# Patient Record
Sex: Male | Born: 1965
Health system: Southern US, Community
[De-identification: ages and names within clinical notes are randomized; demographics above are authoritative.]

## PROBLEM LIST (undated history)

## (undated) DIAGNOSIS — E785 Hyperlipidemia, unspecified: Secondary | ICD-10-CM

## (undated) DIAGNOSIS — I10 Essential (primary) hypertension: Secondary | ICD-10-CM

## (undated) DIAGNOSIS — E119 Type 2 diabetes mellitus without complications: Secondary | ICD-10-CM

## (undated) HISTORY — PX: OTHER SURGICAL HISTORY: SHX169

## (undated) HISTORY — PX: LEG SURGERY: SHX1003

---

## 1999-08-16 ENCOUNTER — Emergency Department (HOSPITAL_COMMUNITY): Admission: EM | Admit: 1999-08-16 | Discharge: 1999-08-16 | Payer: Self-pay | Admitting: Emergency Medicine

## 2000-12-12 ENCOUNTER — Emergency Department (HOSPITAL_COMMUNITY): Admission: EM | Admit: 2000-12-12 | Discharge: 2000-12-12 | Payer: Self-pay | Admitting: Emergency Medicine

## 2000-12-12 ENCOUNTER — Encounter: Payer: Self-pay | Admitting: Emergency Medicine

## 2004-05-31 ENCOUNTER — Ambulatory Visit: Payer: Self-pay | Admitting: Family Medicine

## 2005-06-17 ENCOUNTER — Ambulatory Visit: Payer: Self-pay | Admitting: Family Medicine

## 2006-08-07 ENCOUNTER — Emergency Department (HOSPITAL_COMMUNITY): Admission: EM | Admit: 2006-08-07 | Discharge: 2006-08-07 | Payer: Self-pay | Admitting: Emergency Medicine

## 2008-04-22 ENCOUNTER — Emergency Department (HOSPITAL_COMMUNITY): Admission: EM | Admit: 2008-04-22 | Discharge: 2008-04-22 | Payer: Self-pay | Admitting: Emergency Medicine

## 2009-04-11 ENCOUNTER — Emergency Department (HOSPITAL_COMMUNITY): Admission: EM | Admit: 2009-04-11 | Discharge: 2009-04-11 | Payer: Self-pay | Admitting: Emergency Medicine

## 2009-04-24 ENCOUNTER — Encounter: Payer: Self-pay | Admitting: Emergency Medicine

## 2009-04-24 ENCOUNTER — Encounter (INDEPENDENT_AMBULATORY_CARE_PROVIDER_SITE_OTHER): Payer: Self-pay | Admitting: Cardiovascular Disease

## 2009-04-24 ENCOUNTER — Observation Stay (HOSPITAL_COMMUNITY): Admission: AD | Admit: 2009-04-24 | Discharge: 2009-04-25 | Payer: Self-pay | Admitting: Cardiovascular Disease

## 2009-07-06 ENCOUNTER — Emergency Department (HOSPITAL_COMMUNITY): Admission: EM | Admit: 2009-07-06 | Discharge: 2009-07-06 | Payer: Self-pay | Admitting: Emergency Medicine

## 2009-07-08 ENCOUNTER — Emergency Department (HOSPITAL_COMMUNITY): Admission: EM | Admit: 2009-07-08 | Discharge: 2009-07-08 | Payer: Self-pay | Admitting: Family Medicine

## 2009-09-30 ENCOUNTER — Emergency Department (HOSPITAL_COMMUNITY): Admission: EM | Admit: 2009-09-30 | Discharge: 2009-09-30 | Payer: Self-pay | Admitting: Emergency Medicine

## 2010-10-18 LAB — POCT RAPID STREP A (OFFICE): Streptococcus, Group A Screen (Direct): NEGATIVE

## 2010-10-21 LAB — TROPONIN I

## 2010-10-21 LAB — COMPREHENSIVE METABOLIC PANEL WITH GFR
ALT: 36 U/L (ref 0–53)
AST: 50 U/L — ABNORMAL HIGH (ref 0–37)
Albumin: 4 g/dL (ref 3.5–5.2)
Alkaline Phosphatase: 74 U/L (ref 39–117)
BUN: 14 mg/dL (ref 6–23)
CO2: 27 meq/L (ref 19–32)
Calcium: 9.5 mg/dL (ref 8.4–10.5)
Chloride: 96 meq/L (ref 96–112)
Creatinine, Ser: 0.89 mg/dL (ref 0.4–1.5)
GFR calc non Af Amer: >60 mL/min
Glucose, Bld: 122 mg/dL — ABNORMAL HIGH (ref 70–99)
Potassium: 3.6 meq/L (ref 3.5–5.1)
Sodium: 134 meq/L — ABNORMAL LOW (ref 135–145)
Total Bilirubin: 0.9 mg/dL (ref 0.3–1.2)
Total Protein: 8.2 g/dL (ref 6.0–8.3)

## 2010-10-21 LAB — CARDIAC PANEL(CRET KIN+CKTOT+MB+TROPI)
CK, MB: 3.1 ng/mL (ref 0.3–4.0)
CK, MB: 4.1 ng/mL — ABNORMAL HIGH (ref 0.3–4.0)
Relative Index: 0.6 (ref 0.0–2.5)
Relative Index: 0.6 (ref 0.0–2.5)
Total CK: 553 U/L — ABNORMAL HIGH (ref 7–232)
Total CK: 648 U/L — ABNORMAL HIGH (ref 7–232)

## 2010-10-21 LAB — COMPREHENSIVE METABOLIC PANEL
ALT: 33 U/L (ref 0–53)
AST: 40 U/L — ABNORMAL HIGH (ref 0–37)
CO2: 31 mEq/L (ref 19–32)
Chloride: 99 mEq/L (ref 96–112)
Creatinine, Ser: 0.95 mg/dL (ref 0.4–1.5)
GFR calc Af Amer: >60 mL/min (ref >60)
GFR calc non Af Amer: >60 mL/min (ref >60)
Sodium: 139 mEq/L (ref 135–145)
Total Bilirubin: 1 mg/dL (ref 0.3–1.2)

## 2010-10-21 LAB — CK TOTAL AND CKMB (NOT AT ARMC)
CK, MB: 5.3 ng/mL — ABNORMAL HIGH (ref 0.3–4.0)
CK, MB: 7.3 ng/mL — ABNORMAL HIGH (ref 0.3–4.0)
Relative Index: 0.7 (ref 0.0–2.5)
Relative Index: 0.7 (ref 0.0–2.5)
Total CK: 1002 U/L — ABNORMAL HIGH (ref 7–232)
Total CK: 783 U/L — ABNORMAL HIGH (ref 7–232)

## 2010-10-21 LAB — LIPID PANEL
HDL: 66 mg/dL
Total CHOL/HDL Ratio: 3.7 ratio
Triglycerides: 99 mg/dL
VLDL: 20 mg/dL (ref 0–40)

## 2010-10-21 LAB — CBC
HCT: 43.1 % (ref 39.0–52.0)
Hemoglobin: 14.4 g/dL (ref 13.0–17.0)
MCHC: 33.5 g/dL (ref 30.0–36.0)
MCV: 79.3 fL (ref 78.0–100.0)
Platelets: 224 10*3/uL (ref 150–400)
Platelets: 277 10*3/uL (ref 150–400)
RBC: 5.43 MIL/uL (ref 4.22–5.81)
RDW: 15.2 % (ref 11.5–15.5)
RDW: 14.9 % (ref 11.5–15.5)
WBC: 6 10*3/uL (ref 4.0–10.5)

## 2010-10-21 LAB — HEMOGLOBIN A1C
Hgb A1c MFr Bld: 5.7 % (ref 4.6–6.1)
Mean Plasma Glucose: 117 mg/dL

## 2010-10-21 LAB — RAPID URINE DRUG SCREEN, HOSP PERFORMED
Amphetamines: NOT DETECTED
Barbiturates: NOT DETECTED
Benzodiazepines: NOT DETECTED
Cocaine: NOT DETECTED
Opiates: NOT DETECTED
Tetrahydrocannabinol: NOT DETECTED

## 2010-10-21 LAB — URINALYSIS, ROUTINE W REFLEX MICROSCOPIC
Glucose, UA: NEGATIVE mg/dL
Hgb urine dipstick: NEGATIVE
Leukocytes, UA: NEGATIVE
pH: 6 (ref 5.0–8.0)

## 2010-10-21 LAB — POCT CARDIAC MARKERS
CKMB, poc: 10.4 ng/mL (ref 1.0–8.0)
Myoglobin, poc: 175 ng/mL (ref 12–200)
Troponin i, poc: <0.05 ng/mL (ref 0.00–0.09)

## 2010-10-21 LAB — URINE MICROSCOPIC-ADD ON

## 2010-10-21 LAB — MAGNESIUM: Magnesium: 1.8 mg/dL (ref 1.5–2.5)

## 2010-10-21 LAB — DIFFERENTIAL
Basophils Relative: 1 % (ref 0–1)
Eosinophils Absolute: 0.1 10*3/uL (ref 0.0–0.7)
Eosinophils Relative: 1 % (ref 0–5)
Lymphocytes Relative: 40 % (ref 12–46)
Monocytes Absolute: 0.8 10*3/uL (ref 0.1–1.0)
Monocytes Relative: 10 % (ref 3–12)
Neutro Abs: 3.6 10*3/uL (ref 1.7–7.7)

## 2010-10-21 LAB — PROTIME-INR
INR: 0.93 (ref 0.00–1.49)
Prothrombin Time: 12.4 s (ref 11.6–15.2)

## 2011-03-28 ENCOUNTER — Inpatient Hospital Stay (INDEPENDENT_AMBULATORY_CARE_PROVIDER_SITE_OTHER)
Admission: RE | Admit: 2011-03-28 | Discharge: 2011-03-28 | Disposition: A | Discharge status: Home / Self Care | Payer: Self-pay | Source: Ambulatory Visit | Attending: Family Medicine | Admitting: Family Medicine

## 2011-03-28 DIAGNOSIS — T7840XA Allergy, unspecified, initial encounter: Secondary | ICD-10-CM

## 2011-04-18 LAB — DIFFERENTIAL
Basophils Absolute: 0
Basophils Relative: 0
Eosinophils Absolute: 0
Eosinophils Relative: 0
Lymphocytes Relative: 24
Lymphs Abs: 1.2
Monocytes Absolute: 0.5
Monocytes Relative: 11
Neutro Abs: 3.1
Neutrophils Relative %: 64

## 2011-04-18 LAB — COMPREHENSIVE METABOLIC PANEL
ALT: 84 — ABNORMAL HIGH
AST: 149 — ABNORMAL HIGH
Albumin: 3.5
Alkaline Phosphatase: 62
BUN: 10
CO2: 29
Calcium: 8.9
Chloride: 103
Creatinine, Ser: 0.87
GFR calc Af Amer: >60
GFR calc non Af Amer: >60
Glucose, Bld: 90
Potassium: 4.4
Sodium: 139
Total Bilirubin: 1.1
Total Protein: 6.7

## 2011-04-18 LAB — CBC
HCT: 42.3
Hemoglobin: 14.2
MCHC: 33.5
MCV: 80.6
Platelets: 193
RBC: 5.25
RDW: 15.2
WBC: 4.8

## 2011-04-18 LAB — LIPASE, BLOOD: Lipase: 18

## 2012-06-26 ENCOUNTER — Emergency Department (INDEPENDENT_AMBULATORY_CARE_PROVIDER_SITE_OTHER): Payer: Self-pay

## 2012-06-26 ENCOUNTER — Encounter (HOSPITAL_COMMUNITY): Payer: Self-pay | Admitting: Emergency Medicine

## 2012-06-26 ENCOUNTER — Emergency Department (INDEPENDENT_AMBULATORY_CARE_PROVIDER_SITE_OTHER)
Admission: EM | Admit: 2012-06-26 | Discharge: 2012-06-26 | Disposition: A | Discharge status: Home / Self Care | Payer: Self-pay | Source: Home / Self Care | Attending: Emergency Medicine | Admitting: Emergency Medicine

## 2012-06-26 DIAGNOSIS — J189 Pneumonia, unspecified organism: Secondary | ICD-10-CM

## 2012-06-26 HISTORY — DX: Essential (primary) hypertension: I10

## 2012-06-26 MED ORDER — CEFTRIAXONE SODIUM 1 G IJ SOLR
INTRAMUSCULAR | Status: AC
  Filled 2012-06-26: qty 10

## 2012-06-26 MED ORDER — CEFTRIAXONE SODIUM 1 G IJ SOLR
1.0000 g | Freq: Once | INTRAMUSCULAR | Status: AC
  Administered 2012-06-26: 1 g via INTRAMUSCULAR

## 2012-06-26 MED ORDER — AZITHROMYCIN 250 MG PO TABS: ORAL_TABLET | ORAL | Status: DC

## 2012-06-26 MED ORDER — BENZONATATE 200 MG PO CAPS: 200.0000 mg | ORAL_CAPSULE | Freq: Three times a day (TID) | ORAL | Status: DC | PRN

## 2012-06-26 MED ORDER — ACETAMINOPHEN 325 MG PO TABS
ORAL_TABLET | ORAL | Status: AC
  Filled 2012-06-26: qty 2

## 2012-06-26 NOTE — ED Notes (Signed)
Pt  Reports  Symptoms  Of  Fever    Body  Aches     As   Well  As       Chills   Shortness of  Breath and  A  Cough     -  Pt  Reports  Symptoms  X     3  Days    Pt   Took  Some  theraflu   This  Am       Pt  has  A  History  Of  htn            Yet  Takes  No  meds          Out of  [address]   In  [place]  Recently         And  He  Thinks  He  May  Have  Got it from them

## 2012-06-26 NOTE — ED Provider Notes (Signed)
Chief Complaint  Patient presents with  . Fever    History of Present Illness:   Riley Hood is a 45 year old male who has had a three-day history of fever, chills, myalgias, and cough productive yellow sputum. He feels somewhat short of breath, wheezing, tightness in his chest, and chest pain he also has had sore throat and some posttussive vomiting of mucus. He was exposed to a relative in [place] who had similar symptoms. He denies any nasal congestion or rhinorrhea. He has had no prior history of pneumonia.  Review of Systems:  Other than noted above, the patient denies any of the following symptoms. Systemic:  No fever, chills, sweats, fatigue, myalgias, headache, or anorexia. Eye:  No redness, pain or drainage. ENT:  No earache, ear congestion, nasal congestion, sneezing, rhinorrhea, sinus pressure, sinus pain, post nasal drip, or sore throat. Lungs:  No cough, sputum production, wheezing, shortness of breath, or chest pain. GI:  No abdominal pain, nausea, vomiting, or diarrhea.  PMFSH:  Past medical history, family history, social history, meds, and allergies were reviewed.  Physical Exam:   Vital signs:  BP 150/107  Pulse 120  Temp 102.9 F (39.4 C) (Oral)  Resp 30  SpO2 92% General:  Alert, in no distress. Eye:  No conjunctival injection or drainage. Lids were normal. ENT:  TMs and canals were normal, without erythema or inflammation.  Nasal mucosa was clear and uncongested, without drainage.  Mucous membranes were moist.  Pharynx was clear, without exudate or drainage.  There were no oral ulcerations or lesions. Neck:  Supple, no adenopathy, tenderness or mass. Lungs:  No respiratory distress.  Lungs were clear to auscultation, without wheezes, rales or rhonchi.  Breath sounds were clear and equal bilaterally.  Heart:  Regular rhythm, without gallops, murmers or rubs. Skin:  Clear, warm, and dry, without rash or lesions.  Radiology:  Dg Chest 2 View  06/26/2012  *RADIOLOGY  REPORT*  Clinical Data: Productive cough and fever  CHEST - 2 VIEW  Comparison: July 08, 2009.  Findings:  There is patchy infiltrate in the right middle lobe. There is also a small amount of infiltrate in the lingula. Elsewhere lungs clear.  Heart size and pulmonary vascularity normal.  No adenopathy.  No bone lesions.  IMPRESSION: Patchy infiltrate in the right middle lobe.  Small amount of patchy infiltrate in the lingula.   Original Report Authenticated By: Bretta Bang, M.D.    I reviewed the images independently and personally and concur with the radiologist's findings.  Assessment:  The encounter diagnosis was Community acquired pneumonia.  Plan:   1.  The following meds were prescribed:   New Prescriptions   AZITHROMYCIN (ZITHROMAX Z-PAK) 250 MG TABLET    Take as directed.   BENZONATATE (TESSALON) 200 MG CAPSULE    Take 1 capsule (200 mg total) by mouth 3 (three) times daily as needed for cough.   2.  The patient was instructed in symptomatic care and handouts were given. 3.  The patient was told to return if becoming worse in any way, for a recheck in 48 hours, and given some red flag symptoms that would indicate earlier return.   Reuben Likes, MD 06/26/12 1556

## 2015-05-25 ENCOUNTER — Encounter (HOSPITAL_COMMUNITY): Payer: Self-pay

## 2015-05-25 ENCOUNTER — Observation Stay (HOSPITAL_COMMUNITY)
Admission: EM | Admit: 2015-05-25 | Discharge: 2015-05-27 | Disposition: A | Discharge status: Home / Self Care | Payer: Self-pay | Attending: Cardiovascular Disease | Admitting: Cardiovascular Disease

## 2015-05-25 ENCOUNTER — Emergency Department (HOSPITAL_COMMUNITY): Payer: Self-pay

## 2015-05-25 ENCOUNTER — Encounter (HOSPITAL_COMMUNITY): Payer: Self-pay | Admitting: *Deleted

## 2015-05-25 ENCOUNTER — Emergency Department (INDEPENDENT_AMBULATORY_CARE_PROVIDER_SITE_OTHER)
Admission: EM | Admit: 2015-05-25 | Discharge: 2015-05-25 | Disposition: A | Discharge status: Home / Self Care | Payer: Self-pay | Source: Home / Self Care

## 2015-05-25 DIAGNOSIS — Z8249 Family history of ischemic heart disease and other diseases of the circulatory system: Secondary | ICD-10-CM | POA: Insufficient documentation

## 2015-05-25 DIAGNOSIS — E785 Hyperlipidemia, unspecified: Secondary | ICD-10-CM | POA: Insufficient documentation

## 2015-05-25 DIAGNOSIS — I10 Essential (primary) hypertension: Secondary | ICD-10-CM

## 2015-05-25 DIAGNOSIS — R7303 Prediabetes: Secondary | ICD-10-CM | POA: Insufficient documentation

## 2015-05-25 DIAGNOSIS — R079 Chest pain, unspecified: Secondary | ICD-10-CM

## 2015-05-25 DIAGNOSIS — R011 Cardiac murmur, unspecified: Secondary | ICD-10-CM | POA: Insufficient documentation

## 2015-05-25 DIAGNOSIS — I160 Hypertensive urgency: Secondary | ICD-10-CM

## 2015-05-25 LAB — BASIC METABOLIC PANEL
ANION GAP: 9 (ref 5–15)
BUN: 15 mg/dL (ref 6–20)
CHLORIDE: 102 mmol/L (ref 101–111)
CO2: 27 mmol/L (ref 22–32)
Calcium: 9.1 mg/dL (ref 8.9–10.3)
Creatinine, Ser: 0.82 mg/dL (ref 0.61–1.24)
GFR calc Af Amer: >60 mL/min (ref >60)
GLUCOSE: 105 mg/dL — AB (ref 65–99)
POTASSIUM: 3.8 mmol/L (ref 3.5–5.1)
Sodium: 138 mmol/L (ref 135–145)

## 2015-05-25 LAB — CBC WITH DIFFERENTIAL/PLATELET
Basophils Absolute: 0 10*3/uL (ref 0.0–0.1)
Basophils Relative: 1 %
EOS ABS: 0 10*3/uL (ref 0.0–0.7)
Eosinophils Relative: 1 %
HEMATOCRIT: 41.7 % (ref 39.0–52.0)
HEMOGLOBIN: 14.7 g/dL (ref 13.0–17.0)
LYMPHS ABS: 2.3 10*3/uL (ref 0.7–4.0)
Lymphocytes Relative: 42 %
MCH: 25.9 pg — AB (ref 26.0–34.0)
MCHC: 35.3 g/dL (ref 30.0–36.0)
MCV: 73.4 fL — ABNORMAL LOW (ref 78.0–100.0)
Monocytes Absolute: 0.4 10*3/uL (ref 0.1–1.0)
Monocytes Relative: 8 %
NEUTROS ABS: 2.7 10*3/uL (ref 1.7–7.7)
NEUTROS PCT: 48 %
Platelets: 241 10*3/uL (ref 150–400)
RBC: 5.68 MIL/uL (ref 4.22–5.81)
RDW: 15.4 % (ref 11.5–15.5)
WBC: 5.5 10*3/uL (ref 4.0–10.5)

## 2015-05-25 LAB — I-STAT TROPONIN, ED: Troponin i, poc: 0.01 ng/mL (ref 0.00–0.08)

## 2015-05-25 MED ORDER — ONDANSETRON HCL 4 MG/2ML IJ SOLN: 4.0000 mg | Freq: Four times a day (QID) | INTRAMUSCULAR | Status: DC | PRN

## 2015-05-25 MED ORDER — ACETAMINOPHEN 325 MG PO TABS
650.0000 mg | ORAL_TABLET | ORAL | Status: DC | PRN
  Administered 2015-05-26: 650 mg via ORAL
  Filled 2015-05-25: qty 2

## 2015-05-25 MED ORDER — ATORVASTATIN CALCIUM 80 MG PO TABS
80.0000 mg | ORAL_TABLET | Freq: Every day | ORAL | Status: DC
  Administered 2015-05-25 – 2015-05-27 (×3): 80 mg via ORAL
  Filled 2015-05-25 (×3): qty 1

## 2015-05-25 MED ORDER — ASPIRIN 300 MG RE SUPP: 300.0000 mg | RECTAL | Status: AC

## 2015-05-25 MED ORDER — NITROGLYCERIN 0.4 MG SL SUBL: 0.4000 mg | SUBLINGUAL_TABLET | SUBLINGUAL | Status: DC | PRN

## 2015-05-25 MED ORDER — ASPIRIN 81 MG PO CHEW
CHEWABLE_TABLET | ORAL | Status: AC
  Filled 2015-05-25: qty 4

## 2015-05-25 MED ORDER — INFLUENZA VAC SPLIT QUAD 0.5 ML IM SUSY
0.5000 mL | PREFILLED_SYRINGE | INTRAMUSCULAR | Status: AC
  Administered 2015-05-26: 0.5 mL via INTRAMUSCULAR
  Filled 2015-05-25: qty 0.5

## 2015-05-25 MED ORDER — ASPIRIN EC 81 MG PO TBEC
81.0000 mg | DELAYED_RELEASE_TABLET | Freq: Every day | ORAL | Status: DC
  Administered 2015-05-26 – 2015-05-27 (×2): 81 mg via ORAL
  Filled 2015-05-25 (×2): qty 1

## 2015-05-25 MED ORDER — ACETAMINOPHEN 500 MG PO TABS
1000.0000 mg | ORAL_TABLET | Freq: Once | ORAL | Status: AC
  Administered 2015-05-25: 1000 mg via ORAL
  Filled 2015-05-25: qty 2

## 2015-05-25 MED ORDER — SODIUM CHLORIDE 0.9 % IJ SOLN: 3.0000 mL | INTRAMUSCULAR | Status: DC | PRN

## 2015-05-25 MED ORDER — NITROGLYCERIN 0.4 MG SL SUBL
SUBLINGUAL_TABLET | SUBLINGUAL | Status: AC
  Filled 2015-05-25: qty 1

## 2015-05-25 MED ORDER — ASPIRIN 81 MG PO CHEW
324.0000 mg | CHEWABLE_TABLET | Freq: Once | ORAL | Status: AC
  Administered 2015-05-25: 324 mg via ORAL

## 2015-05-25 MED ORDER — SODIUM CHLORIDE 0.9 % IJ SOLN
3.0000 mL | Freq: Two times a day (BID) | INTRAMUSCULAR | Status: DC
  Administered 2015-05-25 – 2015-05-27 (×4): 3 mL via INTRAVENOUS

## 2015-05-25 MED ORDER — ASPIRIN 81 MG PO CHEW: 324.0000 mg | CHEWABLE_TABLET | ORAL | Status: AC

## 2015-05-25 MED ORDER — METOPROLOL TARTRATE 25 MG PO TABS
25.0000 mg | ORAL_TABLET | Freq: Two times a day (BID) | ORAL | Status: DC
  Administered 2015-05-25: 25 mg via ORAL
  Filled 2015-05-25: qty 1

## 2015-05-25 MED ORDER — SODIUM CHLORIDE 0.9 % IV SOLN
Freq: Once | INTRAVENOUS | Status: AC
  Administered 2015-05-25: 19:00:00 via INTRAVENOUS

## 2015-05-25 MED ORDER — NITROGLYCERIN 2 % TD OINT
1.0000 [in_us] | TOPICAL_OINTMENT | Freq: Once | TRANSDERMAL | Status: AC
  Administered 2015-05-25: 1 [in_us] via TOPICAL
  Filled 2015-05-25: qty 1

## 2015-05-25 MED ORDER — HYDRALAZINE HCL 20 MG/ML IJ SOLN
10.0000 mg | Freq: Four times a day (QID) | INTRAMUSCULAR | Status: DC | PRN
  Administered 2015-05-25 – 2015-05-27 (×3): 10 mg via INTRAVENOUS
  Filled 2015-05-25 (×4): qty 1

## 2015-05-25 MED ORDER — ASPIRIN 81 MG PO CHEW
324.0000 mg | CHEWABLE_TABLET | Freq: Once | ORAL | Status: DC
Start: 2015-05-25 — End: 2015-05-25

## 2015-05-25 MED ORDER — SODIUM CHLORIDE 0.9 % IV SOLN: 250.0000 mL | INTRAVENOUS | Status: DC | PRN

## 2015-05-25 NOTE — ED Provider Notes (Signed)
CSN: [id number]     Arrival date & time 05/25/15  1932 History   First MD Initiated Contact with Patient 05/25/15 1946     Chief Complaint  Patient presents with  . Chest Pain     (Consider location/radiation/quality/duration/timing/severity/associated sxs/prior Treatment) HPI  49 year old male presents with chest pain on and off for past 5 days. Started at work, broke out into a cold sweat. Pain is sharp in nature. 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 Is mostly in his left chest. Had shortness of breath once but typically has not been short of breath. No nausea or vomiting. No further sweating. The worse of the pain was over the first couple days, still having pain but has been unable to see a doctor until today. Went to urgent care where he was given 3-4 mg aspirin and one nitroglycerin. Chest pain now completely resolved. Now he has a headache. Has a history of hypertension, not currently on medicines. No leg swelling.   Past Medical History  Diagnosis Date  . Hypertension    History reviewed. No pertinent past surgical history. History reviewed. No pertinent family history. Social History  Substance Use Topics  . Smoking status: Never Smoker   . Smokeless tobacco: None  . Alcohol Use: No    Review of Systems  Constitutional: Positive for diaphoresis. Negative for fever.  Respiratory: Positive for shortness of breath.   Cardiovascular: Positive for chest pain. Negative for leg swelling.  Gastrointestinal: Negative for nausea and vomiting.      Allergies  Review of patient's allergies indicates no known allergies.  Home Medications   Prior to Admission medications   Medication Sig Start Date End Date Taking? Authorizing Provider  azithromycin (ZITHROMAX Z-PAK) 250 MG tablet Take as directed. 06/26/12   Harden Mo, MD  benzonatate (TESSALON) 200 MG capsule Take 1 capsule (200 mg total) by mouth 3 (three) times daily as needed for cough. 06/26/12   Harden Mo, MD   BP 182/93 mmHg  Pulse 80  Temp(Src) 99 F (37.2 C) (Oral)  SpO2 99% Physical Exam  Constitutional: He is oriented to person, place, and time. He appears well-developed and well-nourished. No distress.  HENT:  Head: Normocephalic and atraumatic.  Right Ear: External ear normal.  Left Ear: External ear normal.  Nose: Nose normal.  Eyes: Right eye exhibits no discharge. Left eye exhibits no discharge.  Neck: Neck supple.  Cardiovascular: Normal rate, regular rhythm, normal heart sounds and intact distal pulses.   Pulmonary/Chest: Effort normal and breath sounds normal. He exhibits no tenderness.  Abdominal: Soft. He exhibits no distension. There is no tenderness.  Musculoskeletal: He exhibits no edema.  Neurological: He is alert and oriented to person, place, and time.  Skin: Skin is warm and dry. He is not diaphoretic.  Nursing note and vitals reviewed.   ED Course  Procedures (including critical care time) Labs Review Labs Reviewed  BASIC METABOLIC PANEL - Abnormal; Notable for the following:    Glucose, Bld 105 (*)    All other components within normal limits  CBC WITH DIFFERENTIAL/PLATELET - Abnormal; Notable for the following:    MCV 73.4 (*)    MCH 25.9 (*)    All other components within normal limits  PROTIME-INR  APTT  TSH  TROPONIN I  TROPONIN I  TROPONIN I  HEMOGLOBIN A1C  BRAIN NATRIURETIC PEPTIDE  BASIC METABOLIC PANEL  LIPID PANEL  CBC  I-STAT TROPOININ, ED  I-STAT CHEM 8,  ED    Imaging Review Dg Chest Port 1 View  05/25/2015  CLINICAL DATA:  Left-sided chest pain since Friday. EXAM: PORTABLE CHEST 1 VIEW COMPARISON:  06/26/2012 FINDINGS: The cardiac silhouette, mediastinal and hilar contours are within normal limits and stable given the AP projection and portable technique. Low lung volumes with vascular crowding and streaky atelectasis but no infiltrates, edema or effusions. The bony thorax is intact. IMPRESSION: Low lung volumes with  vascular crowding and streaky atelectasis. No infiltrates, edema or effusions. Electronically Signed   By: Marijo Sanes M.D.   On: 05/25/2015 20:35   I have personally reviewed and evaluated these images and lab results as part of my medical decision-making.   EKG Interpretation   Date/Time:  Monday May 25 2015 19:40:03 EST Ventricular Rate:  70 PR Interval:  191 QRS Duration: 101 QT Interval:  385 QTC Calculation: 415 R Axis:   -83 Text Interpretation:  Sinus rhythm Left anterior fascicular block Abnormal  R-wave progression, late transition ST elevation in AVL only changes noted  compared to earlier in the day Confirmed by Lequisha Cammack  MD, Zaccai Chavarin ([id number]) on  05/26/2015 12:46:29 AM      MDM   Final diagnoses:  Chest pain, unspecified chest pain type    8:05 PM Reviewed ECG with Dr. Irish Lack, who feels while this is is different it is not diagnostic of STEMI, with only 1 lead elevated (AVL) and now pain free. Plan to see what troponin level is. Patient will need admission either way.  Patient's chest pain has not recurred. Initial troponin unremarkable. Difficult to tell given how long his symptoms been going on if this is after an MI or indicated of no cardiac damage. Likely his significant hypertension is playing a role. Has come down some with nitroglycerin by urgent care, Nitropaste applied here. Cardiology, Dr. Claiborne Billings, has seen patient and plans to admit.      Sherwood Gambler, MD 05/26/15 [phone]

## 2015-05-25 NOTE — H&P (Signed)
Riley Hood is an 49 y.o. male.    Chief Complaint: chest pain Primary cardiologist: Riley Hood  HPI: Riley Hood is a 49 yo man with PMH of hypertension who presents with chest pain that has been going on/off since last Friday, 11/4. 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 He characterized the pain as sharp with some pressure. He did notice associated diaphoresis and some mild nausea. He thought it was related to a hamburger and it improved mildly with rolaids. Since then the pain has recurred intermittently over the weekly, more with exertion as he largely rested all weekend. He continued to have discomfort so he sought care. He was seen in urgent care and ECG discussed with Dr. Irish Hood. He had an ECG change with some subtle ST depression and isolated 0.5 mm ST elevation discussed with Dr. Irish Hood (interventionalist on call) but given Hood of concomitant chest pain and nondiagnostic ECG changes no code STEMI activated. He is currently chest pain free. His pain resolved very quickly after SL NTG. He then developed a slight headache. He was also noted to have hypertension. He denies recent weight loss/gain. No upcoming surgeries. No dark stools or bright red blood per rectum. He does note some intermittent diarrhea.  Never smoker. Mother still alive with CAD. Father deceased but no CAD known.  Past Medical History  Diagnosis Date  . Hypertension     History reviewed. No pertinent past surgical history. Family history: no known CAD History reviewed. No pertinent family history. Social History:  reports that he has never smoked. He does not have any smokeless tobacco history on file. He reports that he does not drink alcohol. His drug history is not on file.  Allergies: No Known Allergies   (Not in a hospital admission)  Results for orders placed or performed during the hospital encounter of 05/25/15 (from the past 48 hour(s))  CBC with Differential      Status: Abnormal   Collection Time: 05/25/15  8:17 PM  Result Value Ref Range   WBC 5.5 4.0 - 10.5 K/uL   RBC 5.68 4.22 - 5.81 MIL/uL   Hemoglobin 14.7 13.0 - 17.0 g/dL   HCT 41.7 39.0 - 52.0 %   MCV 73.4 (L) 78.0 - 100.0 fL   MCH 25.9 (L) 26.0 - 34.0 pg   MCHC 35.3 30.0 - 36.0 g/dL   RDW 15.4 11.5 - 15.5 %   Platelets 241 150 - 400 K/uL   Neutrophils Relative % 48 %   Neutro Abs 2.7 1.7 - 7.7 K/uL   Lymphocytes Relative 42 %   Lymphs Abs 2.3 0.7 - 4.0 K/uL   Monocytes Relative 8 %   Monocytes Absolute 0.4 0.1 - 1.0 K/uL   Eosinophils Relative 1 %   Eosinophils Absolute 0.0 0.0 - 0.7 K/uL   Basophils Relative 1 %   Basophils Absolute 0.0 0.0 - 0.1 K/uL  I-stat troponin, ED     Status: None   Collection Time: 05/25/15  8:21 PM  Result Value Ref Range   Troponin i, poc 0.01 0.00 - 0.08 ng/mL   Comment 3            Comment: Due to the release kinetics of cTnI, a negative result within the first hours of the onset of symptoms does not rule out myocardial infarction with certainty. If myocardial infarction is still suspected, repeat the test at appropriate intervals.    Dg Chest Port 1 View  05/25/2015  CLINICAL DATA:  Left-sided chest pain since Friday. EXAM: PORTABLE CHEST 1 VIEW COMPARISON:  06/26/2012 FINDINGS: The cardiac silhouette, mediastinal and hilar contours are within normal limits and stable given the AP projection and portable technique. Low lung volumes with vascular crowding and streaky atelectasis but no infiltrates, edema or effusions. The bony thorax is intact. IMPRESSION: Low lung volumes with vascular crowding and streaky atelectasis. No infiltrates, edema or effusions. Electronically Signed   By: Marijo Sanes M.D.   On: 05/25/2015 20:35    Review of Systems  Constitutional: Positive for diaphoresis. Negative for fever, chills, weight loss and malaise/fatigue.  HENT: Negative for ear pain and tinnitus.   Eyes: Negative for double vision, photophobia and  discharge.  Respiratory: Negative for cough, hemoptysis and sputum production.   Cardiovascular: Positive for chest pain. Negative for orthopnea, claudication, leg swelling and PND.  Gastrointestinal: Positive for heartburn, nausea and diarrhea. Negative for vomiting, abdominal pain, constipation, blood in stool and melena.  Genitourinary: Negative for dysuria, frequency and hematuria.  Musculoskeletal: Negative for myalgias, joint pain and neck pain.  Skin: Negative for rash.  Neurological: Negative for dizziness, tingling, tremors and sensory change.  Endo/Heme/Allergies: Negative for polydipsia. Does not bruise/bleed easily.  Psychiatric/Behavioral: Negative for suicidal ideas, hallucinations and substance abuse.    Blood pressure 190/101, pulse 88, temperature 97.8 F (36.6 C), temperature source Oral, resp. rate 24, SpO2 97 %. Physical Exam  Nursing note and vitals reviewed. Constitutional: He is oriented to person, place, and time. He appears well-developed and well-nourished. No distress.  HENT:  Head: Normocephalic and atraumatic.  Nose: Nose normal.  Mouth/Throat: Oropharynx is clear and moist. No oropharyngeal exudate.  Eyes: Conjunctivae and EOM are normal. Pupils are equal, round, and reactive to light. No scleral icterus.  Neck: Normal range of motion. Neck supple. No JVD present. No tracheal deviation present.  Cardiovascular: Normal rate, regular rhythm and intact distal pulses.  Exam reveals no gallop and no friction rub.   Murmur heard. Soft systolic murmur noted at LLSB and apex  Respiratory: Effort normal and breath sounds normal. No respiratory distress. He has no wheezes. He has no rales.  GI: Soft. Bowel sounds are normal. He exhibits no distension. There is no tenderness. There is no rebound.  Musculoskeletal: Normal range of motion. He exhibits no edema or tenderness.  Neurological: He is alert and oriented to person, place, and time. No cranial nerve deficit.  Coordination normal.  Skin: Skin is warm and dry. No rash noted. He is not diaphoretic. No erythema.  Psychiatric: He has a normal mood and affect. His behavior is normal. Thought content normal.    Labs pending; h/h 14.7/41.7, wbc 5.5, plt 241, top 0.01 EKG #1 reviewed; SR, LAE, LAFB, LVH with repolarization #2 EKG reviewed; SR, sub mm (0.5) in aVL, otherwise, SR, LAE, LVH with repolarization and some subtle lateral/inferior TWI Echo: 10/10: EF 60-65%, grade I DD, mild AI, mild TR, mild LVH  Assessment/Plan Mr. Eschmann is a 49 yo man with PMH of hypertension who presents with 4-5 days of intermittent chest discomfort. Differential diagnosis is musculoskeletal pain, esophageal spasm, GERD, pericarditis, ACS/NSTEMI among other etiologies. I favor a diagnosis of typical chest pain with exertional chest pain that improved with SL NTG and some component of pressure (also sharp). He is currently chest pain free in setting of hypertension. At this time, will not start heparin given 4-5 days of chest pain intermittently, currently chest pain free and negative troponin. If recurrent pain, will  start heparin gtt. Will aim to control blood pressure as first target. I favor LHC in AM given abnormal ECG and typical features of chest pain.  Problem List Chest Pain Hypertensive urgency Hypertension Plan: - NPO after MN - trend cardiac markers - anticipate LHC in AM, discussed with pain - observation on telemetry - asa 81 mg, atorvastatin 80 mg qHS, first dose now - control blood pressure, NTG and metoprolol, hydralazine PRN - hba1c, tsh, lipid panel, BNP - echocardiogram   Bennie Scaff 05/25/2015, 8:51 PM

## 2015-05-25 NOTE — ED Notes (Signed)
Called lab to get update on results, stated machine was down and would be a while

## 2015-05-25 NOTE — Progress Notes (Signed)
PATIENT ARRIVED TO UNIT 2W FROM E.D. VIA STRETCHER. AMBULATED TO BED. TELE APPLIED. VITALS OBTAINED. ASSESSMENT COMPLETED.  PATIENT ORIENTED TO UNIT AND EQUIPMENT. INSTRUCTED TO CALL FOR ASSISTANCE WHEN NEEDED. 

## 2015-05-25 NOTE — ED Notes (Signed)
Per [facility]: Pt coming from Urgent Care, began experiencing L sided chest pain Friday. Pt states sharp pain. UC gave 325 ASA and 1 nitro. [facility] gave a second nitro. Pt reports pain-free at this time. BP 190/86, HR 70, 98% RA. Hx: htn, no medications.

## 2015-05-25 NOTE — ED Notes (Signed)
Pt reports    Chest  Pain  Cardiac  Monitor  And   Nasal  o2   At  2  l  /  Min         Iv  Ns  tko  20  Angio  r  Arm

## 2015-05-25 NOTE — ED Provider Notes (Signed)
CSN: [id number]     Arrival date & time 05/25/15  1738 History   None    Chief Complaint  Patient presents with  . Chest Pain   (Consider location/radiation/quality/duration/timing/severity/associated sxs/prior Treatment) Patient is a 49 y.o. male presenting with chest pain. The history is provided by the patient.  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 Hypertension    History reviewed. No pertinent past surgical history. History reviewed. No pertinent family history. Social History  Substance Use Topics  . Smoking status: Never Smoker   . Smokeless tobacco: None  . Alcohol Use: No    Review of Systems  Constitutional: Positive for diaphoresis.  Respiratory: Negative for shortness of breath.   Cardiovascular: Positive for chest pain. Negative for palpitations and leg swelling.  Gastrointestinal: Positive for nausea. Negative for heartburn, vomiting and abdominal pain.  All other systems reviewed and are negative.   Allergies  Review of patient's allergies indicates no known allergies.  Home Medications   Prior to Admission medications   Medication Sig Start Date End Date Taking? Authorizing Provider  azithromycin (ZITHROMAX Z-PAK) 250 MG tablet Take as directed. 06/26/12   Harden Mo, MD  benzonatate (TESSALON) 200 MG capsule Take 1 capsule (200 mg total) by mouth 3 (three) times  daily as needed for cough. 06/26/12   Harden Mo, MD   Meds Ordered and Administered this Visit   Medications  aspirin chewable tablet 324 mg (not administered)  0.9 %  sodium chloride infusion (not administered)  aspirin chewable tablet 324 mg (324 mg Oral Given 05/25/15 1900)    BP 185/106 mmHg  Pulse 86  Temp(Src) 98.4 F (36.9 C) (Oral)  SpO2 98% No data found.   Physical Exam  Constitutional: He is oriented to person, place, and time. He appears well-developed and well-nourished.  Eyes: Conjunctivae are normal. Pupils are equal, round, and reactive to light.  Neck: Normal range of motion. Neck supple.  Cardiovascular: Normal rate, regular rhythm, intact distal pulses and normal pulses.   Murmur heard.  Systolic murmur is present with a grade of 3/6  Pulmonary/Chest: Effort normal and breath sounds normal.  Neurological: He is alert and oriented to person, place, and time.  Skin: Skin is warm and dry.  Nursing note and vitals reviewed.   ED Course  Procedures (including critical care time)  Labs Review Labs Reviewed - No data to display ED ECG REPORT   Date: 05/25/2015  Rate:86  Rhythm: normal sinus rhythm  QRS Axis: left  Intervals: normal  ST/T Wave abnormalities: nonspecific ST/T changes, ST depressions laterally and early repolarization  Conduction Disutrbances:none and left anterior fascicular block  Narrative Interpretation:   Old EKG Reviewed: none available  I have personally reviewed the EKG tracing and agree with the computerized printout as noted.  Imaging Review No results found.   Visual Acuity Review  Right Eye  Distance:   Left Eye Distance:   Bilateral Distance:    Right Eye Near:   Left Eye Near:    Bilateral Near:         MDM   1. Chest pain with high risk for cardiac etiology    Discussed with dr Irish Lack who reviewed ekg and does not feel code stemi but does warrant enzyme eval. Will send to ER., will give asa and  ntg.    Billy Fischer, MD 05/25/15 Drema Halon

## 2015-05-25 NOTE — ED Notes (Addendum)
Pt  Reports chest   Pain  X  3  Days        Short  Of  Breath  Earlier            Had    Chest  Pain   Has   A  History         Of       Sleep apnea    And  High  Cholesterol

## 2015-05-26 ENCOUNTER — Observation Stay (HOSPITAL_BASED_OUTPATIENT_CLINIC_OR_DEPARTMENT_OTHER): Payer: Self-pay

## 2015-05-26 ENCOUNTER — Ambulatory Visit (HOSPITAL_BASED_OUTPATIENT_CLINIC_OR_DEPARTMENT_OTHER): Payer: Self-pay

## 2015-05-26 DIAGNOSIS — R079 Chest pain, unspecified: Secondary | ICD-10-CM

## 2015-05-26 DIAGNOSIS — I160 Hypertensive urgency: Secondary | ICD-10-CM

## 2015-05-26 DIAGNOSIS — I10 Essential (primary) hypertension: Secondary | ICD-10-CM

## 2015-05-26 LAB — BRAIN NATRIURETIC PEPTIDE: B Natriuretic Peptide: 18.1 pg/mL (ref 0.0–100.0)

## 2015-05-26 LAB — PROTIME-INR
INR: 1.12 (ref 0.00–1.49)
PROTHROMBIN TIME: 14.5 s (ref 11.6–15.2)

## 2015-05-26 LAB — BASIC METABOLIC PANEL WITH GFR
Anion gap: 9 (ref 5–15)
BUN: 11 mg/dL (ref 6–20)
CO2: 28 mmol/L (ref 22–32)
Calcium: 9 mg/dL (ref 8.9–10.3)
Chloride: 99 mmol/L — ABNORMAL LOW (ref 101–111)
Creatinine, Ser: 0.82 mg/dL (ref 0.61–1.24)
GFR calc Af Amer: >60 mL/min
GFR calc non Af Amer: >60 mL/min
Glucose, Bld: 115 mg/dL — ABNORMAL HIGH (ref 65–99)
Potassium: 3.8 mmol/L (ref 3.5–5.1)
Sodium: 136 mmol/L (ref 135–145)

## 2015-05-26 LAB — TSH: TSH: 1.144 u[IU]/mL (ref 0.350–4.500)

## 2015-05-26 LAB — CBC
HCT: 40.1 % (ref 39.0–52.0)
Hemoglobin: 14 g/dL (ref 13.0–17.0)
MCH: 25.4 pg — ABNORMAL LOW (ref 26.0–34.0)
MCHC: 34.9 g/dL (ref 30.0–36.0)
MCV: 72.8 fL — ABNORMAL LOW (ref 78.0–100.0)
Platelets: 242 10*3/uL (ref 150–400)
RBC: 5.51 MIL/uL (ref 4.22–5.81)
RDW: 15.3 % (ref 11.5–15.5)
WBC: 5.4 10*3/uL (ref 4.0–10.5)

## 2015-05-26 LAB — LIPID PANEL
Cholesterol: 212 mg/dL — ABNORMAL HIGH (ref 0–200)
HDL: 43 mg/dL
LDL Cholesterol: UNDETERMINED mg/dL (ref 0–99)
Total CHOL/HDL Ratio: 4.9 ratio
Triglycerides: 772 mg/dL — ABNORMAL HIGH
VLDL: UNDETERMINED mg/dL (ref 0–40)

## 2015-05-26 LAB — TROPONIN I
Troponin I: <0.03 ng/mL
Troponin I: <0.03 ng/mL (ref <0.031)
Troponin I: <0.03 ng/mL

## 2015-05-26 LAB — APTT: aPTT: 29 s (ref 24–37)

## 2015-05-26 LAB — D-DIMER, QUANTITATIVE (NOT AT ARMC): D DIMER QUANT: 0.39 ug{FEU}/mL (ref 0.00–0.48)

## 2015-05-26 MED ORDER — REGADENOSON 0.4 MG/5ML IV SOLN
0.4000 mg | Freq: Once | INTRAVENOUS | Status: AC
  Administered 2015-05-26: 0.4 mg via INTRAVENOUS
  Filled 2015-05-26: qty 5

## 2015-05-26 MED ORDER — REGADENOSON 0.4 MG/5ML IV SOLN
INTRAVENOUS | Status: AC
  Administered 2015-05-26: 0.4 mg via INTRAVENOUS
  Filled 2015-05-26: qty 5

## 2015-05-26 MED ORDER — AMLODIPINE BESYLATE 10 MG PO TABS
10.0000 mg | ORAL_TABLET | Freq: Every day | ORAL | Status: DC
  Administered 2015-05-27: 10 mg via ORAL
  Filled 2015-05-26: qty 1

## 2015-05-26 MED ORDER — METOPROLOL TARTRATE 50 MG PO TABS
50.0000 mg | ORAL_TABLET | Freq: Two times a day (BID) | ORAL | Status: DC
  Administered 2015-05-26 – 2015-05-27 (×4): 50 mg via ORAL
  Filled 2015-05-26 (×4): qty 1

## 2015-05-26 MED ORDER — REGADENOSON 0.4 MG/5ML IV SOLN
0.4000 mg | Freq: Once | INTRAVENOUS | Status: AC
  Administered 2015-05-26: 0.4 mg via INTRAVENOUS

## 2015-05-26 MED ORDER — LABETALOL HCL 5 MG/ML IV SOLN: 10.0000 mg | INTRAVENOUS | Status: DC | PRN

## 2015-05-26 MED ORDER — AMLODIPINE BESYLATE 5 MG PO TABS
5.0000 mg | ORAL_TABLET | Freq: Every day | ORAL | Status: DC
  Administered 2015-05-26: 5 mg via ORAL
  Filled 2015-05-26: qty 1

## 2015-05-26 NOTE — Progress Notes (Signed)
Nitro patch removed in IR for [employer] at 1135.   Babs Bertin RN

## 2015-05-26 NOTE — Care Management Note (Addendum)
Case Management Note  Patient Details  Name: Riley Hood MRN: [id number] Date of Birth: Jul 18, 1966  Subjective/Objective:  Pt was admitted with CP                  Action/Plan:  Pt is independent from home.  Pt confirmed he does not have insurance nor PCP, pt agreed for CM to set up initial appt with PCP.   Expected Discharge Date:  05/27/15               Expected Discharge Plan:  Home/Self Care  In-House Referral:     Discharge planning Services  CM Consult, [facility]  Post Acute Care Choice:    Choice offered to:     DME Arranged:    DME Agency:     HH Arranged:    HH Agency:     Status of Service:  In process, will continue to follow  Medicare Important Message Given:    Date Medicare IM Given:    Medicare IM give by:    Date Additional Medicare IM Given:    Additional Medicare Important Message give by:     If discussed at [place] of Stay Meetings, dates discussed:    Additional Comments: CM assessed pt; CM set up appt with [facility] for 06/05/15 at 2pm.  CM provided clinic brochure with appt specifics on it, and provided appt info on AVS.  CM informed pt that he will be able to get medications filled immediately post discharge at pharmacy clinic Maryclare Labrador, RN 05/26/2015, 3:59 PM

## 2015-05-26 NOTE — Progress Notes (Signed)
Patient sitting on side of the bed, son and grandchildren present. No pain or distress, call light within reach.

## 2015-05-26 NOTE — Progress Notes (Signed)
Patient's BP only went down from 197/118 to 175/113. Prn dose of hydralazine given, will continue to monitor.Call light within reach
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
## 2015-05-26 NOTE — Progress Notes (Signed)
DR. Claiborne Billings (CARDIOLOGY) NOTIFIED OF PATIENT'S HYPERTENSION AND INTERVENTIONS.

## 2015-05-26 NOTE — Progress Notes (Signed)
  Echocardiogram 2D Echocardiogram has been performed.  Jennette Dubin 05/26/2015, 2:43 PM

## 2015-05-27 ENCOUNTER — Encounter (HOSPITAL_COMMUNITY): Payer: Self-pay

## 2015-05-27 ENCOUNTER — Encounter (HOSPITAL_COMMUNITY): Payer: MEDICAID

## 2015-05-27 LAB — NM MYOCAR MULTI W/SPECT W/WALL MOTION / EF
CHL CUP RESTING HR STRESS: 79 {beats}/min
CSEPED: 5 min
Estimated workload: 1 METS
MPHR: 171 {beats}/min
Peak HR: 107 {beats}/min
Percent HR: 62 %

## 2015-05-27 LAB — HEMOGLOBIN A1C
Hgb A1c MFr Bld: 6.3 % — ABNORMAL HIGH (ref 4.8–5.6)
MEAN PLASMA GLUCOSE: 134 mg/dL

## 2015-05-27 MED ORDER — AMLODIPINE BESYLATE 10 MG PO TABS: 10.0000 mg | ORAL_TABLET | Freq: Every day | ORAL | Status: DC

## 2015-05-27 MED ORDER — TECHNETIUM TC 99M SESTAMIBI GENERIC - CARDIOLITE
30.0000 | Freq: Once | INTRAVENOUS | Status: AC | PRN
  Administered 2015-05-27: 30 via INTRAVENOUS

## 2015-05-27 MED ORDER — ASPIRIN 81 MG PO TBEC: 81.0000 mg | DELAYED_RELEASE_TABLET | Freq: Every day | ORAL | Status: DC

## 2015-05-27 MED ORDER — ATORVASTATIN CALCIUM 80 MG PO TABS: 80.0000 mg | ORAL_TABLET | Freq: Every day | ORAL | Status: DC

## 2015-05-27 MED ORDER — CHLORTHALIDONE 25 MG PO TABS: 25.0000 mg | ORAL_TABLET | Freq: Every day | ORAL | Status: DC

## 2015-05-27 MED ORDER — METOPROLOL TARTRATE 50 MG PO TABS: 50.0000 mg | ORAL_TABLET | Freq: Two times a day (BID) | ORAL | Status: DC

## 2015-05-27 MED ORDER — CHLORTHALIDONE 25 MG PO TABS
25.0000 mg | ORAL_TABLET | Freq: Every day | ORAL | Status: DC
  Administered 2015-05-27: 25 mg via ORAL
  Filled 2015-05-27 (×2): qty 1

## 2015-05-27 NOTE — Progress Notes (Signed)
The patient was discharged home with family. I went over the medications and discharge plans which he stated he understood and was able to teach back what he learned. Kathryne Sharper, RN

## 2015-05-27 NOTE — Discharge Summary (Signed)
CARDIOLOGY DISCHARGE SUMMARY   Patient ID: Riley Hood MRN: [id number] DOB/AGE: 09/02/1965 49 y.o.  Admit date: 05/25/2015 Discharge date: 05/27/2015  PCP: No PCP Per Patient Primary Cardiologist: Dr. Claiborne Billings  Primary Discharge Diagnosis: Chest pain with moderate risk for cardiac etiology Secondary Discharge Diagnosis: Hypertension, Hypertensive urgency  Consults: None  Procedures: Nuclear Lexiscan Myoview, Transesophageal Echocardiogram   Hospital Course: Riley Hood is a 49 y.o. male with past medical history of HTN who presented to [facility] ED on 05/25/2015 for chest pain episodes which had been occurring for the past 3 days.   The pain was most noticeable at work when lifting heavy objects and with walking long distances. It was associated with nausea and diaphoresis and he described it as a sharp pain.   His BP was noted to be in the 190's while in the ED and he was placed on Metoprolol 25mg  BID and Amlodipine 5mg  daily, in addition to Hydralazine IV PRN.   On the morning of 05/26/2015, he denied any recent chest pain or associated anginal symptoms. Cyclic troponin values and a d-dimer were negative. A1c was elevated to 6.3, consistent with pre-diabetes. His lipid panel showed Total Cholesterol of 212 and Triglycerides of 772. His systolic BP was still noted to be elevated in the 150's - low 200's. His Amlodipine was increased to 10mg  daily and Metoprolol was increased to 50mg  BID. A Lexiscan Myoview was ordered to further evaluate his chest pain (2-day study due to patient's weight).    During his physical examinations, a 2/6 systolic murmur had been noted and an echocardiogram was ordered to further assess the patient's LVF and valvular anatomy.His echo showed an EF of 65-70%, no regional wall motion abnormalities, and no valvular dysfunction.  On the morning of 05/27/2015, he remained without chest pain but did have a mild headache. His BP had remained elevated to  148/70 - 198/119 in the past 24 hours. He was started on Chlorthalidone 25mg  daily for better BP control with no dose changes to his medications mentioned above.   His 2 day stress test was completed and showed a primarily fixed small, mild basal inferolateral perfusion defect and no significant ischemia. It was overall a low risk study.   The patient ambulated down the hallway with his nurse without any chest pain or anginal symptoms. He was last examined by Dr. Ellyn Hack and deemed stable for discharge. He was informed that if his chest pain represents itself, he will likely need a definitive cardiac catheterization at that time.  Cardiology hospital follow-up has been arranged for in 2 weeks. Prescriptions were sent in to the patient's preferred pharmacy. He was informed to call the office with any questions or concerns regarding his medications.  Labs:   Lab Results  Component Value Date   WBC 5.4 05/26/2015   HGB 14.0 05/26/2015   HCT 40.1 05/26/2015   MCV 72.8* 05/26/2015   PLT 242 05/26/2015     Recent Labs Lab 05/26/15 0416  NA 136  K 3.8  CL 99*  CO2 28  BUN 11  CREATININE 0.82  CALCIUM 9.0  GLUCOSE 115*    Recent Labs  05/26/15 0011 05/26/15 0416 05/26/15 1045  TROPONINI <0.03 <0.03 <0.03   Lipid Panel     Component Value Date/Time   CHOL 212* 05/26/2015 0416   TRIG 772* 05/26/2015 0416   HDL 43 05/26/2015 0416   CHOLHDL 4.9 05/26/2015 0416   VLDL UNABLE TO CALCULATE IF TRIGLYCERIDE  OVER 400 mg/dL 05/26/2015 0416   LDLCALC UNABLE TO CALCULATE IF TRIGLYCERIDE OVER 400 mg/dL 05/26/2015 0416    B NATRIURETIC PEPTIDE  Date/Time Value Ref Range Status  05/26/2015 12:11 AM 18.1 0.0 - 100.0 pg/mL Final   No results found for: PROBNP  Recent Labs  05/26/15 0011  INR 1.12      Radiology:  Nm Myocar Multi W/spect W/wall Motion / Ef: 05/27/2015   There was no ST segment deviation noted during stress.  This is a low risk study.  The left ventricular  ejection fraction is mildly decreased (45-54%).  Nuclear stress EF: 52%.  1. Primarily fixed small, mild basal inferolateral perfusion defect.  Cannot rule out prior infarction but may be attenuation.  No significant ischemia. 2. EF 52%, no regional wall motion abnormalities (mild global hypokinesis, would confirm with echo).   Dg Chest Port 1 View: 05/25/2015  CLINICAL DATA:  Left-sided chest pain since Friday. EXAM: PORTABLE CHEST 1 VIEW COMPARISON:  06/26/2012 FINDINGS: The cardiac silhouette, mediastinal and hilar contours are within normal limits and stable given the AP projection and portable technique. Low lung volumes with vascular crowding and streaky atelectasis but no infiltrates, edema or effusions. The bony thorax is intact. IMPRESSION: Low lung volumes with vascular crowding and streaky atelectasis. No infiltrates, edema or effusions. Electronically Signed   By: Marijo Sanes M.D.   On: 05/25/2015 20:35   Echo: 05/26/2015 Study Conclusions - Left ventricle: The cavity size was normal. There was mild concentric hypertrophy. Systolic function was vigorous. The estimated ejection fraction was in the range of 65% to 70%. Wall motion was normal; there were no regional wall motion abnormalities. Doppler parameters are consistent with abnormal left ventricular relaxation (grade 1 diastolic dysfunction).  FOLLOW UP PLANS AND APPOINTMENTS No Known Allergies   Medication List    TAKE these medications        amLODipine 10 MG tablet  Commonly known as:  NORVASC  Take 1 tablet (10 mg total) by mouth daily.     aspirin 81 MG EC tablet  Take 1 tablet (81 mg total) by mouth daily.     atorvastatin 80 MG tablet  Commonly known as:  LIPITOR  Take 1 tablet (80 mg total) by mouth daily at 6 PM.     azithromycin 250 MG tablet  Commonly known as:  ZITHROMAX Z-PAK  Take as directed.     benzonatate 200 MG capsule  Commonly known as:  TESSALON  Take 1 capsule (200 mg total) by  mouth 3 (three) times daily as needed for cough.     chlorthalidone 25 MG tablet  Commonly known as:  HYGROTON  Take 1 tablet (25 mg total) by mouth daily.     metoprolol 50 MG tablet  Commonly known as:  LOPRESSOR  Take 1 tablet (50 mg total) by mouth 2 (two) times daily.        Follow-up Information    Follow up with [facility] On 06/05/2015.   Why:  at 2 pm   Contact information:   [address] [place] [address] [phone]      Follow up with Tarri Fuller, PA-C On 06/15/2015.   Specialties:  Physician Assistant, Radiology, Interventional Cardiology   Why:  Cardiology Hospital Follow-Up on 06/15/2015 at 10:30AM   Contact information:   [address] [place] [address] [phone]       BRING ALL MEDICATIONS WITH YOU TO FOLLOW UP  APPOINTMENTS  Time spent with patient to include physician time: 40 minutes Signed: Erma Heritage, PA 05/27/2015, 5:02 PM Co-Sign MD     I have seen, examined and evaluated the patient this Am along with Mrs. Ahmed Prima, [place]. After reviewing all the available data and chart, I agree with her findings, examination as well as impression recommendations.  Patient no longer having any chest pain or dyspnea. Still having headaches. Blood pressure still inadequately controlled. I agree with adding diuretic. Would opt for chlorthalidone. Had second part of his Myoview performed this morning. Awaiting results. Echocardiogram was relatively normal. I didn't see anything to explain his murmur. Thankfully his EF is preserved with no significant LVH. This would argue against long-term myocardial damage.  His stress test was read as LOW RISK with possible fixed inferolateral defect. We stressed the importance of HTN control & medication adherence.  He was instructed to notify us & present to ER if necessary for recurrence of CP or worsening exertional dyspnea -- this would  suggest that the "defect" may in fact be truly indicative of CAD & not artifact.    Leonie Man, M.D., M.S. Interventional Cardiologist   Pager # [phone]

## 2015-05-27 NOTE — Progress Notes (Signed)
Patient Name: Riley Hood Date of Encounter: 05/27/2015  Principal Problem:   Chest pain with moderate risk for cardiac etiology Active Problems:   Hypertension   Hypertensive urgency   Primary Cardiologist: Dr. Claiborne Billings Patient Profile: 49 yo male with PMH of HTN who presented to [facility] on 05/25/2015 with 4-5 days of intermittent chest discomfort. Cyclic troponin values have been negative.   SUBJECTIVE: Reports still having frequent headaches. Denies any chest pain, palpitations, dizziness, lightheadedness, or dyspnea.  OBJECTIVE Filed Vitals:   05/26/15 2313 05/26/15 2314 05/27/15 0024 05/27/15 0626  BP: 172/114 175/113 148/81 177/119  Pulse:   97 73  Temp:    97.9 F (36.6 C)  TempSrc:    Oral  Resp:      Height:      Weight:    282 lb 6.6 oz (128.1 kg)  SpO2:    97%    Intake/Output Summary (Last 24 hours) at 05/27/15 1004 Last data filed at 05/27/15 0730  Gross per 24 hour  Intake    723 ml  Output      0 ml  Net    723 ml   Filed Weights   05/25/15 2259 05/26/15 0428 05/27/15 0626  Weight: 288 lb 12.8 oz (131 kg) 287 lb 14.4 oz (130.591 kg) 282 lb 6.6 oz (128.1 kg)    PHYSICAL EXAM General: Well developed, well nourished, male in no acute distress. Head: Normocephalic, atraumatic.  Neck: Supple without bruits, JVD not elevated. Lungs:  Resp regular and unlabored, CTA without wheezing or rales. Heart: Regular rhythm, tachycardiac rate, S1, S2, no S3, S4, 2/6 SEM loudest at apex; no rub. Abdomen: Soft, non-tender, non-distended with normoactive bowel sounds. No hepatomegaly. No rebound/guarding. No obvious abdominal masses. Extremities: No clubbing, cyanosis, or edema. Distal pedal pulses are 2+ bilaterally. Neuro: Alert and oriented X 3. Moves all extremities spontaneously. Psych: Normal affect.  LABS: CBC: Recent Labs  05/25/15 2017 05/26/15 0416  WBC 5.5 5.4  NEUTROABS 2.7  --   HGB 14.7 14.0  HCT 41.7 40.1  MCV 73.4* 72.8*  PLT 241  242   INR: Recent Labs  05/26/15 0011  INR 8.67   Basic Metabolic Panel: Recent Labs  05/25/15 2017 05/26/15 0416  NA 138 136  K 3.8 3.8  CL 102 99*  CO2 27 28  GLUCOSE 105* 115*  BUN 15 11  CREATININE 0.82 0.82  CALCIUM 9.1 9.0   Cardiac Enzymes: Recent Labs  05/26/15 0011 05/26/15 0416 05/26/15 1045  TROPONINI <0.03 <0.03 <0.03    Recent Labs  05/25/15 2021  TROPIPOC 0.01   BNP:  B NATRIURETIC PEPTIDE  Date/Time Value Ref Range Status  05/26/2015 12:11 AM 18.1 0.0 - 100.0 pg/mL Final   D-dimer: Recent Labs  05/26/15 1045  DDIMER 0.39   Hemoglobin A1C: Recent Labs  05/26/15 0011  HGBA1C 6.3*   Fasting Lipid Panel: Recent Labs  05/26/15 0416  CHOL 212*  HDL 43  LDLCALC UNABLE TO CALCULATE IF TRIGLYCERIDE OVER 400 mg/dL  TRIG 772*  CHOLHDL 4.9   Thyroid Function Tests: Recent Labs  05/26/15 0011  TSH 1.144   TELE: NSR with rate in 80's - low 120's. No atopic events.   ECG: NSR, rate in 80's. Known LAFB. LVH.    ECHO: 05/26/2015 Study Conclusions  - Left ventricle: The cavity size was normal. There was mild concentric hypertrophy. Systolic function was vigorous. The estimated ejection fraction was in the range of 65% to  70%. Wall motion was normal; there were no regional wall motion abnormalities. Doppler parameters are consistent with abnormal left ventricular relaxation (grade 1 diastolic dysfunction).  ------------------------------------------------------------------- Left ventricle: The cavity size was normal. There was mild concentric hypertrophy. Systolic function was vigorous. The estimated ejection fraction was in the range of 65% to 70%. Wall motion was normal; there were no regional wall motion abnormalities. Doppler parameters are consistent with abnormal left ventricular relaxation (grade 1 diastolic dysfunction). ------------------------------------------------------------------- Aortic valve:   Trileaflet; mildly thickened, mildly calcified leaflets. Mobility was not restricted. Doppler: Transvalvular velocity was within the normal range. There was no stenosis. There was no regurgitation. ------------------------------------------------------------------- Aorta: Aortic root: The aortic root was normal in size. ------------------------------------------------------------------- Mitral valve:  Structurally normal valve.  Mobility was not restricted. Doppler: Transvalvular velocity was within the normal range. There was no evidence for stenosis. There was no regurgitation.  Peak gradient (D): 3 mm Hg. ------------------------------------------------------------------- Left atrium: The atrium was normal in size. ------------------------------------------------------------------ Right ventricle: The cavity size was normal. Wall thickness was normal. Systolic function was normal. ------------------------------------------------------------------- Pulmonic valve:  Poorly visualized. Structurally normal valve. Cusp separation was normal. Doppler: Transvalvular velocity was within the normal range. There was no evidence for stenosis. There was no regurgitation. ------------------------------------------------------------------- Tricuspid valve:  Structurally normal valve.  Doppler: Transvalvular velocity was within the normal range. There was no regurgitation. ------------------------------------------------------------------ Pulmonary artery:  The main pulmonary artery was normal-sized. Systolic pressure was within the normal range. ------------------------------------------------------------------- Right atrium: The atrium was normal in size. ------------------------------------------------------------------- Pericardium: There was no pericardial effusion. ------------------------------------------------------------------- Systemic veins: Inferior vena cava:  The vessel was normal in size.  Radiology/Studies: Dg Chest Port 1 View: 05/25/2015  CLINICAL DATA:  Left-sided chest pain since Friday. EXAM: PORTABLE CHEST 1 VIEW COMPARISON:  06/26/2012 FINDINGS: The cardiac silhouette, mediastinal and hilar contours are within normal limits and stable given the AP projection and portable technique. Low lung volumes with vascular crowding and streaky atelectasis but no infiltrates, edema or effusions. The bony thorax is intact. IMPRESSION: Low lung volumes with vascular crowding and streaky atelectasis. No infiltrates, edema or effusions. Electronically Signed   By: Marijo Sanes M.D.   On: 05/25/2015 20:35     Current Medications:  . amLODipine  10 mg Oral Daily  . aspirin EC  81 mg Oral Daily  . atorvastatin  80 mg Oral q1800  . metoprolol tartrate  50 mg Oral BID  . sodium chloride  3 mL Intravenous Q12H      ASSESSMENT AND PLAN:  1. Chest Pain w/ Moderate Risk for Cardiac Etiology:  - Cardiac enzymes have been negative. D-dimer negative. Without  - Echo shows EF of 65-70% with no wall motion abnormalities. - Day 1 of NST performed 05/26/2015. Second part performed this AM. Results are pending.    2. Hypertensive Urgency:  - BP has been 148/70 - 198/119 in the past 24 hours. - He was not on any meds as an outpatient.  - Metoprolol and amlodipine started and amlodipine increased to 10 mg on 05/26/2015.  - Consider adding HCTZ for better BP control. Creatinine stable at 0.82.   3. Murmur:  - 2/6 SM throughout precordium on exam. - Echo shows no significant stenosis or regurgitation.  4. Dyslipidemia  - Triglycerides 772, Total Cholesterol 212 on 11//2016. - continue statin  Signed, Erma Heritage , PA-C 10:04 AM 05/27/2015 Pager: [phone]  I have seen, examined and evaluated the patient this Am along with Mrs. Ahmed Prima, [place].  After  reviewing all the available data and chart,  I agree with her findings, examination as well as  impression recommendations.  Patient no longer having any chest pain or dyspnea. Still having headaches. Blood pressure still inadequately controlled. I agree with adding diuretic. Would opt for chlorthalidone. Had second part of his Myoview performed this morning. Awaiting results. Echocardiogram was relatively normal. I didn't see anything to explain his murmur. Thankfully his EF is preserved with no significant LVH. This would argue against long-term myocardial damage.  Provided his stress test does not show any evidence of ischemia, his blood pressure looks relatively well-controlled by this afternoon, could consider discharge either this afternoon or in the morning    Cheyeanne Roadcap, Leonie Green, M.D., M.S. Interventional Cardiologist   Pager # [phone]

## 2015-05-28 ENCOUNTER — Telehealth: Payer: Self-pay | Admitting: Cardiovascular Disease

## 2015-05-28 LAB — HEMOGLOBIN A1C
Hgb A1c MFr Bld: 6.4 % — ABNORMAL HIGH (ref 4.8–5.6)
MEAN PLASMA GLUCOSE: 137 mg/dL

## 2015-05-28 NOTE — Telephone Encounter (Signed)
D/C phone call appt is on 06/15/15 @10 :30am w/ Riley Hood at the [facility] office .   Thanks

## 2015-05-28 NOTE — Care Management (Signed)
CM contacted again by pt; stating he instructed MD to send medications to the [facility] on [address] not realizing they would do it. (CM informed pt previously that medications could be filled at clinic pharmacy, pt denied concern or questions).  CM attempted to explain the process of transferring medications from [facility] to clinic, pt unable to grasp concept.  CM contacted clinic pharmacy; pharmacy will request medications to be transferred to clinic pharmacy.  Pharmacy tech Deanna visually verified pt is in the waiting room and will explain to pt that medications will need to be transferred to clinic pharmacy to be filled under the medication assistance program, pt currently in line at clinic pharmacy.  CM contacted pt and communicated this as well, pt stated he understood.   CM was contacted by pt; pt stated he was at the clinic and he was told that he was not in the system.  CM contacted [facility] and verified pts appt with clinic secretary.  CM asked secretary to help pt navigate through process at clinic pharmacy.  CM was contacted by pt; pt stated he could not afford his medications at his local pharmacy, CM reminded pt that he was instructed to get post discharge medications filled at [place] under the medication assistance program.  While CM was on the phone with pt; pt was able to find and read appointment on both the AVS and on the brochure provided to pt by CM prior to discharge.

## 2015-05-29 NOTE — Telephone Encounter (Signed)
Patient contacted regarding discharge from [facility] on 05/27/15.  Patient understands to follow up with provider BRYAN HAGER on 11/28/ at 10:30 at [facility]. Patient understands discharge instructions? yes  Patient understands medications and regiment? yes  Patient understands to bring all medications to this visit? yes

## 2015-06-05 ENCOUNTER — Encounter: Payer: Self-pay | Admitting: Family Medicine

## 2015-06-05 ENCOUNTER — Ambulatory Visit: Payer: Self-pay | Attending: Family Medicine | Admitting: Family Medicine

## 2015-06-05 VITALS — BP 135/81 | HR 90 | Temp 98.0°F | Resp 18 | Ht 72.0 in | Wt 280.0 lb

## 2015-06-05 DIAGNOSIS — R7303 Prediabetes: Secondary | ICD-10-CM

## 2015-06-05 DIAGNOSIS — E669 Obesity, unspecified: Secondary | ICD-10-CM

## 2015-06-05 DIAGNOSIS — R079 Chest pain, unspecified: Secondary | ICD-10-CM

## 2015-06-05 DIAGNOSIS — E785 Hyperlipidemia, unspecified: Secondary | ICD-10-CM | POA: Insufficient documentation

## 2015-06-05 DIAGNOSIS — Z79899 Other long term (current) drug therapy: Secondary | ICD-10-CM | POA: Insufficient documentation

## 2015-06-05 DIAGNOSIS — Z7982 Long term (current) use of aspirin: Secondary | ICD-10-CM | POA: Insufficient documentation

## 2015-06-05 DIAGNOSIS — I10 Essential (primary) hypertension: Secondary | ICD-10-CM

## 2015-06-05 DIAGNOSIS — R011 Cardiac murmur, unspecified: Secondary | ICD-10-CM | POA: Insufficient documentation

## 2015-06-05 DIAGNOSIS — Z09 Encounter for follow-up examination after completed treatment for conditions other than malignant neoplasm: Secondary | ICD-10-CM | POA: Insufficient documentation

## 2015-06-05 LAB — GLUCOSE, POCT (MANUAL RESULT ENTRY): POC Glucose: 195 mg/dl — AB (ref 70–99)

## 2015-06-05 NOTE — Progress Notes (Signed)
Pt's here for hospital f/up for chest pain.   Pt reports no pain. Pt has no further concerns.

## 2015-06-05 NOTE — Patient Instructions (Signed)
Hypertension Hypertension, commonly called high blood pressure, is when the force of blood pumping through your arteries is too strong. Your arteries are the blood vessels that carry blood from your heart throughout your body. A blood pressure reading consists of a higher number over a lower number, such as 110/72. The higher number (systolic) is the pressure inside your arteries when your heart pumps. The lower number (diastolic) is the pressure inside your arteries when your heart relaxes. Ideally you want your blood pressure below 120/80. Hypertension forces your heart to work harder to pump blood. Your arteries may become narrow or stiff. Having untreated or uncontrolled hypertension can cause heart attack, stroke, kidney disease, and other problems. RISK FACTORS Some risk factors for high blood pressure are controllable. Others are not.  Risk factors you cannot control include:   Race. You may be at higher risk if you are African American.  Age. Risk increases with age.  Gender. Men are at higher risk than women before age 45 years. After age 65, women are at higher risk than men. Risk factors you can control include:  Not getting enough exercise or physical activity.  Being overweight.  Getting too much fat, sugar, calories, or salt in your diet.  Drinking too much alcohol. SIGNS AND SYMPTOMS Hypertension does not usually cause signs or symptoms. Extremely high blood pressure (hypertensive crisis) may cause headache, anxiety, shortness of breath, and nosebleed. DIAGNOSIS To check if you have hypertension, your health care provider will measure your blood pressure while you are seated, with your arm held at the level of your heart. It should be measured at least twice using the same arm. Certain conditions can cause a difference in blood pressure between your right and left arms. A blood pressure reading that is higher than normal on one occasion does not mean that you need treatment. If  it is not clear whether you have high blood pressure, you may be asked to return on a different day to have your blood pressure checked again. Or, you may be asked to monitor your blood pressure at home for 1 or more weeks. TREATMENT Treating high blood pressure includes making lifestyle changes and possibly taking medicine. Living a healthy lifestyle can help lower high blood pressure. You may need to change some of your habits. Lifestyle changes may include:  Following the DASH diet. This diet is high in fruits, vegetables, and whole grains. It is low in salt, red meat, and added sugars.  Keep your sodium intake below 2,300 mg per day.  Getting at least 30-45 minutes of aerobic exercise at least 4 times per week.  Losing weight if necessary.  Not smoking.  Limiting alcoholic beverages.  Learning ways to reduce stress. Your health care provider may prescribe medicine if lifestyle changes are not enough to get your blood pressure under control, and if one of the following is true:  You are 18-59 years of age and your systolic blood pressure is above 140.  You are 60 years of age or older, and your systolic blood pressure is above 150.  Your diastolic blood pressure is above 90.  You have diabetes, and your systolic blood pressure is over 140 or your diastolic blood pressure is over 90.  You have kidney disease and your blood pressure is above 140/90.  You have heart disease and your blood pressure is above 140/90. Your personal target blood pressure may vary depending on your medical conditions, your age, and other factors. HOME CARE INSTRUCTIONS    Have your blood pressure rechecked as directed by your health care provider.   Take medicines only as directed by your health care provider. Follow the directions carefully. Blood pressure medicines must be taken as prescribed. The medicine does not work as well when you skip doses. Skipping doses also puts you at risk for  problems.  Do not smoke.   Monitor your blood pressure at home as directed by your health care provider. SEEK MEDICAL CARE IF:   You think you are having a reaction to medicines taken.  You have recurrent headaches or feel dizzy.  You have swelling in your ankles.  You have trouble with your vision. SEEK IMMEDIATE MEDICAL CARE IF:  You develop a severe headache or confusion.  You have unusual weakness, numbness, or feel faint.  You have severe chest or abdominal pain.  You vomit repeatedly.  You have trouble breathing. MAKE SURE YOU:   Understand these instructions.  Will watch your condition.  Will get help right away if you are not doing well or get worse.   This information is not intended to replace advice given to you by your health care provider. Make sure you discuss any questions you have with your health care provider.   Document Released: 07/04/2005 Document Revised: 11/18/2014 Document Reviewed: 04/26/2013 Elsevier Interactive Patient Education 2016 Elsevier Inc.  

## 2015-06-05 NOTE — Progress Notes (Signed)
CC: Follow-up from hospitalization (05/25/15-05/27/15)  HPI: Riley Hood is a 49 y.o. male with a history of newly diagnosed hypertension, newly diagnosed prediabetes recently hospitalized at [facility] for chest pains. He had presented to the ED with exertional chest pain with blood pressure in the accelerated range. Cardiac enzymes were negative, EKG revealed normal sinus rhythm, possible left atrial enlargement, incomplete right bundle branch block, left anterior fascicular block, LVH with repolarization abnormality. He was commenced on IV hydralazine in addition to amlodipine, metoprolol. He had a 2-D echo due to a systolic murmur which revealed an EF of [id number], grade 1 diastolic dysfunction. Nuclear stress test revealed primarily fixed small mild basal inferior lateral perfusion defect, cannot rule out Mark in fact maybe attenuation, no significant ischemia, and no regional wall abnormalities, low risk study.  His chest pain resolved and his blood pressure normalized; he was commenced on high-dose statin due to elevated lipid panel and was discharged with recommendations to follow-up with cardiology outpatient.   Patient has No headache, No chest pain, No abdominal pain - No Nausea, No new weakness tingling or numbness, No Cough - SOB.  No Known Allergies Past Medical History  Diagnosis Date  . Hypertension    Current Outpatient Prescriptions on File Prior to Visit  Medication Sig Dispense Refill  . amLODipine (NORVASC) 10 MG tablet Take 1 tablet (10 mg total) by mouth daily. 30 tablet 6  . aspirin EC 81 MG EC tablet Take 1 tablet (81 mg total) by mouth daily.    Marland Kitchen atorvastatin (LIPITOR) 80 MG tablet Take 1 tablet (80 mg total) by mouth daily at 6 PM. 30 tablet 6  . chlorthalidone (HYGROTON) 25 MG tablet Take 1 tablet (25 mg total) by mouth daily. 30 tablet 6  . metoprolol (LOPRESSOR) 50 MG tablet Take 1 tablet (50 mg total) by mouth 2 (two) times daily. 60 tablet 6  .  azithromycin (ZITHROMAX Z-PAK) 250 MG tablet Take as directed. (Patient not taking: Reported on 06/05/2015) 6 tablet 0  . benzonatate (TESSALON) 200 MG capsule Take 1 capsule (200 mg total) by mouth 3 (three) times daily as needed for cough. (Patient not taking: Reported on 06/05/2015) 30 capsule 0   No current facility-administered medications on file prior to visit.   No family history on file. Social History   Social History  . Marital Status: Single    Spouse Name: N/A  . Number of Children: N/A  . Years of Education: N/A   Occupational History  . Not on file.   Social History Main Topics  . Smoking status: Never Smoker   . Smokeless tobacco: Not on file  . Alcohol Use: Yes     Comment: occasional beer  . Drug Use: No  . Sexual Activity: Not on file   Other Topics Concern  . Not on file   Social History Narrative    Review of Systems: Constitutional: Negative for fever, chills, diaphoresis, activity change, appetite change and fatigue. HENT: Negative for ear pain, nosebleeds, congestion, facial swelling, rhinorrhea, neck pain, neck stiffness and ear discharge.  Eyes: Negative for pain, discharge, redness, itching and visual disturbance. Respiratory: Negative for cough, choking, chest tightness, shortness of breath, wheezing and stridor.  Cardiovascular: Negative for chest pain, palpitations and leg swelling. Gastrointestinal: Negative for abdominal distention. Genitourinary: Negative for dysuria, urgency, frequency, hematuria, flank pain, decreased urine volume, difficulty urinating and dyspareunia.  Musculoskeletal: Negative for back pain, joint swelling, arthralgias and gait problem. Neurological: Negative for dizziness,  tremors, seizures, syncope, facial asymmetry, speech difficulty, weakness, light-headedness, numbness and headaches.  Hematological: Negative for adenopathy. Does not bruise/bleed easily. Psychiatric/Behavioral: Negative for hallucinations, behavioral  problems, confusion, dysphoric mood, decreased concentration and agitation.    Objective:   Filed Vitals:   06/05/15 1402  BP: 135/81  Pulse: 90  Temp: 98 F (36.7 C)  Resp: 18    Physical Exam: Constitutional: Patient appears well-developed and well-nourished. No distress. HENT: Normocephalic, atraumatic, External right and left ear normal. Oropharynx is clear and moist.  Eyes: Conjunctivae and EOM are normal. PERRLA, no scleral icterus. Neck: Normal ROM. Neck supple. No JVD. No tracheal deviation. No thyromegaly. CVS: RRR, [id number] +, 2/6 systolic murmur, no gallops, no carotid bruit.  Pulmonary: Effort and breath sounds normal, no stridor, rhonchi, wheezes, rales.  Abdominal: Soft. BS +,  no distension, tenderness, rebound or guarding.  Musculoskeletal: Normal range of motion. No edema and no tenderness.  Lymphadenopathy: No lymphadenopathy noted, cervical, inguinal or axillary Neuro: Alert. Normal reflexes, muscle tone coordination. No cranial nerve deficit. Skin: Skin is warm and dry. No rash noted. Not diaphoretic. No erythema. No pallor. Psychiatric: Normal mood and affect. Behavior, judgment, thought content normal.  Lab Results  Component Value Date   WBC 5.4 05/26/2015   HGB 14.0 05/26/2015   HCT 40.1 05/26/2015   MCV 72.8* 05/26/2015   PLT 242 05/26/2015   Lab Results  Component Value Date   CREATININE 0.82 05/26/2015   BUN 11 05/26/2015   NA 136 05/26/2015   K 3.8 05/26/2015   CL 99* 05/26/2015   CO2 28 05/26/2015    Lab Results  Component Value Date   HGBA1C 6.4* 05/27/2015   Lipid Panel     Component Value Date/Time   CHOL 212* 05/26/2015 0416   TRIG 772* 05/26/2015 0416   HDL 43 05/26/2015 0416   CHOLHDL 4.9 05/26/2015 0416   VLDL UNABLE TO CALCULATE IF TRIGLYCERIDE OVER 400 mg/dL 05/26/2015 0416   LDLCALC UNABLE TO CALCULATE IF TRIGLYCERIDE OVER 400 mg/dL 05/26/2015 0416       Assessment and plan:  Hypertension: Controlled. Continue  antihypertensives. Low-sodium,DASHdiet  Chest pain: None at this time. Aggressive risk factor modification. Scheduled to see cardiology on 06/15/15  Prediabetes: A1c of 6.3. Advised on ADA diet, my plate method, exercise regimen to avoid developing diabetes.  Hyperlipidemia: Currently on a statin. Advised on low-cholesterol foods.  Obesity: He has lost 7 pounds since discharge from the hospital and has been commended. Advised to cut portion sizes, increase physical activity.  This note has been created with Surveyor, quantity. Any transcriptional errors are unintentional.        Arnoldo Morale, MD. [facility] [phone] 06/05/2015, 2:16 PM

## 2015-06-06 LAB — MICROALBUMIN / CREATININE URINE RATIO
CREATININE, URINE: 314 mg/dL (ref 20–370)
Microalb Creat Ratio: 8 mcg/mg creat (ref <30)
Microalb, Ur: 2.4 mg/dL

## 2015-06-08 ENCOUNTER — Telehealth: Payer: Self-pay

## 2015-06-08 NOTE — Telephone Encounter (Signed)
-----   Message from Arnoldo Morale, MD sent at 06/08/2015  8:31 AM EST ----- Please inform the patient that labs are normal. Thank you.

## 2015-06-08 NOTE — Telephone Encounter (Signed)
CMA called patient, patient did not answer. Left message for the patient to return my call asap.

## 2015-06-15 ENCOUNTER — Ambulatory Visit: Payer: Self-pay | Admitting: Physician Assistant

## 2015-06-15 DIAGNOSIS — R0989 Other specified symptoms and signs involving the circulatory and respiratory systems: Secondary | ICD-10-CM

## 2015-06-15 NOTE — Telephone Encounter (Signed)
CMA called patient, patient verified name and DOB. Pt was given lab results with no further questions.

## 2015-06-29 ENCOUNTER — Ambulatory Visit: Payer: Self-pay | Admitting: Physician Assistant

## 2015-06-29 DIAGNOSIS — R0989 Other specified symptoms and signs involving the circulatory and respiratory systems: Secondary | ICD-10-CM

## 2015-07-03 ENCOUNTER — Ambulatory Visit: Payer: Self-pay | Admitting: Physician Assistant

## 2015-07-03 DIAGNOSIS — R0989 Other specified symptoms and signs involving the circulatory and respiratory systems: Secondary | ICD-10-CM

## 2015-07-06 ENCOUNTER — Ambulatory Visit: Payer: Self-pay | Admitting: Family Medicine

## 2015-07-08 ENCOUNTER — Encounter: Payer: Self-pay | Admitting: *Deleted

## 2016-08-10 ENCOUNTER — Ambulatory Visit (HOSPITAL_COMMUNITY)
Admission: EM | Admit: 2016-08-10 | Discharge: 2016-08-10 | Disposition: A | Discharge status: Home / Self Care | Payer: Self-pay | Attending: Emergency Medicine | Admitting: Emergency Medicine

## 2016-08-10 ENCOUNTER — Encounter (HOSPITAL_COMMUNITY): Payer: Self-pay | Admitting: Family Medicine

## 2016-08-10 DIAGNOSIS — I10 Essential (primary) hypertension: Secondary | ICD-10-CM

## 2016-08-10 DIAGNOSIS — K047 Periapical abscess without sinus: Secondary | ICD-10-CM

## 2016-08-10 LAB — POCT I-STAT, CHEM 8
BUN: 19 mg/dL (ref 6–20)
CHLORIDE: 101 mmol/L (ref 101–111)
CREATININE: 1.1 mg/dL (ref 0.61–1.24)
Calcium, Ion: 1.2 mmol/L (ref 1.15–1.40)
Glucose, Bld: 121 mg/dL — ABNORMAL HIGH (ref 65–99)
HCT: 49 % (ref 39.0–52.0)
Hemoglobin: 16.7 g/dL (ref 13.0–17.0)
POTASSIUM: 4.1 mmol/L (ref 3.5–5.1)
Sodium: 136 mmol/L (ref 135–145)
TCO2: 28 mmol/L (ref 0–100)

## 2016-08-10 MED ORDER — METOPROLOL TARTRATE 50 MG PO TABS: 50.0000 mg | ORAL_TABLET | Freq: Two times a day (BID) | ORAL | 2 refills | Status: DC

## 2016-08-10 MED ORDER — CHLORTHALIDONE 25 MG PO TABS: 25.0000 mg | ORAL_TABLET | Freq: Every day | ORAL | 2 refills | Status: DC

## 2016-08-10 MED ORDER — ONDANSETRON HCL 4 MG PO TABS: 4.0000 mg | ORAL_TABLET | Freq: Three times a day (TID) | ORAL | 0 refills | Status: DC | PRN

## 2016-08-10 MED ORDER — ONDANSETRON 4 MG PO TBDP
ORAL_TABLET | ORAL | Status: AC
  Filled 2016-08-10: qty 1

## 2016-08-10 MED ORDER — HYDROCODONE-ACETAMINOPHEN 5-325 MG PO TABS: 1.0000 | ORAL_TABLET | Freq: Four times a day (QID) | ORAL | 0 refills | Status: DC | PRN

## 2016-08-10 MED ORDER — HYDROCODONE-ACETAMINOPHEN 5-325 MG PO TABS
1.0000 | ORAL_TABLET | Freq: Once | ORAL | Status: AC
  Administered 2016-08-10: 1 via ORAL

## 2016-08-10 MED ORDER — HYDROCODONE-ACETAMINOPHEN 5-325 MG PO TABS
ORAL_TABLET | ORAL | Status: AC
  Filled 2016-08-10: qty 1

## 2016-08-10 MED ORDER — AMLODIPINE BESYLATE 10 MG PO TABS: 10.0000 mg | ORAL_TABLET | Freq: Every day | ORAL | 2 refills | Status: DC

## 2016-08-10 MED ORDER — ONDANSETRON 4 MG PO TBDP
4.0000 mg | ORAL_TABLET | Freq: Once | ORAL | Status: AC
  Administered 2016-08-10: 4 mg via ORAL

## 2016-08-10 MED ORDER — AMOXICILLIN 875 MG PO TABS: 875.0000 mg | ORAL_TABLET | Freq: Two times a day (BID) | ORAL | 0 refills | Status: DC

## 2016-08-10 MED FILL — AMLODIPINE BESYLATE 10 MG T: 10 | 30 days supply | Qty: 30 | Fill #0

## 2016-08-10 MED FILL — ?CHLORTHALIDONE 25 MG TABLE: 25 | 30 days supply | Qty: 30 | Fill #0

## 2016-08-10 MED FILL — ONDANSETRON HCL 4 MG TABLET: 4 | 6 days supply | Qty: 20 | Fill #0

## 2016-08-10 MED FILL — ?AMOXICILLIN 500 MG CAPSULE: 500 | 10 days supply | Qty: 40 | Fill #0

## 2016-08-10 MED FILL — METOPROLOL TARTRATE 50 MG T: 50 | 30 days supply | Qty: 60 | Fill #0

## 2016-08-10 NOTE — Discharge Instructions (Signed)
You have a dental abscess. Take amoxicillin twice a day for 10 days. It is okay to take Tylenol or aspirin as needed for pain. Stay away from ibuprofen. Use the hydrocodone every 4-6 hours as needed for severe pain. Do not drive while taking this medicine.  I also provided a 3 month supply of your blood pressure medication.  Please find a primary care doctor once you receive your Medicaid card.

## 2016-08-10 NOTE — ED Notes (Signed)
Reports vomiting pain pill, notified dr Bridgett Larsson

## 2016-08-10 NOTE — ED Provider Notes (Addendum)
[place]    CSN: [id number] Arrival date & time: 08/10/16  1033     History   Chief Complaint Chief Complaint  Patient presents with  . Dental Pain    HPI Riley Hood is a 51 y.o. male.   HPI  He is a 51 year old man here for evaluation of dental pain. This is been going on for 7-10 days. Pain is primarily in the right upper molar. He does have some pain in the left upper molar as well. He has been taking aspirin with some improvement of the pain. He has developed some drainage which is making him nauseous. Denies any fevers.  His blood pressure is quite elevated. He has been out of his blood pressure medications for 6 months.  Past Medical History:  Diagnosis Date  . Hypertension     Patient Active Problem List   Diagnosis Date Noted  . Prediabetes 06/05/2015  . Obesity 06/05/2015  . Murmur, cardiac 06/05/2015  . Chest pain with moderate risk for cardiac etiology 05/26/2015  . Hypertension 05/25/2015  . Hypertensive urgency 05/25/2015    History reviewed. No pertinent surgical history.     Home Medications    Prior to Admission medications   Medication Sig Start Date End Date Taking? Authorizing Provider  amLODipine (NORVASC) 10 MG tablet Take 1 tablet (10 mg total) by mouth daily. 08/10/16   Melony Overly, MD  amoxicillin (AMOXIL) 875 MG tablet Take 1 tablet (875 mg total) by mouth 2 (two) times daily. 08/10/16   Melony Overly, MD  aspirin EC 81 MG EC tablet Take 1 tablet (81 mg total) by mouth daily. 05/27/15   Erma Heritage, PA  atorvastatin (LIPITOR) 80 MG tablet Take 1 tablet (80 mg total) by mouth daily at 6 PM. 05/27/15   Erma Heritage, PA  chlorthalidone (HYGROTON) 25 MG tablet Take 1 tablet (25 mg total) by mouth daily. 08/10/16   Melony Overly, MD  HYDROcodone-acetaminophen (NORCO) 5-325 MG tablet Take 1 tablet by mouth every 6 (six) hours as needed for moderate pain. 08/10/16   Melony Overly, MD  metoprolol (LOPRESSOR) 50 MG tablet  Take 1 tablet (50 mg total) by mouth 2 (two) times daily. 08/10/16   Melony Overly, MD  ondansetron (ZOFRAN) 4 MG tablet Take 1 tablet (4 mg total) by mouth every 8 (eight) hours as needed for nausea or vomiting. 08/10/16   Melony Overly, MD  vitamin B-12 (CYANOCOBALAMIN) 250 MCG tablet Take 250 mcg by mouth daily.    Historical Provider, MD    Family History History reviewed. No pertinent family history.  Social History Social History  Substance Use Topics  . Smoking status: Never Smoker  . Smokeless tobacco: Never Used  . Alcohol use Yes     Comment: occasional beer     Allergies   Patient has no known allergies.   Review of Systems Review of Systems As in history of present illness  Physical Exam Triage Vital Signs ED Triage Vitals  Enc Vitals Group     BP 08/10/16 1101 (!) 192/128     Pulse Rate 08/10/16 1101 82     Resp 08/10/16 1101 16     Temp --      Temp src --      SpO2 08/10/16 1101 100 %     Weight --      Height --      Head Circumference --  Peak Flow --      Pain Score 08/10/16 1100 3     Pain Loc --      Pain Edu? --      Excl. in [place]? --    No data found.   Updated Vital Signs BP (!) 192/128   Pulse 82   Resp 16   SpO2 100%   Visual Acuity Right Eye Distance:   Left Eye Distance:   Bilateral Distance:    Right Eye Near:   Left Eye Near:    Bilateral Near:     Physical Exam  Constitutional: He is oriented to person, place, and time. He appears well-developed and well-nourished. No distress.  HENT:  Mouth/Throat: Abnormal dentition.    Cardiovascular: Normal rate.   Pulmonary/Chest: Effort normal.  Neurological: He is alert and oriented to person, place, and time.     UC Treatments / Results  Labs (all labs ordered are listed, but only abnormal results are displayed) Labs Reviewed  POCT I-STAT, CHEM 8 - Abnormal; Notable for the following:       Result Value   Glucose, Bld 121 (*)    All other components within normal  limits    EKG  EKG Interpretation None       Radiology No results found.  Procedures Procedures (including critical care time)  Medications Ordered in UC Medications  HYDROcodone-acetaminophen (NORCO/VICODIN) 5-325 MG per tablet 1 tablet (1 tablet Oral Given 08/10/16 1145)  ondansetron (ZOFRAN-ODT) disintegrating tablet 4 mg (4 mg Oral Given 08/10/16 1208)     Initial Impression / Assessment and Plan / UC Course  I have reviewed the triage vital signs and the nursing notes.  Pertinent labs & imaging results that were available during my care of the patient were reviewed by me and considered in my medical decision making (see chart for details).    Patient vomited hydrocodone. We'll give Zofran here as well as a prescription.  We'll treat dental abscess with amoxicillin and hydrocodone as needed for pain. Given blood pressure, will avoid NSAIDs. I did provide him 3 month supply of his amlodipine, chlorthalidone, and metoprolol. He is in the process of getting Medicaid. Follow-up with PCP as soon as possible.  Final Clinical Impressions(s) / UC Diagnoses   Final diagnoses:  Dental abscess  Essential hypertension    New Prescriptions New Prescriptions   AMOXICILLIN (AMOXIL) 875 MG TABLET    Take 1 tablet (875 mg total) by mouth 2 (two) times daily.   HYDROCODONE-ACETAMINOPHEN (NORCO) 5-325 MG TABLET    Take 1 tablet by mouth every 6 (six) hours as needed for moderate pain.   ONDANSETRON (ZOFRAN) 4 MG TABLET    Take 1 tablet (4 mg total) by mouth every 8 (eight) hours as needed for nausea or vomiting.     Melony Overly, MD 08/10/16 [place], MD 08/10/16 1212

## 2016-08-10 NOTE — ED Triage Notes (Signed)
Pt here for dental pain and infection in tooth x 1 week.

## 2016-09-19 MED FILL — ?CHLORTHALIDONE 25 MG TABLE: 25 | 30 days supply | Qty: 30 | Fill #1

## 2016-09-19 MED FILL — AMLODIPINE BESYLATE 10 MG T: 10 | 30 days supply | Qty: 30 | Fill #1

## 2016-11-01 MED FILL — CHLORTHALIDONE 25 MG TABLET: 25 | 30 days supply | Qty: 30 | Fill #2

## 2016-11-01 MED FILL — AMLODIPINE BESYLATE 10 MG T: 10 | 30 days supply | Qty: 30 | Fill #2

## 2019-02-26 ENCOUNTER — Ambulatory Visit: Payer: Self-pay | Attending: Nurse Practitioner | Admitting: Nurse Practitioner

## 2019-08-17 ENCOUNTER — Inpatient Hospital Stay (HOSPITAL_COMMUNITY)
Admission: EM | Admit: 2019-08-17 | Discharge: 2019-08-20 | DRG: 637 | Disposition: A | Discharge status: Home / Self Care | Payer: Self-pay | Attending: Internal Medicine | Admitting: Internal Medicine

## 2019-08-17 ENCOUNTER — Encounter (HOSPITAL_COMMUNITY): Payer: Self-pay | Admitting: Emergency Medicine

## 2019-08-17 ENCOUNTER — Other Ambulatory Visit: Payer: Self-pay

## 2019-08-17 DIAGNOSIS — E875 Hyperkalemia: Secondary | ICD-10-CM | POA: Diagnosis present

## 2019-08-17 DIAGNOSIS — R000 Tachycardia, unspecified: Secondary | ICD-10-CM | POA: Diagnosis present

## 2019-08-17 DIAGNOSIS — Z79899 Other long term (current) drug therapy: Secondary | ICD-10-CM

## 2019-08-17 DIAGNOSIS — U071 COVID-19: Secondary | ICD-10-CM | POA: Diagnosis present

## 2019-08-17 DIAGNOSIS — E1169 Type 2 diabetes mellitus with other specified complication: Secondary | ICD-10-CM | POA: Diagnosis present

## 2019-08-17 DIAGNOSIS — E1100 Type 2 diabetes mellitus with hyperosmolarity without nonketotic hyperglycemic-hyperosmolar coma (NKHHC): Principal | ICD-10-CM | POA: Diagnosis present

## 2019-08-17 DIAGNOSIS — Z7982 Long term (current) use of aspirin: Secondary | ICD-10-CM

## 2019-08-17 DIAGNOSIS — E860 Dehydration: Secondary | ICD-10-CM | POA: Diagnosis present

## 2019-08-17 DIAGNOSIS — E1310 Other specified diabetes mellitus with ketoacidosis without coma: Secondary | ICD-10-CM

## 2019-08-17 DIAGNOSIS — E1110 Type 2 diabetes mellitus with ketoacidosis without coma: Secondary | ICD-10-CM | POA: Diagnosis present

## 2019-08-17 DIAGNOSIS — I10 Essential (primary) hypertension: Secondary | ICD-10-CM | POA: Diagnosis present

## 2019-08-17 DIAGNOSIS — N179 Acute kidney failure, unspecified: Secondary | ICD-10-CM | POA: Diagnosis present

## 2019-08-17 DIAGNOSIS — R631 Polydipsia: Secondary | ICD-10-CM | POA: Diagnosis present

## 2019-08-17 DIAGNOSIS — E1165 Type 2 diabetes mellitus with hyperglycemia: Secondary | ICD-10-CM | POA: Diagnosis present

## 2019-08-17 DIAGNOSIS — R531 Weakness: Secondary | ICD-10-CM | POA: Diagnosis present

## 2019-08-17 DIAGNOSIS — Z79891 Long term (current) use of opiate analgesic: Secondary | ICD-10-CM

## 2019-08-17 DIAGNOSIS — Z6839 Body mass index (BMI) 39.0-39.9, adult: Secondary | ICD-10-CM

## 2019-08-17 DIAGNOSIS — E118 Type 2 diabetes mellitus with unspecified complications: Secondary | ICD-10-CM

## 2019-08-17 LAB — BASIC METABOLIC PANEL
Anion gap: 18 — ABNORMAL HIGH (ref 5–15)
Anion gap: 18 — ABNORMAL HIGH (ref 5–15)
Anion gap: 12 (ref 5–15)
Anion gap: 11 (ref 5–15)
BUN: 25 mg/dL — ABNORMAL HIGH (ref 6–20)
BUN: 28 mg/dL — ABNORMAL HIGH (ref 6–20)
BUN: 24 mg/dL — ABNORMAL HIGH (ref 6–20)
BUN: 22 mg/dL — ABNORMAL HIGH (ref 6–20)
CO2: 22 mmol/L (ref 22–32)
CO2: 23 mmol/L (ref 22–32)
CO2: 28 mmol/L (ref 22–32)
CO2: 27 mmol/L (ref 22–32)
Calcium: 11.8 mg/dL — ABNORMAL HIGH (ref 8.9–10.3)
Calcium: 11.4 mg/dL — ABNORMAL HIGH (ref 8.9–10.3)
Calcium: 11.1 mg/dL — ABNORMAL HIGH (ref 8.9–10.3)
Calcium: 10.8 mg/dL — ABNORMAL HIGH (ref 8.9–10.3)
Chloride: 82 mmol/L — ABNORMAL LOW (ref 98–111)
Chloride: 88 mmol/L — ABNORMAL LOW (ref 98–111)
Chloride: 97 mmol/L — ABNORMAL LOW (ref 98–111)
Chloride: 99 mmol/L (ref 98–111)
Creatinine, Ser: 1.51 mg/dL — ABNORMAL HIGH (ref 0.61–1.24)
Creatinine, Ser: 1.6 mg/dL — ABNORMAL HIGH (ref 0.61–1.24)
Creatinine, Ser: 1.35 mg/dL — ABNORMAL HIGH (ref 0.61–1.24)
Creatinine, Ser: 1.15 mg/dL (ref 0.61–1.24)
GFR calc Af Amer: >60 mL/min (ref >60)
GFR calc Af Amer: 56 mL/min — ABNORMAL LOW (ref >60)
GFR calc Af Amer: >60 mL/min (ref >60)
GFR calc Af Amer: >60 mL/min (ref >60)
GFR calc non Af Amer: 52 mL/min — ABNORMAL LOW (ref >60)
GFR calc non Af Amer: 48 mL/min — ABNORMAL LOW (ref >60)
GFR calc non Af Amer: 60 mL/min — ABNORMAL LOW (ref >60)
GFR calc non Af Amer: >60 mL/min (ref >60)
Glucose, Bld: 823 mg/dL (ref 70–99)
Glucose, Bld: 648 mg/dL (ref 70–99)
Glucose, Bld: 295 mg/dL — ABNORMAL HIGH (ref 70–99)
Glucose, Bld: 223 mg/dL — ABNORMAL HIGH (ref 70–99)
Potassium: 6 mmol/L — ABNORMAL HIGH (ref 3.5–5.1)
Potassium: 5.1 mmol/L (ref 3.5–5.1)
Potassium: 4.2 mmol/L (ref 3.5–5.1)
Potassium: 4.2 mmol/L (ref 3.5–5.1)
Sodium: 122 mmol/L — ABNORMAL LOW (ref 135–145)
Sodium: 129 mmol/L — ABNORMAL LOW (ref 135–145)
Sodium: 137 mmol/L (ref 135–145)
Sodium: 137 mmol/L (ref 135–145)

## 2019-08-17 LAB — URINALYSIS, ROUTINE W REFLEX MICROSCOPIC
Bacteria, UA: NONE SEEN
Bilirubin Urine: NEGATIVE
Glucose, UA: >=500 mg/dL — AB
Hgb urine dipstick: NEGATIVE
Ketones, ur: 20 mg/dL — AB
Leukocytes,Ua: NEGATIVE
Nitrite: NEGATIVE
Protein, ur: NEGATIVE mg/dL
Specific Gravity, Urine: 1.027 (ref 1.005–1.030)
pH: 6 (ref 5.0–8.0)

## 2019-08-17 LAB — CBC WITH DIFFERENTIAL/PLATELET
Abs Immature Granulocytes: 0.09 10*3/uL — ABNORMAL HIGH (ref 0.00–0.07)
Basophils Absolute: 0.1 10*3/uL (ref 0.0–0.1)
Basophils Relative: 1 %
Eosinophils Absolute: 0 10*3/uL (ref 0.0–0.5)
Eosinophils Relative: 1 %
HCT: 50.7 % (ref 39.0–52.0)
Hemoglobin: 17.1 g/dL — ABNORMAL HIGH (ref 13.0–17.0)
Immature Granulocytes: 1 %
Lymphocytes Relative: 24 %
Lymphs Abs: 2 10*3/uL (ref 0.7–4.0)
MCH: 25.3 pg — ABNORMAL LOW (ref 26.0–34.0)
MCHC: 33.7 g/dL (ref 30.0–36.0)
MCV: 75 fL — ABNORMAL LOW (ref 80.0–100.0)
Monocytes Absolute: 0.7 10*3/uL (ref 0.1–1.0)
Monocytes Relative: 8 %
Neutro Abs: 5.5 10*3/uL (ref 1.7–7.7)
Neutrophils Relative %: 65 %
Platelets: 335 10*3/uL (ref 150–400)
RBC: 6.76 MIL/uL — ABNORMAL HIGH (ref 4.22–5.81)
RDW: 16.5 % — ABNORMAL HIGH (ref 11.5–15.5)
WBC: 8.4 10*3/uL (ref 4.0–10.5)
nRBC: 0 % (ref 0.0–0.2)

## 2019-08-17 LAB — GLUCOSE, CAPILLARY
Glucose-Capillary: 294 mg/dL — ABNORMAL HIGH (ref 70–99)
Glucose-Capillary: 258 mg/dL — ABNORMAL HIGH (ref 70–99)
Glucose-Capillary: 213 mg/dL — ABNORMAL HIGH (ref 70–99)
Glucose-Capillary: 222 mg/dL — ABNORMAL HIGH (ref 70–99)
Glucose-Capillary: 193 mg/dL — ABNORMAL HIGH (ref 70–99)
Glucose-Capillary: 240 mg/dL — ABNORMAL HIGH (ref 70–99)

## 2019-08-17 LAB — CBG MONITORING, ED
Glucose-Capillary: >600 mg/dL (ref 70–99)
Glucose-Capillary: >600 mg/dL (ref 70–99)
Glucose-Capillary: >600 mg/dL (ref 70–99)
Glucose-Capillary: 534 mg/dL (ref 70–99)
Glucose-Capillary: 456 mg/dL — ABNORMAL HIGH (ref 70–99)
Glucose-Capillary: 466 mg/dL — ABNORMAL HIGH (ref 70–99)
Glucose-Capillary: 354 mg/dL — ABNORMAL HIGH (ref 70–99)

## 2019-08-17 LAB — SARS CORONAVIRUS 2 (TAT 6-24 HRS): SARS Coronavirus 2: POSITIVE — AB

## 2019-08-17 LAB — LIPID PANEL
Cholesterol: 356 mg/dL — ABNORMAL HIGH (ref 0–200)
HDL: 37 mg/dL — ABNORMAL LOW (ref >40)
LDL Cholesterol: UNDETERMINED mg/dL (ref 0–99)
Total CHOL/HDL Ratio: 9.6 RATIO
Triglycerides: 620 mg/dL — ABNORMAL HIGH (ref <150)
VLDL: UNDETERMINED mg/dL (ref 0–40)

## 2019-08-17 LAB — OSMOLALITY: Osmolality: 323 mOsm/kg (ref 275–295)

## 2019-08-17 LAB — HEMOGLOBIN A1C
Hgb A1c MFr Bld: 11.8 % — ABNORMAL HIGH (ref 4.8–5.6)
Mean Plasma Glucose: 291.96 mg/dL

## 2019-08-17 LAB — LDL CHOLESTEROL, DIRECT: Direct LDL: 162.9 mg/dL — ABNORMAL HIGH (ref 0–99)

## 2019-08-17 LAB — HIV ANTIBODY (ROUTINE TESTING W REFLEX): HIV Screen 4th Generation wRfx: NONREACTIVE

## 2019-08-17 MED ORDER — INSULIN ASPART 100 UNIT/ML ~~LOC~~ SOLN
15.0000 [IU] | Freq: Once | SUBCUTANEOUS | Status: AC
  Administered 2019-08-17: 15 [IU] via SUBCUTANEOUS

## 2019-08-17 MED ORDER — SODIUM CHLORIDE 0.9 % IV BOLUS
1000.0000 mL | Freq: Once | INTRAVENOUS | Status: AC
  Administered 2019-08-17: 1000 mL via INTRAVENOUS

## 2019-08-17 MED ORDER — INSULIN ASPART 100 UNIT/ML ~~LOC~~ SOLN
0.0000 [IU] | Freq: Every day | SUBCUTANEOUS | Status: DC
  Administered 2019-08-18 – 2019-08-19 (×2): 2 [IU] via SUBCUTANEOUS

## 2019-08-17 MED ORDER — HEPARIN SODIUM (PORCINE) 5000 UNIT/ML IJ SOLN
5000.0000 [IU] | Freq: Three times a day (TID) | INTRAMUSCULAR | Status: DC
  Administered 2019-08-17 – 2019-08-20 (×9): 5000 [IU] via SUBCUTANEOUS
  Filled 2019-08-17 (×9): qty 1

## 2019-08-17 MED ORDER — HYDRALAZINE HCL 25 MG PO TABS: 25.0000 mg | ORAL_TABLET | Freq: Four times a day (QID) | ORAL | Status: DC | PRN

## 2019-08-17 MED ORDER — SODIUM CHLORIDE 0.9 % IV SOLN: INTRAVENOUS | Status: DC

## 2019-08-17 MED ORDER — INSULIN GLARGINE 100 UNIT/ML ~~LOC~~ SOLN
10.0000 [IU] | Freq: Once | SUBCUTANEOUS | Status: AC
  Administered 2019-08-17: 10 [IU] via SUBCUTANEOUS
  Filled 2019-08-17: qty 0.1

## 2019-08-17 MED ORDER — INSULIN STARTER KIT- SYRINGES (ENGLISH)
1.0000 | Freq: Once | Status: AC
  Administered 2019-08-17: 1
  Filled 2019-08-17: qty 1

## 2019-08-17 MED ORDER — INSULIN ASPART 100 UNIT/ML IV SOLN
10.0000 [IU] | Freq: Once | INTRAVENOUS | Status: AC
  Administered 2019-08-17: 10 [IU] via INTRAVENOUS

## 2019-08-17 MED ORDER — DEXTROSE 50 % IV SOLN: 0.0000 mL | INTRAVENOUS | Status: DC | PRN

## 2019-08-17 MED ORDER — LIVING WELL WITH DIABETES BOOK
Freq: Once | Status: AC
  Filled 2019-08-17: qty 1

## 2019-08-17 MED ORDER — INSULIN REGULAR(HUMAN) IN NACL 100-0.9 UT/100ML-% IV SOLN
INTRAVENOUS | Status: DC
  Administered 2019-08-17: 15 [IU]/h via INTRAVENOUS
  Filled 2019-08-17: qty 100

## 2019-08-17 MED ORDER — DEXTROSE-NACL 5-0.45 % IV SOLN: INTRAVENOUS | Status: DC

## 2019-08-17 MED ORDER — INSULIN ASPART 100 UNIT/ML ~~LOC~~ SOLN
0.0000 [IU] | Freq: Three times a day (TID) | SUBCUTANEOUS | Status: DC
  Administered 2019-08-18 (×2): 15 [IU] via SUBCUTANEOUS

## 2019-08-17 NOTE — Progress Notes (Signed)
Inpatient Diabetes Program Recommendations  AACE/ADA: New Consensus Statement on Inpatient Glycemic Control   Target Ranges:  Prepandial:   less than 140 mg/dL      Peak postprandial:   less than 180 mg/dL (1-2 hours)      Critically ill patients:  140 - 180 mg/dL   Results for LEEANDRE, NORDLING (MRN [id number]) as of 08/17/2019 13:36  Ref. Range 08/17/2019 09:36 08/17/2019 12:15 08/17/2019 13:21  Glucose-Capillary Latest Ref Range: 70 - 99 mg/dL >600 (HH) >600 (HH) >600 [facility])  Results for SANTANA, EDELL (MRN [id number]) as of 08/17/2019 13:36  Ref. Range 08/17/2019 09:52  Glucose Latest Ref Range: 70 - 99 mg/dL [facility])  Results for RAKAN, SOFFER (MRN [id number]) as of 08/17/2019 13:36  Ref. Range 05/27/2015 04:16  Hemoglobin A1C Latest Ref Range: 4.8 - 5.6 % 6.4 (H)   Review of Glycemic Control  Diabetes history: PreDM Outpatient Diabetes medications: None Current orders for Inpatient glycemic control: IV insulin  NOTE: Noted consult for Diabetes Coordinator. Diabetes Coordinator is not on campus over the weekend but available by pager from 8am to 5pm for questions or concerns. Chart reviewed. Patient has no insurance or PCP listed and has TOC consult to assist with follow up and medications. In ED with initial glucose of 823 mg/dl and has just been started on IV insulin drip. Ordered: Living Well with DM book, insulin starter kit, and patient education by RNs (RNs to educate on DM, insulin, and allow patient to self inject insulin).  Diabetes Coordinator will follow while inpatient and will plan to try to call patient tomorrow.  Thanks, Barnie Alderman, RN, MSN, CDE Diabetes Coordinator Inpatient Diabetes Program [phone] (Team Pager from 8am to 5pm)

## 2019-08-17 NOTE — ED Notes (Addendum)
Date and time results received: 08/17/19   Test: glucose Critical Value: 823 mg/dl  Name of Provider Notified: Claiborne Billings PA

## 2019-08-17 NOTE — ED Triage Notes (Signed)
C/o urinary incontinence x 3 days.  Taking AZO without improvement.  Denies pain.

## 2019-08-17 NOTE — ED Notes (Signed)
Pt's daughter was called (per pt request) to be given a update about pt, no answer, return number was left on voicemail.

## 2019-08-17 NOTE — ED Provider Notes (Addendum)
[facility] EMERGENCY DEPARTMENT Provider Note   CSN: [id number] Arrival date & time: 08/17/19  0846     History Chief Complaint  Patient presents with  . Urinary Incontinence    Riley Hood is a 54 y.o. male with history of HTN who presents with urinary frequency and incontinence. The patient states that he's had problems with urinary frequency for about 6 months. He reports urgency symptoms when he has to urinate and urinates often. He denies problems initiating voiding or straining. He feels like he completely empties his bladder however over the past couple days he will urinate and then sit down and will have post-void dribbling. Sometimes when he finishes urinating he will turn on the faucet and will urinate a little more. He recently bought some OTC AZO because he saw an ad on YouTube that this would help his symptoms. He's been taking this for 3 days but symptoms have seem to become worse. He is not taking any prescription medications. He previously went to [facility] but hasn't seen them in years. He denies flank pain, penis or testicular pain, dysuria or hematuria.    HPI     Past Medical History:  Diagnosis Date  . Hypertension     Patient Active Problem List   Diagnosis Date Noted  . Prediabetes 06/05/2015  . Obesity 06/05/2015  . Murmur, cardiac 06/05/2015  . Chest pain with moderate risk for cardiac etiology 05/26/2015  . Hypertension 05/25/2015  . Hypertensive urgency 05/25/2015    History reviewed. No pertinent surgical history.     No family history on file.  Social History   Tobacco Use  . Smoking status: Never Smoker  . Smokeless tobacco: Never Used  Substance Use Topics  . Alcohol use: Yes    Comment: occasional beer  . Drug use: No    Home Medications Prior to Admission medications   Medication Sig Start Date End Date Taking? Authorizing Provider  amLODipine (NORVASC) 10 MG tablet Take 1 tablet (10 mg  total) by mouth daily. 08/10/16   Melony Overly, MD  amoxicillin (AMOXIL) 875 MG tablet Take 1 tablet (875 mg total) by mouth 2 (two) times daily. 08/10/16   Melony Overly, MD  aspirin EC 81 MG EC tablet Take 1 tablet (81 mg total) by mouth daily. 05/27/15   Strader, Fransisco Hertz, PA-C  atorvastatin (LIPITOR) 80 MG tablet Take 1 tablet (80 mg total) by mouth daily at 6 PM. 05/27/15   Strader, [place] M, PA-C  chlorthalidone (HYGROTON) 25 MG tablet Take 1 tablet (25 mg total) by mouth daily. 08/10/16   Melony Overly, MD  HYDROcodone-acetaminophen (NORCO) 5-325 MG tablet Take 1 tablet by mouth every 6 (six) hours as needed for moderate pain. 08/10/16   Melony Overly, MD  metoprolol (LOPRESSOR) 50 MG tablet Take 1 tablet (50 mg total) by mouth 2 (two) times daily. 08/10/16   Melony Overly, MD  ondansetron (ZOFRAN) 4 MG tablet Take 1 tablet (4 mg total) by mouth every 8 (eight) hours as needed for nausea or vomiting. 08/10/16   Melony Overly, MD  vitamin B-12 (CYANOCOBALAMIN) 250 MCG tablet Take 250 mcg by mouth daily.    [provider]    Allergies    Patient has no known allergies.  Review of Systems   Review of Systems  Constitutional: Negative for chills and fever.  Gastrointestinal: Negative for abdominal pain, nausea and vomiting.  Genitourinary: Positive for frequency. Negative  for difficulty urinating, dysuria, flank pain and hematuria.       +post-void dribbling  All other systems reviewed and are negative.   Physical Exam Updated Vital Signs BP (!) 133/107 (BP Location: Left Arm)   Pulse (!) 126   Temp 97.9 F (36.6 C) (Oral)   Resp 20   SpO2 96%   Physical Exam Vitals and nursing note reviewed.  Constitutional:      General: He is not in acute distress.    Appearance: He is well-developed. He is obese. He is not ill-appearing.  HENT:     Head: Normocephalic and atraumatic.  Eyes:     General: No scleral icterus.       Right eye: No discharge.        Left eye: No  discharge.     Conjunctiva/sclera: Conjunctivae normal.     Pupils: Pupils are equal, round, and reactive to light.  Cardiovascular:     Rate and Rhythm: Tachycardia present.  Pulmonary:     Effort: Pulmonary effort is normal. No respiratory distress.     Breath sounds: Normal breath sounds.  Abdominal:     General: There is no distension.     Palpations: Abdomen is soft.     Tenderness: There is no abdominal tenderness.  Musculoskeletal:     Cervical back: Normal range of motion.  Skin:    General: Skin is warm and dry.  Neurological:     Mental Status: He is alert and oriented to person, place, and time.  Psychiatric:        Behavior: Behavior normal.     ED Results / Procedures / Treatments   Labs (all labs ordered are listed, but only abnormal results are displayed) Labs Reviewed  URINALYSIS, ROUTINE W REFLEX MICROSCOPIC - Abnormal; Notable for the following components:      Result Value   Color, Urine STRAW (*)    Glucose, UA >=500 (*)    Ketones, ur 20 (*)    All other components within normal limits  BASIC METABOLIC PANEL - Abnormal; Notable for the following components:   Sodium 122 (*)    Potassium 6.0 (*)    Chloride 82 (*)    Glucose, Bld 823 (*)    BUN 25 (*)    Creatinine, Ser 1.51 (*)    Calcium 11.8 (*)    GFR calc non Af Amer 52 (*)    Anion gap 18 (*)    All other components within normal limits  CBC WITH DIFFERENTIAL/PLATELET - Abnormal; Notable for the following components:   RBC 6.76 (*)    Hemoglobin 17.1 (*)    MCV 75.0 (*)    MCH 25.3 (*)    RDW 16.5 (*)    Abs Immature Granulocytes 0.09 (*)    All other components within normal limits  CBG MONITORING, ED - Abnormal; Notable for the following components:   Glucose-Capillary >600 (*)    All other components within normal limits    EKG None  Radiology No results found.  Procedures Procedures (including critical care time)  CRITICAL CARE Performed by: Recardo Evangelist   Total  critical care time: 30 minutes  Critical care time was exclusive of separately billable procedures and treating other patients.  Critical care was necessary to treat or prevent imminent or life-threatening deterioration.  Critical care was time spent personally by me on the following activities: development of treatment plan with patient and/or surrogate as well as nursing, discussions with consultants,  evaluation of patient's response to treatment, examination of patient, obtaining history from patient or surrogate, ordering and performing treatments and interventions, ordering and review of laboratory studies, ordering and review of radiographic studies, pulse oximetry and re-evaluation of patient's condition.   Medications Ordered in ED Medications - No data to display  ED Course  I have reviewed the triage vital signs and the nursing notes.  Pertinent labs & imaging results that were available during my care of the patient were reviewed by me and considered in my medical decision making (see chart for details).  54 year old male presents with urinary frequency and post-void incontinence. The frequency has been going on for 6 months but incontinence is new. He is tachycardic in triage with HR in the 120s. UA is pending. There is concern for hyperglycemia so CBG was added.  UA shows glucose >500. CBG is >600. Will add CBC, BMP. Fluids and insulin added.  CBC is remarkable for polycythemia (hgb 17.1). BMP is remarkable for glucose of 823 with AKI (SCr 1.5), and elevation anion gap (18). Also of note K is 6. Will add EKG.   Pt was transferred to [facility] by knowledge by nursing staff. Care signed out to Dr. Roderic Palau who will assume care.  MDM Rules/Calculators/A&P                      Final Clinical Impression(s) / ED Diagnoses Final diagnoses:  Diabetic ketoacidosis without coma associated with other specified diabetes mellitus [facility])    Rx / DC Orders ED Discharge Orders    None        Recardo Evangelist, PA-C 08/17/19 1134    Recardo Evangelist, PA-C 08/17/19 1134    Milton Ferguson, MD 08/19/19 0825    Recardo Evangelist, PA-C 09/13/19 0800    Milton Ferguson, MD 09/14/19 [id number]

## 2019-08-17 NOTE — H&P (Signed)
History and Physical    BRASEN ICE [id number] DOB: 06-Nov-1965 DOA: 08/17/2019  PCP: Patient, No Pcp Per   Patient coming from: Home  I have personally briefly reviewed patient's old medical records in [place]  Chief Complaint: Polyuria  HPI: Riley Hood is a 54 y.o. male with medical history significant of HTN not on meds, pre-diabetes, presented with polydipsia and polyuria for 2 weeks.  Denied any nauseous vomiting, no diarrhea, no chest pain, no short of breath.  Denied any dysuria, no fever chills. He feels like he can never completely empty his bladder. He recently bought some OTC AZO without significant improvement. He has not been eating well for 3 days but symptoms have seem to become worse. He previously went to [facility] but hasn't seen them in years. He denies flank pain, penis or testicular pain, dysuria or hematuria.   ED Course: Blood glucose more than 900, potassium 6.0, calcium 11.8, received 1 L IV bolus and IV insulin  Review of Systems: As per HPI otherwise 10 point review of systems negative.    Past Medical History:  Diagnosis Date  . Hypertension     History reviewed. No pertinent surgical history.   reports that he has never smoked. He has never used smokeless tobacco. He reports current alcohol use. He reports that he does not use drugs.  No Known Allergies  DM runs in both parents.  Prior to Admission medications   Medication Sig Start Date End Date Taking? Authorizing Provider  Ascorbic Acid (VITA-C PO) Take 1 tablet by mouth daily.   Yes [provider]  vitamin B-12 (CYANOCOBALAMIN) 250 MCG tablet Take 250 mcg by mouth daily.   Yes [provider]  VITAMIN D PO Take 1 tablet by mouth daily.   Yes [provider]  VITAMIN E PO Take 1 capsule by mouth daily.   Yes [provider]  amLODipine (NORVASC) 10 MG tablet Take 1 tablet (10 mg total) by mouth daily. Patient not taking:  Reported on 08/17/2019 08/10/16   Melony Overly, MD  amoxicillin (AMOXIL) 875 MG tablet Take 1 tablet (875 mg total) by mouth 2 (two) times daily. Patient not taking: Reported on 08/17/2019 08/10/16   Melony Overly, MD  aspirin EC 81 MG EC tablet Take 1 tablet (81 mg total) by mouth daily. Patient not taking: Reported on 08/17/2019 05/27/15   Erma Heritage, PA-C  atorvastatin (LIPITOR) 80 MG tablet Take 1 tablet (80 mg total) by mouth daily at 6 PM. Patient not taking: Reported on 08/17/2019 05/27/15   Ahmed Prima, Fransisco Hertz, PA-C  chlorthalidone (HYGROTON) 25 MG tablet Take 1 tablet (25 mg total) by mouth daily. Patient not taking: Reported on 08/17/2019 08/10/16   Melony Overly, MD  HYDROcodone-acetaminophen [facility]) 5-325 MG tablet Take 1 tablet by mouth every 6 (six) hours as needed for moderate pain. Patient not taking: Reported on 08/17/2019 08/10/16   Melony Overly, MD  metoprolol (LOPRESSOR) 50 MG tablet Take 1 tablet (50 mg total) by mouth 2 (two) times daily. Patient not taking: Reported on 08/17/2019 08/10/16   Melony Overly, MD  ondansetron (ZOFRAN) 4 MG tablet Take 1 tablet (4 mg total) by mouth every 8 (eight) hours as needed for nausea or vomiting. Patient not taking: Reported on 08/17/2019 08/10/16   Melony Overly, MD    Physical Exam: Vitals:   08/17/19 0852 08/17/19 1038 08/17/19 1245  BP: (!) 133/107 (!) 124/103  124/86  Pulse: (!) 126 (!) 114   Resp: 20 20 14   Temp: 97.9 F (36.6 C)    TempSrc: Oral    SpO2: 96% 97%     Constitutional: NAD, calm, comfortable Vitals:   08/17/19 0852 08/17/19 1038 08/17/19 1245  BP: (!) 133/107 (!) 124/103 124/86  Pulse: (!) 126 (!) 114   Resp: 20 20 14   Temp: 97.9 F (36.6 C)    TempSrc: Oral    SpO2: 96% 97%    Eyes: PERRL, lids and conjunctivae normal ENMT: Mucous membranes are dry. Posterior pharynx clear of any exudate or lesions.Normal dentition.  Neck: normal, supple, no masses, no thyromegaly Respiratory: clear to auscultation  bilaterally, no wheezing, no crackles. Normal respiratory effort. No accessory muscle use.  Cardiovascular: Regular rate and rhythm, no murmurs / rubs / gallops. No extremity edema. 2+ pedal pulses. No carotid bruits.  Abdomen: no tenderness, no masses palpated. No hepatosplenomegaly. Bowel sounds positive.  Musculoskeletal: no clubbing / cyanosis. No joint deformity upper and lower extremities. Good ROM, no contractures. Normal muscle tone.  Skin: no rashes, lesions, ulcers. No induration Neurologic: CN 2-12 grossly intact. Sensation intact, DTR normal. Strength 5/5 in all 4.  Psychiatric: Normal judgment and insight. Alert and oriented x 3. Normal mood.     Labs on Admission: I have personally reviewed following labs and imaging studies  CBC: Recent Labs  Lab 08/17/19 0952  WBC 8.4  NEUTROABS 5.5  HGB 17.1*  HCT 50.7  MCV 75.0*  PLT 123456   Basic Metabolic Panel: Recent Labs  Lab 08/17/19 0952  NA 122*  K 6.0*  CL 82*  CO2 22  GLUCOSE 823*  BUN 25*  CREATININE 1.51*  CALCIUM 11.8*   GFR: CrCl cannot be calculated (Unknown ideal weight.). Liver Function Tests: No results for input(s): AST, ALT, ALKPHOS, BILITOT, PROT, ALBUMIN in the last 168 hours. No results for input(s): LIPASE, AMYLASE in the last 168 hours. No results for input(s): AMMONIA in the last 168 hours. Coagulation Profile: No results for input(s): INR, PROTIME in the last 168 hours. Cardiac Enzymes: No results for input(s): CKTOTAL, CKMB, CKMBINDEX, TROPONINI in the last 168 hours. BNP (last 3 results) No results for input(s): PROBNP in the last 8760 hours. HbA1C: No results for input(s): HGBA1C in the last 72 hours. CBG: Recent Labs  Lab 08/17/19 0936 08/17/19 1215  GLUCAP >600* >600*   Lipid Profile: No results for input(s): CHOL, HDL, LDLCALC, TRIG, CHOLHDL, LDLDIRECT in the last 72 hours. Thyroid Function Tests: No results for input(s): TSH, T4TOTAL, FREET4, T3FREE, THYROIDAB in the last 72  hours. Anemia Panel: No results for input(s): VITAMINB12, FOLATE, FERRITIN, TIBC, IRON, RETICCTPCT in the last 72 hours. Urine analysis:    Component Value Date/Time   COLORURINE STRAW (A) 08/17/2019 0906   APPEARANCEUR CLEAR 08/17/2019 0906   LABSPEC 1.027 08/17/2019 0906   PHURINE 6.0 08/17/2019 0906   GLUCOSEU >=500 (A) 08/17/2019 0906   HGBUR NEGATIVE 08/17/2019 0906   BILIRUBINUR NEGATIVE 08/17/2019 0906   KETONESUR 20 (A) 08/17/2019 0906   PROTEINUR NEGATIVE 08/17/2019 0906   UROBILINOGEN 1.0 04/24/2009 0137   NITRITE NEGATIVE 08/17/2019 0906   LEUKOCYTESUR NEGATIVE 08/17/2019 0906    Radiological Exams on Admission: No results found.  EKG: Independently reviewed.  No acute ST-T changes  Assessment/Plan Active Problems:   Type 2 diabetes mellitus with other specified complication (HCC)   Hyperosmolar hyperglycemic state (HHS) ([place])   HHS with new onset diabetes Serum osmolarity, Start insulin  drip, will give 1 more normal saline IV bolus. Check HbA1c, check lipid panel. Consult diabetes education and transition care, consult dietitian for calorie education. Once sugar controlled, likely can be discharged home tomorrow, likely will need insulin.  Hyperkalemia, from severe dehydration. No EKG changes, on insulin drip, repeat BMP in 4 hours..  AKI, from severe dehydration, received normal saline boluses x2, keep maintaining IV fluid.  Hypercalcemia, from severe dehydration, continue IV fluid recheck BMP in 4 hours.  Hold further study for now.  Uncontrolled hypertension, as needed hydralazine for now, consider restart HCTZ plus minus ACE inhibitor once function stabilized.  Morbid obesity, dietitian, outpatient bariatric evaluation.  Pseudohyponatremia, corrected sugar level and recheck in 4 hours.    DVT prophylaxis: Heparin subcu  code Status: Full code Family Communication: None at bedside Disposition Plan: Likely can be discharged home tomorrow Consults  called: None Admission status: PCU   Lequita Halt MD [facility] Pager [phone]  If 7PM-7AM, please contact night-coverage [email or web address] Password [facility]  08/17/2019, 12:53 PM

## 2019-08-17 NOTE — ED Provider Notes (Signed)
[facility] EMERGENCY DEPARTMENT Provider Note   CSN: [id number] Arrival date & time: 08/17/19  0846     History Chief Complaint  Patient presents with  . Urinary Incontinence    SHRISH STATE is a 54 y.o. male.  Patient complains of urinating a lot and being very thirsty for a number weeks.  He is very weak now  The history is provided by the patient. No language interpreter was used.  Weakness Severity:  Moderate Onset quality:  Sudden Timing:  Constant Progression:  Worsening Chronicity:  New Context: not alcohol use   Relieved by:  Nothing Worsened by:  Nothing Ineffective treatments:  None tried Associated symptoms: frequency   Associated symptoms: no abdominal pain, no chest pain, no cough, no diarrhea, no headaches and no seizures        Past Medical History:  Diagnosis Date  . Hypertension     Patient Active Problem List   Diagnosis Date Noted  . Prediabetes 06/05/2015  . Obesity 06/05/2015  . Murmur, cardiac 06/05/2015  . Chest pain with moderate risk for cardiac etiology 05/26/2015  . Hypertension 05/25/2015  . Hypertensive urgency 05/25/2015    History reviewed. No pertinent surgical history.     No family history on file.  Social History   Tobacco Use  . Smoking status: Never Smoker  . Smokeless tobacco: Never Used  Substance Use Topics  . Alcohol use: Yes    Comment: occasional beer  . Drug use: No    Home Medications Prior to Admission medications   Medication Sig Start Date End Date Taking? Authorizing Provider  Ascorbic Acid (VITA-C PO) Take 1 tablet by mouth daily.   Yes [provider]  vitamin B-12 (CYANOCOBALAMIN) 250 MCG tablet Take 250 mcg by mouth daily.   Yes [provider]  VITAMIN D PO Take 1 tablet by mouth daily.   Yes [provider]  VITAMIN E PO Take 1 capsule by mouth daily.   Yes [provider]  amLODipine (NORVASC) 10 MG tablet Take 1 tablet (10 mg  total) by mouth daily. Patient not taking: Reported on 08/17/2019 08/10/16   Melony Overly, MD  amoxicillin (AMOXIL) 875 MG tablet Take 1 tablet (875 mg total) by mouth 2 (two) times daily. Patient not taking: Reported on 08/17/2019 08/10/16   Melony Overly, MD  aspirin EC 81 MG EC tablet Take 1 tablet (81 mg total) by mouth daily. Patient not taking: Reported on 08/17/2019 05/27/15   Erma Heritage, PA-C  atorvastatin (LIPITOR) 80 MG tablet Take 1 tablet (80 mg total) by mouth daily at 6 PM. Patient not taking: Reported on 08/17/2019 05/27/15   Ahmed Prima, Fransisco Hertz, PA-C  chlorthalidone (HYGROTON) 25 MG tablet Take 1 tablet (25 mg total) by mouth daily. Patient not taking: Reported on 08/17/2019 08/10/16   Melony Overly, MD  HYDROcodone-acetaminophen [facility]) 5-325 MG tablet Take 1 tablet by mouth every 6 (six) hours as needed for moderate pain. Patient not taking: Reported on 08/17/2019 08/10/16   Melony Overly, MD  metoprolol (LOPRESSOR) 50 MG tablet Take 1 tablet (50 mg total) by mouth 2 (two) times daily. Patient not taking: Reported on 08/17/2019 08/10/16   Melony Overly, MD  ondansetron (ZOFRAN) 4 MG tablet Take 1 tablet (4 mg total) by mouth every 8 (eight) hours as needed for nausea or vomiting. Patient not taking: Reported on 08/17/2019 08/10/16   Melony Overly, MD    Allergies  Patient has no known allergies.  Review of Systems   Review of Systems  Constitutional: Negative for appetite change and fatigue.  HENT: Negative for congestion, ear discharge and sinus pressure.   Eyes: Negative for discharge.  Respiratory: Negative for cough.   Cardiovascular: Negative for chest pain.  Gastrointestinal: Negative for abdominal pain and diarrhea.  Genitourinary: Positive for frequency. Negative for hematuria.  Musculoskeletal: Negative for back pain.  Skin: Negative for rash.  Neurological: Positive for weakness. Negative for seizures and headaches.  Psychiatric/Behavioral: Negative for  hallucinations.    Physical Exam Updated Vital Signs BP (!) 124/103   Pulse (!) 114   Temp 97.9 F (36.6 C) (Oral)   Resp 20   SpO2 97%   Physical Exam Vitals and nursing note reviewed.  Constitutional:      Appearance: He is well-developed.  HENT:     Head: Normocephalic.     Nose: Nose normal.     Mouth/Throat:     Comments: Dry mucous membrane Eyes:     General: No scleral icterus.    Conjunctiva/sclera: Conjunctivae normal.  Neck:     Thyroid: No thyromegaly.  Cardiovascular:     Rate and Rhythm: Normal rate and regular rhythm.     Heart sounds: No murmur. No friction rub. No gallop.   Pulmonary:     Breath sounds: No stridor. No wheezing or rales.  Chest:     Chest wall: No tenderness.  Abdominal:     General: There is no distension.     Tenderness: There is no abdominal tenderness. There is no rebound.  Musculoskeletal:        General: Normal range of motion.     Cervical back: Neck supple.  Lymphadenopathy:     Cervical: No cervical adenopathy.  Skin:    Findings: No erythema or rash.  Neurological:     Mental Status: He is alert and oriented to person, place, and time.     Motor: No abnormal muscle tone.     Coordination: Coordination normal.  Psychiatric:        Behavior: Behavior normal.     ED Results / Procedures / Treatments   Labs (all labs ordered are listed, but only abnormal results are displayed) Labs Reviewed  URINALYSIS, ROUTINE W REFLEX MICROSCOPIC - Abnormal; Notable for the following components:      Result Value   Color, Urine STRAW (*)    Glucose, UA >=500 (*)    Ketones, ur 20 (*)    All other components within normal limits  BASIC METABOLIC PANEL - Abnormal; Notable for the following components:   Sodium 122 (*)    Potassium 6.0 (*)    Chloride 82 (*)    Glucose, Bld 823 (*)    BUN 25 (*)    Creatinine, Ser 1.51 (*)    Calcium 11.8 (*)    GFR calc non Af Amer 52 (*)    Anion gap 18 (*)    All other components within  normal limits  CBC WITH DIFFERENTIAL/PLATELET - Abnormal; Notable for the following components:   RBC 6.76 (*)    Hemoglobin 17.1 (*)    MCV 75.0 (*)    MCH 25.3 (*)    RDW 16.5 (*)    Abs Immature Granulocytes 0.09 (*)    All other components within normal limits  CBG MONITORING, ED - Abnormal; Notable for the following components:   Glucose-Capillary >600 (*)    All other components within normal limits  CBG MONITORING, ED - Abnormal; Notable for the following components:   Glucose-Capillary >600 (*)    All other components within normal limits  SARS CORONAVIRUS 2 (TAT 6-24 HRS)    EKG None  Radiology No results found.  Procedures Procedures (including critical care time)  Medications Ordered in ED Medications  insulin aspart (novoLOG) injection 10 Units (has no administration in time range)  sodium chloride 0.9 % bolus 1,000 mL (1,000 mLs Intravenous New Bag/Given 08/17/19 0952)  insulin aspart (novoLOG) injection 15 Units (15 Units Subcutaneous Given 08/17/19 1002)    ED Course  I have reviewed the triage vital signs and the nursing notes.  Pertinent labs & imaging results that were available during my care of the patient were reviewed by me and considered in my medical decision making (see chart for details).    MDM Rules/Calculators/A&P                     Patient with new onset diabetes AKI hyperosmolar state.  Patient will be admitted to the hospitalist Final Clinical Impression(s) / ED Diagnoses Final diagnoses:  Diabetic ketoacidosis without coma associated with other specified diabetes mellitus [facility])    Rx / DC Orders ED Discharge Orders    None       Milton Ferguson, MD 08/17/19 1227

## 2019-08-17 NOTE — Progress Notes (Signed)
No significant decrease of Ca level with improvement of kidney function and multiple boluses, will order PTH, PTH like peptide, and 25 OH VitD for AM.

## 2019-08-17 NOTE — ED Notes (Signed)
PA notified of cbg

## 2019-08-17 NOTE — Progress Notes (Signed)
Received call from lab at 1900. Covid test resulted positive. Patient moved to 2W29 from 2W13 and COVID isolation precautions initiated. Pt denies SOB, cough, and fever.

## 2019-08-18 ENCOUNTER — Inpatient Hospital Stay (HOSPITAL_COMMUNITY): Payer: Self-pay

## 2019-08-18 DIAGNOSIS — E1165 Type 2 diabetes mellitus with hyperglycemia: Secondary | ICD-10-CM

## 2019-08-18 DIAGNOSIS — E1169 Type 2 diabetes mellitus with other specified complication: Secondary | ICD-10-CM

## 2019-08-18 DIAGNOSIS — R9431 Abnormal electrocardiogram [ECG] [EKG]: Secondary | ICD-10-CM

## 2019-08-18 DIAGNOSIS — E1100 Type 2 diabetes mellitus with hyperosmolarity without nonketotic hyperglycemic-hyperosmolar coma (NKHHC): Principal | ICD-10-CM

## 2019-08-18 LAB — GLUCOSE, CAPILLARY
Glucose-Capillary: 230 mg/dL — ABNORMAL HIGH (ref 70–99)
Glucose-Capillary: 381 mg/dL — ABNORMAL HIGH (ref 70–99)
Glucose-Capillary: 420 mg/dL — ABNORMAL HIGH (ref 70–99)
Glucose-Capillary: 440 mg/dL — ABNORMAL HIGH (ref 70–99)
Glucose-Capillary: 410 mg/dL — ABNORMAL HIGH (ref 70–99)
Glucose-Capillary: 428 mg/dL — ABNORMAL HIGH (ref 70–99)

## 2019-08-18 LAB — BASIC METABOLIC PANEL
Anion gap: 11 (ref 5–15)
Anion gap: 10 (ref 5–15)
BUN: 22 mg/dL — ABNORMAL HIGH (ref 6–20)
BUN: 23 mg/dL — ABNORMAL HIGH (ref 6–20)
CO2: 25 mmol/L (ref 22–32)
CO2: 24 mmol/L (ref 22–32)
Calcium: 9.8 mg/dL (ref 8.9–10.3)
Calcium: 9.8 mg/dL (ref 8.9–10.3)
Chloride: 101 mmol/L (ref 98–111)
Chloride: 97 mmol/L — ABNORMAL LOW (ref 98–111)
Creatinine, Ser: 1.02 mg/dL (ref 0.61–1.24)
Creatinine, Ser: 1.25 mg/dL — ABNORMAL HIGH (ref 0.61–1.24)
GFR calc Af Amer: >60 mL/min (ref >60)
GFR calc Af Amer: >60 mL/min (ref >60)
GFR calc non Af Amer: >60 mL/min (ref >60)
GFR calc non Af Amer: >60 mL/min (ref >60)
Glucose, Bld: 273 mg/dL — ABNORMAL HIGH (ref 70–99)
Glucose, Bld: 442 mg/dL — ABNORMAL HIGH (ref 70–99)
Potassium: 4.3 mmol/L (ref 3.5–5.1)
Potassium: 4.9 mmol/L (ref 3.5–5.1)
Sodium: 137 mmol/L (ref 135–145)
Sodium: 131 mmol/L — ABNORMAL LOW (ref 135–145)

## 2019-08-18 LAB — ECHOCARDIOGRAM LIMITED

## 2019-08-18 LAB — VITAMIN D 25 HYDROXY (VIT D DEFICIENCY, FRACTURES): Vit D, 25-Hydroxy: 14 ng/mL — ABNORMAL LOW (ref 30–100)

## 2019-08-18 MED ORDER — INSULIN GLARGINE 100 UNIT/ML ~~LOC~~ SOLN: 15.0000 [IU] | Freq: Every day | SUBCUTANEOUS | Status: DC

## 2019-08-18 MED ORDER — INSULIN GLARGINE 100 UNIT/ML ~~LOC~~ SOLN
15.0000 [IU] | Freq: Two times a day (BID) | SUBCUTANEOUS | Status: DC
  Administered 2019-08-18: 15 [IU] via SUBCUTANEOUS
  Filled 2019-08-18 (×3): qty 0.15

## 2019-08-18 MED ORDER — INSULIN ASPART 100 UNIT/ML ~~LOC~~ SOLN
10.0000 [IU] | Freq: Once | SUBCUTANEOUS | Status: AC
  Administered 2019-08-18: 10 [IU] via SUBCUTANEOUS

## 2019-08-18 MED ORDER — INSULIN ASPART 100 UNIT/ML ~~LOC~~ SOLN
8.0000 [IU] | Freq: Three times a day (TID) | SUBCUTANEOUS | Status: DC
  Administered 2019-08-19: 8 [IU] via SUBCUTANEOUS

## 2019-08-18 MED ORDER — INSULIN ASPART 100 UNIT/ML ~~LOC~~ SOLN
5.0000 [IU] | Freq: Three times a day (TID) | SUBCUTANEOUS | Status: DC
  Administered 2019-08-18: 5 [IU] via SUBCUTANEOUS

## 2019-08-18 MED ORDER — INSULIN ASPART 100 UNIT/ML ~~LOC~~ SOLN
15.0000 [IU] | Freq: Once | SUBCUTANEOUS | Status: AC
  Administered 2019-08-19: 15 [IU] via SUBCUTANEOUS

## 2019-08-18 MED ORDER — INSULIN GLARGINE 100 UNIT/ML ~~LOC~~ SOLN
10.0000 [IU] | Freq: Every day | SUBCUTANEOUS | Status: DC
  Filled 2019-08-18: qty 0.1

## 2019-08-18 MED ORDER — INSULIN ASPART 100 UNIT/ML ~~LOC~~ SOLN
0.0000 [IU] | Freq: Three times a day (TID) | SUBCUTANEOUS | Status: DC
  Administered 2019-08-18: 20 [IU] via SUBCUTANEOUS
  Administered 2019-08-19: 15 [IU] via SUBCUTANEOUS
  Administered 2019-08-19: 20 [IU] via SUBCUTANEOUS
  Administered 2019-08-19: 4 [IU] via SUBCUTANEOUS
  Administered 2019-08-20 (×2): 7 [IU] via SUBCUTANEOUS

## 2019-08-18 MED ORDER — ATORVASTATIN CALCIUM 40 MG PO TABS
40.0000 mg | ORAL_TABLET | Freq: Every day | ORAL | Status: DC
  Administered 2019-08-18 – 2019-08-19 (×2): 40 mg via ORAL
  Filled 2019-08-18 (×2): qty 1

## 2019-08-18 NOTE — Progress Notes (Signed)
Inpatient Diabetes Program Recommendations  AACE/ADA: New Consensus Statement on Inpatient Glycemic Control   Target Ranges:  Prepandial:   less than 140 mg/dL      Peak postprandial:   less than 180 mg/dL (1-2 hours)      Critically ill patients:  140 - 180 mg/dL   Results for Riley Hood, Riley Hood (MRN [id number]) as of 08/18/2019 11:08  Ref. Range 08/18/2019 01:10 08/18/2019 07:57  Glucose-Capillary Latest Ref Range: 70 - 99 mg/dL 230 (H)  Novolog 2 units 381 (H)  Novolog 15 units   Results for Riley Hood, Riley Hood (MRN [id number]) as of 08/18/2019 11:08  Ref. Range 08/17/2019 09:36 08/17/2019 12:15 08/17/2019 13:21 08/17/2019 14:18 08/17/2019 14:39 08/17/2019 15:18 08/17/2019 16:15 08/17/2019 17:07 08/17/2019 18:20 08/17/2019 19:37 08/17/2019 20:30 08/17/2019 21:30 08/17/2019 22:33  Glucose-Capillary Latest Ref Range: 70 - 99 mg/dL >600 (HH) >600 (HH) >600 (HH) 534 (HH) 466 (H) 456 (H) 354 (H) 294 (H) 258 (H) 213 (H) 222 (H) 193 (H) 240 (H)  Lantus 10 units @ 23:15  Results for Riley Hood, Riley Hood (MRN [id number]) as of 08/18/2019 11:08  Ref. Range 05/27/2015 04:16 08/17/2019 13:20  Hemoglobin A1C Latest Ref Range: 4.8 - 5.6 % 6.4 (H) 11.8 (H)   Review of Glycemic Control  Diabetes history: PreDM Outpatient Diabetes medications: None Current orders for Inpatient glycemic control: Novolog 0-15 units TID with meals, Novolog 0-5 units QHS, Novolog 5 units TID with meals  Inpatient Diabetes Program Recommendations:    Insulin-Basal: Noted patient received Lantus 10 units at 23:15 last night at time of transition off IV insulin. Please consider ordering Lantus 20 units QHS.  A1C: Noted A1C of 11.8% on 08/17/19 indicating an average glucose of 292 mg/dl over the past 2-3 months.  NOTE: Noted consult for Diabetes Coordinator. Diabetes Coordinator is not on campus over the weekend but available by pager from 8am to 5pm for questions or concerns. Spoke with patient over the phone about new diabetes diagnosis.   Patient reports that he has not ever been told he had DM or preDM. Patient has not seen a provider in many years and has no insurance. Patient confirms that he has been having symptoms of hyperglycemia (polyuria, polydipsia, blurry vision, increased tiredness).   Discussed A1C results (11.8% on 08/17/19) and explained what an A1C is and informed patient that his current A1C indicates an average glucose of 292 mg/dl over the past 2-3 months. Discussed basic pathophysiology of DM Type 2, basic home care, importance of checking CBGs and maintaining good CBG control to prevent long-term and short-term complications. Reviewed glucose and A1C goals. Reviewed signs and symptoms of hyperglycemia and hypoglycemia along with treatment for both. Discussed impact of nutrition, exercise, stress, sickness, and medications on diabetes control. Patient sates he has not received Living Well with diabetes booklet yet but nurse has told him she will bring it in to him shortly.  Encouraged patient to read through entire book once received.  Informed patient that TOC should be talking with him to assist with follow up and medication needs.  Informed patient he could go to [employer] to get the Reli-On Classic glucometer for $9 and a box of 50 Reli-On test strips for $9. Asked patient to check glucose as MD instructs and to keep a log book of glucose readings. Explained how the doctor he follows up with can use the log book to continue to make adjustments with DM medications if needed. Patient states that MD has told him he will be discharged  on insulin.  Informed patient that he should also be receiving an insulin starter kit and encouraged patient to read the information once received. Informed patient that bedside RNs will be reviewing insulin administration and asking him to self administer insulin to ensure proper technique and ability to administer self insulin shots.  Patient verbalized understanding of information discussed and he  states that he has no further questions at this time related to diabetes.  RNs to provide ongoing basic DM education at bedside with this patient and engage patient to actively check blood glucose and administer insulin injections.   Thanks, Barnie Alderman, RN, MSN, CDE Diabetes Coordinator Inpatient Diabetes Program [phone] (Team Pager from 8am to 5pm)

## 2019-08-18 NOTE — Progress Notes (Signed)
  Echocardiogram 2D Echocardiogram has been performed.  Riley Hood 08/18/2019, 3:36 PM

## 2019-08-18 NOTE — Progress Notes (Signed)
Per Dr. Karleen Hampshire patient is med-surg. Verbal order to d/c cardiac monitoring.

## 2019-08-18 NOTE — Progress Notes (Signed)
PROGRESS NOTE    Riley Hood  XTA:[id number] DOB: 1965-08-06 DOA: 08/17/2019 PCP: Patient, No Pcp Per    Brief Narrative:  Old gentleman with prior history of hypertension and prediabetes presents with polydipsia and polyuria for 2 weeks.  On arrival to ED he was tested positive for Covid and his CBG was in 900.  Was also found to be in AKI. Assessment & Plan:   Active Problems:   Type 2 diabetes mellitus with other specified complication (HCC)   Hyperosmolar hyperglycemic state (HHS) ([place])   Uncontrolled DM with hyperglycemia:  CBG (last 3)  Recent Labs    08/18/19 0757 08/18/19 1219 08/18/19 1632  GLUCAP 381* 420* 440*   Started the patient on lantus 15 BID , resistant scale SSI. And Novolog 8 UNITS  TIDAC.   AKI:  Resolved. Probably sec to dehydration.  Resolved with hydration.    Essential hypertension:  Well controlled.    COVID 19 positive.  No respiratory or GI symptoms.  No indication for remdisivir or steroids.    DVT prophylaxis: heparin.  Code Status: full code.  Family Communication:none at bedside.  Disposition Plan:  . Patient came from: HOME.            Marland Kitchen Anticipated d/c place:HOME. . Barriers to d/c OR conditions which need to be met to effect a safe d/c: pending improvement in CBG'S   Consultants:   None.   Procedures: echocardiogram.  Antimicrobials: none.   Subjective: No chest pain or sob.   Objective: Vitals:   08/17/19 2031 08/17/19 2356 08/18/19 0346 08/18/19 0803  BP: 116/84 103/87 131/72 (!) 121/91  Pulse: (!) 103 97 94 95  Resp: '18 12 12 20  '$ Temp: 97.7 F (36.5 C) 97.6 F (36.4 C) 97.7 F (36.5 C) 97.8 F (36.6 C)  TempSrc: Oral Oral Oral Oral  SpO2: 94% 91% 92% 94%    Intake/Output Summary (Last 24 hours) at 08/18/2019 1633 Last data filed at 08/18/2019 1230 Gross per 24 hour  Intake --  Output 901 ml  Net -901 ml   There were no vitals filed for this visit.  Examination:  General exam: Appears calm  and comfortable  Respiratory system: Clear to auscultation. Respiratory effort normal. Cardiovascular system: S1 & S2 heard, RRR. Marland Kitchen No pedal edema. Gastrointestinal system: Abdomen is nondistended, soft and nontender Normal bowel sounds heard. Central nervous system: Alert and oriented. No focal neurological deficits. Extremities: Symmetric 5 x 5 power. Skin: No rashes, lesions or ulcers Psychiatry: . Mood & affect appropriate.     Data Reviewed: I have personally reviewed following labs and imaging studies  CBC: Recent Labs  Lab 08/17/19 0952  WBC 8.4  NEUTROABS 5.5  HGB 17.1*  HCT 50.7  MCV 75.0*  PLT 655   Basic Metabolic Panel: Recent Labs  Lab 08/17/19 0952 08/17/19 1320 08/17/19 1720 08/17/19 2107 08/18/19 0452  NA 122* 129* 137 137 137  K 6.0* 5.1 4.2 4.2 4.3  CL 82* 88* 97* 99 101  CO2 '22 23 28 27 25  '$ GLUCOSE 823* 648* 295* 223* 273*  BUN 25* 28* 24* 22* 22*  CREATININE 1.51* 1.60* 1.35* 1.15 1.02  CALCIUM 11.8* 11.4* 11.1* 10.8* 9.8   GFR: CrCl cannot be calculated (Unknown ideal weight.). Liver Function Tests: No results for input(s): AST, ALT, ALKPHOS, BILITOT, PROT, ALBUMIN in the last 168 hours. No results for input(s): LIPASE, AMYLASE in the last 168 hours. No results for input(s): AMMONIA in the last 168 hours.  Coagulation Profile: No results for input(s): INR, PROTIME in the last 168 hours. Cardiac Enzymes: No results for input(s): CKTOTAL, CKMB, CKMBINDEX, TROPONINI in the last 168 hours. BNP (last 3 results) No results for input(s): PROBNP in the last 8760 hours. HbA1C: Recent Labs    08/17/19 1320  HGBA1C 11.8*   CBG: Recent Labs  Lab 08/17/19 2130 08/17/19 2233 08/18/19 0110 08/18/19 0757 08/18/19 1219  GLUCAP 193* 240* 230* 381* 420*   Lipid Profile: Recent Labs    08/17/19 1320  CHOL 356*  HDL 37*  LDLCALC UNABLE TO CALCULATE IF TRIGLYCERIDE OVER 400 mg/dL  TRIG 620*  CHOLHDL 9.6  LDLDIRECT 162.9*   Thyroid Function  Tests: No results for input(s): TSH, T4TOTAL, FREET4, T3FREE, THYROIDAB in the last 72 hours. Anemia Panel: No results for input(s): VITAMINB12, FOLATE, FERRITIN, TIBC, IRON, RETICCTPCT in the last 72 hours. Sepsis Labs: No results for input(s): PROCALCITON, LATICACIDVEN in the last 168 hours.  Recent Results (from the past 240 hour(s))  SARS CORONAVIRUS 2 (TAT 6-24 HRS) Nasopharyngeal Nasopharyngeal Swab     Status: Abnormal   Collection Time: 08/17/19 11:54 AM   Specimen: Nasopharyngeal Swab  Result Value Ref Range Status   SARS Coronavirus 2 POSITIVE (A) NEGATIVE Final    Comment: RESULT CALLED TO, READ BACK BY AND VERIFIED WITH: Alphia Moh RN.'@1900'$  ON 1.30.21 BY TCALDWELL MT. (NOTE) SARS-CoV-2 target nucleic acids are DETECTED. The SARS-CoV-2 RNA is generally detectable in upper and lower respiratory specimens during the acute phase of infection. Positive results are indicative of the presence of SARS-CoV-2 RNA. Clinical correlation with patient history and other diagnostic information is  necessary to determine patient infection status. Positive results do not rule out bacterial infection or co-infection with other viruses.  The expected result is Negative. Fact Sheet for Patients: SugarRoll.be Fact Sheet for Healthcare Providers: https://www.woods-mathews.com/ This test is not yet approved or cleared by the [place] FDA and  has been authorized for detection and/or diagnosis of SARS-CoV-2 by FDA under an Emergency Use Authorization (EUA). This EUA will remain  in effect (meaning this test can be u sed) for the duration of the COVID-19 declaration under Section 564(b)(1) of the Act, 21 U.S.C. section 360bbb-3(b)(1), unless the authorization is terminated or revoked sooner. Performed at [facility], [place] [address], [place], [place] [address]          Radiology Studies: No results found.      Scheduled Meds: .  heparin  5,000 Units Subcutaneous Q8H  . insulin aspart  0-15 Units Subcutaneous TID WC  . insulin aspart  0-5 Units Subcutaneous QHS  . insulin aspart  5 Units Subcutaneous TID WC  . insulin glargine  10 Units Subcutaneous QHS   Continuous Infusions: . sodium chloride 75 mL/hr at 08/17/19 2319     LOS: 1 day       Hosie Poisson, MD [facility]   To contact the attending provider between 7A-7P or the covering provider during after hours 7P-7A, please log into the web site [email or web address] and access using universal [place] password for that web site. If you do not have the password, please call the hospital operator.  08/18/2019, 4:33 PM

## 2019-08-19 LAB — BASIC METABOLIC PANEL
Anion gap: 11 (ref 5–15)
Anion gap: 9 (ref 5–15)
BUN: 18 mg/dL (ref 6–20)
BUN: 16 mg/dL (ref 6–20)
CO2: 27 mmol/L (ref 22–32)
CO2: 23 mmol/L (ref 22–32)
Calcium: 9.7 mg/dL (ref 8.9–10.3)
Calcium: 9.1 mg/dL (ref 8.9–10.3)
Chloride: 99 mmol/L (ref 98–111)
Chloride: 99 mmol/L (ref 98–111)
Creatinine, Ser: 0.99 mg/dL (ref 0.61–1.24)
Creatinine, Ser: 1.06 mg/dL (ref 0.61–1.24)
GFR calc Af Amer: >60 mL/min (ref >60)
GFR calc Af Amer: >60 mL/min (ref >60)
GFR calc non Af Amer: >60 mL/min (ref >60)
GFR calc non Af Amer: >60 mL/min (ref >60)
Glucose, Bld: 262 mg/dL — ABNORMAL HIGH (ref 70–99)
Glucose, Bld: 424 mg/dL — ABNORMAL HIGH (ref 70–99)
Potassium: 4.4 mmol/L (ref 3.5–5.1)
Potassium: 4.8 mmol/L (ref 3.5–5.1)
Sodium: 137 mmol/L (ref 135–145)
Sodium: 131 mmol/L — ABNORMAL LOW (ref 135–145)

## 2019-08-19 LAB — GLUCOSE, CAPILLARY
Glucose-Capillary: 244 mg/dL — ABNORMAL HIGH (ref 70–99)
Glucose-Capillary: 329 mg/dL — ABNORMAL HIGH (ref 70–99)
Glucose-Capillary: 425 mg/dL — ABNORMAL HIGH (ref 70–99)
Glucose-Capillary: 192 mg/dL — ABNORMAL HIGH (ref 70–99)
Glucose-Capillary: 214 mg/dL — ABNORMAL HIGH (ref 70–99)

## 2019-08-19 LAB — PTH, INTACT AND CALCIUM
Calcium, Total (PTH): 9.8 mg/dL (ref 8.7–10.2)
PTH: 27 pg/mL (ref 15–65)

## 2019-08-19 LAB — PTH-RELATED PEPTIDE

## 2019-08-19 MED ORDER — INSULIN ASPART 100 UNIT/ML ~~LOC~~ SOLN
10.0000 [IU] | Freq: Three times a day (TID) | SUBCUTANEOUS | Status: DC
  Administered 2019-08-19 – 2019-08-20 (×4): 10 [IU] via SUBCUTANEOUS

## 2019-08-19 MED ORDER — BISACODYL 5 MG PO TBEC
5.0000 mg | DELAYED_RELEASE_TABLET | Freq: Every day | ORAL | Status: DC | PRN
  Administered 2019-08-19: 5 mg via ORAL
  Filled 2019-08-19: qty 1

## 2019-08-19 MED ORDER — INSULIN GLARGINE 100 UNIT/ML ~~LOC~~ SOLN
20.0000 [IU] | Freq: Two times a day (BID) | SUBCUTANEOUS | Status: DC
  Administered 2019-08-19: 20 [IU] via SUBCUTANEOUS
  Filled 2019-08-19 (×2): qty 0.2

## 2019-08-19 MED ORDER — INSULIN GLARGINE 100 UNIT/ML ~~LOC~~ SOLN
30.0000 [IU] | Freq: Two times a day (BID) | SUBCUTANEOUS | Status: DC
  Administered 2019-08-19 – 2019-08-20 (×2): 30 [IU] via SUBCUTANEOUS
  Filled 2019-08-19 (×3): qty 0.3

## 2019-08-19 MED ORDER — SENNOSIDES-DOCUSATE SODIUM 8.6-50 MG PO TABS
2.0000 | ORAL_TABLET | Freq: Two times a day (BID) | ORAL | Status: DC
  Administered 2019-08-19: 2 via ORAL
  Filled 2019-08-19 (×2): qty 2

## 2019-08-19 MED ORDER — POLYETHYLENE GLYCOL 3350 17 G PO PACK
17.0000 g | PACK | Freq: Every day | ORAL | Status: DC
  Filled 2019-08-19: qty 1

## 2019-08-19 NOTE — Progress Notes (Signed)
Patient self-administered his insulin this evening and demonstrated he is able to do this at home.

## 2019-08-19 NOTE — Care Management (Signed)
Entered into [facility] for dates 2/1 to 2/9. Notified Nicole Kindred in [facility] to follow for meds at DC.

## 2019-08-19 NOTE — Progress Notes (Signed)
Nutrition Note  RD consulted for nutrition education regarding diabetes.   Lab Results  Component Value Date   HGBA1C 11.8 (H) 08/17/2019   RD spoke with pt over the phone. States he has been reading his diabetes management books and materials. States he has been talking with family to come up with a plan for meal planning. States lately he was eating less vegetables and was drinking a lot of juice. Explains now that he needs to increase non-starchy vegetable intake and limit his juice intakes.  RD to provide "Carbohydrate Counting for People with Diabetes" handout from the Academy of Nutrition and Dietetics in discharge instructions. Discussed different food groups and their effects on blood sugar, emphasizing carbohydrate-containing foods. Provided list of carbohydrates and recommended serving sizes of common foods.  Discussed importance of controlled and consistent carbohydrate intake throughout the day. Provided examples of ways to balance meals/snacks and encouraged intake of high-fiber, whole grain complex carbohydrates. Teach back method used.  Expect good compliance. Pt seemed motivated to learn and has gotten some support from family to implement changes.  Body mass index is 39.89 kg/m. Pt meets criteria for obesity based on current BMI.  Current diet order is Heart Healthy/CHO modified. at this time. Labs and medications reviewed. No further nutrition interventions warranted at this time. If additional nutrition issues arise, please re-consult RD.  Riley Bibles, MS, RD, LDN Inpatient Clinical Dietitian Pager: [phone] After Hours Pager: [phone]

## 2019-08-19 NOTE — Discharge Instructions (Signed)

## 2019-08-19 NOTE — Progress Notes (Signed)
PROGRESS NOTE    Riley Hood  PQZ:[id number] DOB: [birth year] DOA: 08/17/2019 PCP: Patient, No Pcp Per    Brief Narrative:  Old gentleman with prior history of hypertension and prediabetes presents with polydipsia and polyuria for 2 weeks.  On arrival to ED he was tested positive for Covid and his CBG was in 900.  Was also found to be in AKI. Assessment & Plan:   Active Problems:   Type 2 diabetes mellitus with other specified complication (HCC)   Hyperosmolar hyperglycemic state (HHS) ([place])   Uncontrolled DM with hyperglycemia:  CBG (last 3)  Recent Labs    08/19/19 0250 08/19/19 0803 08/19/19 1257  GLUCAP 244* 329* 425*   Started the patient on lantus 15 BID  Increased to 30 BID , resistant scale SSI. Increased to  Novolog 10 UNITS  TIDAC.   AKI:  Resolved. Probably sec to dehydration.  Resolved.    Essential hypertension:  Well controlled.    Abnormal EKG; Echocardiogram orderd and reviewed with the patient.     COVID 19 positive.  Pt started having a cough today. Though he is not hypoxic.  No indication for remdisivir or steroids.     DVT prophylaxis: heparin.  Code Status: full code.  Family Communication:none at bedside.  Disposition Plan:   Patient came from: HOME.             Anticipated d/c place:HOME.  Barriers to d/c OR conditions which need to be met to effect a safe d/c: pending improvement in CBG'S   Consultants:   None.   Procedures: echocardiogram.  Antimicrobials: none.   Subjective: Reports worsening cough, no chest pain, sob. No nausea, vomiting or abdominal pain.   Objective: Vitals:   08/18/19 1809 08/18/19 2050 08/19/19 0803 08/19/19 1616  BP: (!) 120/93 126/88 117/65 (!) 147/96  Pulse: (!) 108 94 69 92  Resp: 19 20    Temp: 98.4 F (36.9 C) 98.2 F (36.8 C) 97.8 F (36.6 C) 98 F (36.7 C)  TempSrc:  Oral Oral Oral  SpO2: 95% 97% 97% 98%  Weight:      Height:        Intake/Output Summary (Last 24 hours) at  08/19/2019 1652 Last data filed at 08/19/2019 1400 Gross per 24 hour  Intake --  Output 1700 ml  Net -1700 ml   Filed Weights   08/18/19 1700  Weight: 133.4 kg    Examination:  General exam:calm and comfortable.  Respiratory system: Clear to auscultation bilaterally, no wheezing or rhonchi Cardiovascular system: S 1 S2 heard, regular rate rhythm, no JVD, no pedal edema  gastrointestinal system: Abdomen is soft, nontender, nondistended, bowel sounds normal Central nervous system: Alert and oriented, grossly nonfocal. Extremities: No pedal edema, cyanosis or clubbing Skin: No rashes seen Psychiatry: .  Mood is appropriate  Data Reviewed: I have personally reviewed following labs and imaging studies  CBC: Recent Labs  Lab 08/17/19 0952  WBC 8.4  NEUTROABS 5.5  HGB 17.1*  HCT 50.7  MCV 75.0*  PLT 335   Basic Metabolic Panel: Recent Labs  Lab 08/17/19 2107 08/18/19 0452 08/18/19 1644 08/19/19 0354 08/19/19 1349  NA 137 137 131* 137 131*  K 4.2 4.3 4.9 4.4 4.8  CL 99 101 97* 99 99  CO2 '27 25 24 27 23  '$ GLUCOSE 223* 273* 442* 262* 424*  BUN 22* 22* 23* 18 16  CREATININE 1.15 1.02 1.25* 0.99 1.06  CALCIUM 10.8* 9.8   9.8 9.8 9.7 9.1  GFR: Estimated Creatinine Clearance: 112.6 mL/min (by C-G formula based on SCr of 1.06 mg/dL). Liver Function Tests: No results for input(s): AST, ALT, ALKPHOS, BILITOT, PROT, ALBUMIN in the last 168 hours. No results for input(s): LIPASE, AMYLASE in the last 168 hours. No results for input(s): AMMONIA in the last 168 hours. Coagulation Profile: No results for input(s): INR, PROTIME in the last 168 hours. Cardiac Enzymes: No results for input(s): CKTOTAL, CKMB, CKMBINDEX, TROPONINI in the last 168 hours. BNP (last 3 results) No results for input(s): PROBNP in the last 8760 hours. HbA1C: Recent Labs    08/17/19 1320  HGBA1C 11.8*   CBG: Recent Labs  Lab 08/18/19 2045 08/18/19 2320 08/19/19 0250 08/19/19 0803 08/19/19 1257   GLUCAP 410* 428* 244* 329* 425*   Lipid Profile: Recent Labs    08/17/19 1320  CHOL 356*  HDL 37*  LDLCALC UNABLE TO CALCULATE IF TRIGLYCERIDE OVER 400 mg/dL  TRIG 620*  CHOLHDL 9.6  LDLDIRECT 162.9*   Thyroid Function Tests: No results for input(s): TSH, T4TOTAL, FREET4, T3FREE, THYROIDAB in the last 72 hours. Anemia Panel: No results for input(s): VITAMINB12, FOLATE, FERRITIN, TIBC, IRON, RETICCTPCT in the last 72 hours. Sepsis Labs: No results for input(s): PROCALCITON, LATICACIDVEN in the last 168 hours.  Recent Results (from the past 240 hour(s))  SARS CORONAVIRUS 2 (TAT 6-24 HRS) Nasopharyngeal Nasopharyngeal Swab     Status: Abnormal   Collection Time: 08/17/19 11:54 AM   Specimen: Nasopharyngeal Swab  Result Value Ref Range Status   SARS Coronavirus 2 POSITIVE (A) NEGATIVE Final    Comment: RESULT CALLED TO, READ BACK BY AND VERIFIED WITH: Alphia Moh RN.'@1900'$  ON 1.30.21 BY TCALDWELL MT. (NOTE) SARS-CoV-2 target nucleic acids are DETECTED. The SARS-CoV-2 RNA is generally detectable in upper and lower respiratory specimens during the acute phase of infection. Positive results are indicative of the presence of SARS-CoV-2 RNA. Clinical correlation with patient history and other diagnostic information is  necessary to determine patient infection status. Positive results do not rule out bacterial infection or co-infection with other viruses.  The expected result is Negative. Fact Sheet for Patients: SugarRoll.be Fact Sheet for Healthcare Providers: https://www.woods-mathews.com/ This test is not yet approved or cleared by the [place] FDA and  has been authorized for detection and/or diagnosis of SARS-CoV-2 by FDA under an Emergency Use Authorization (EUA). This EUA will remain  in effect (meaning this test can be u sed) for the duration of the COVID-19 declaration under Section 564(b)(1) of the Act, 21 U.S.C. section  360bbb-3(b)(1), unless the authorization is terminated or revoked sooner. Performed at [facility], [place] [address], [place], [place] [address]          Radiology Studies: ECHOCARDIOGRAM LIMITED  Result Date: 08/18/2019   ECHOCARDIOGRAM LIMITED REPORT   Patient Name:   Riley Hood Date of Exam: 08/18/2019 Medical Rec #:  [id number]         Height:       72.0 in Accession #:    [id number]        Weight:       280.0 lb Date of Birth:  Jan 21, 1966          BSA:          2.46 m Patient Age:    54 years          BP:           121/91 mmHg Patient Gender: M  HR:           97 bpm. Exam Location:  Inpatient  Procedure: Limited Echo Indications:    abnormal ECG 794.31  History:        Patient has prior history of Echocardiogram examinations, most                 recent 05/26/2015.  Sonographer:    Jannett Celestine RDCS (AE) Referring Phys: [place]  1. Left ventricular ejection fraction, by visual estimation, is 70 to 75%. The left ventricle has hyperdynamic function.  2. The left ventricle has no regional wall motion abnormalities.  3. Global right ventricle has normal systolc function.The right ventricular size is normal. no increase in right ventricular wall thickness.  4. The mitral valve is normal in structure. No evidence of mitral valve regurgitation. No evidence of mitral stenosis.  5. Tricuspid valve regurgitation is not demonstrated.  6. Indeterminant aortic valve leaflets, possibly bicuspid. Doppler evaluation not obtained. Grossly morphologically at least mild aortic stenosis. FINDINGS  Left Ventricle: Left ventricular ejection fraction, by visual estimation, is 70 to 75%. The left ventricle has hyperdynamic function. The left ventricle has no regional wall motion abnormalities. Right Ventricle: The right ventricular size is normal. No increase in right ventricular wall thickness. Global RV systolic function is has normal systolic function. Right Atrium: Right  atrial size was normal in size. Right atrial pressure is estimated at 10 mmHg. Pericardium: There is no evidence of pericardial effusion is seen. There is no evidence of pericardial effusion. Mitral Valve: The mitral valve is normal in structure. No evidence of mitral valve stenosis by observation. No evidence of mitral valve regurgitation. Tricuspid Valve: The tricuspid valve is not well visualized. Tricuspid valve regurgitation is not demonstrated. Aortic Valve: There is mild thickening of the aortic valve. There is mild calcification of the aortic valve. Mild aortic valve annular calcification. Indeterminant aortic valve leaflets, possibly bicuspid. Doppler evaluation not obtained. Grossly morphologically at least mild aortic stenosis. Aorta: The aortic root is normal in size and structure. Venous: The inferior vena cava was not well visualized.  Carlyle Dolly MD Electronically signed by Carlyle Dolly MD Signature Date/Time: 08/18/2019/4:53:11 PMThe mitral valve is normal in structure.    Final         Scheduled Meds:  atorvastatin  40 mg Oral q1800   heparin  5,000 Units Subcutaneous Q8H   insulin aspart  0-20 Units Subcutaneous TID WC   insulin aspart  0-5 Units Subcutaneous QHS   insulin aspart  10 Units Subcutaneous TID WC   insulin glargine  30 Units Subcutaneous BID   Continuous Infusions:  sodium chloride 75 mL/hr at 08/19/19 0352     LOS: 2 days       Hosie Poisson, MD [facility]   To contact the attending provider between 7A-7P or the covering provider during after hours 7P-7A, please log into the web site [email or web address] and access using universal [place] password for that web site. If you do not have the password, please call the hospital operator.  08/19/2019, 4:52 PM

## 2019-08-20 ENCOUNTER — Other Ambulatory Visit (HOSPITAL_COMMUNITY): Payer: Self-pay | Admitting: Internal Medicine

## 2019-08-20 DIAGNOSIS — E1310 Other specified diabetes mellitus with ketoacidosis without coma: Secondary | ICD-10-CM

## 2019-08-20 LAB — CBC
HCT: 40.3 % (ref 39.0–52.0)
Hemoglobin: 13.6 g/dL (ref 13.0–17.0)
MCH: 25.3 pg — ABNORMAL LOW (ref 26.0–34.0)
MCHC: 33.7 g/dL (ref 30.0–36.0)
MCV: 74.9 fL — ABNORMAL LOW (ref 80.0–100.0)
Platelets: 154 K/uL (ref 150–400)
RBC: 5.38 MIL/uL (ref 4.22–5.81)
RDW: 14.9 % (ref 11.5–15.5)
WBC: 5 K/uL (ref 4.0–10.5)
nRBC: 0 % (ref 0.0–0.2)

## 2019-08-20 LAB — BASIC METABOLIC PANEL
Anion gap: 8 (ref 5–15)
BUN: 14 mg/dL (ref 6–20)
CO2: 24 mmol/L (ref 22–32)
Calcium: 8.8 mg/dL — ABNORMAL LOW (ref 8.9–10.3)
Chloride: 102 mmol/L (ref 98–111)
Creatinine, Ser: 0.84 mg/dL (ref 0.61–1.24)
GFR calc Af Amer: >60 mL/min (ref >60)
GFR calc non Af Amer: >60 mL/min (ref >60)
Glucose, Bld: 234 mg/dL — ABNORMAL HIGH (ref 70–99)
Potassium: 3.8 mmol/L (ref 3.5–5.1)
Sodium: 134 mmol/L — ABNORMAL LOW (ref 135–145)

## 2019-08-20 LAB — GLUCOSE, CAPILLARY
Glucose-Capillary: 224 mg/dL — ABNORMAL HIGH (ref 70–99)
Glucose-Capillary: 248 mg/dL — ABNORMAL HIGH (ref 70–99)

## 2019-08-20 MED ORDER — INSULIN ASPART 100 UNIT/ML ~~LOC~~ SOLN: 5.0000 [IU] | Freq: Three times a day (TID) | SUBCUTANEOUS | 11 refills | Status: DC

## 2019-08-20 MED ORDER — METOPROLOL TARTRATE 50 MG PO TABS: 50.0000 mg | ORAL_TABLET | Freq: Two times a day (BID) | ORAL | 2 refills | Status: DC

## 2019-08-20 MED ORDER — SENNOSIDES-DOCUSATE SODIUM 8.6-50 MG PO TABS: 2.0000 | ORAL_TABLET | Freq: Two times a day (BID) | ORAL | Status: DC

## 2019-08-20 MED ORDER — BLOOD GLUCOSE MONITOR KIT: PACK | 0 refills | Status: DC

## 2019-08-20 MED ORDER — INSULIN GLARGINE 100 UNIT/ML ~~LOC~~ SOLN: 34.0000 [IU] | Freq: Two times a day (BID) | SUBCUTANEOUS | 11 refills | Status: DC

## 2019-08-20 MED ORDER — INSULIN GLARGINE 100 UNIT/ML ~~LOC~~ SOLN: 30.0000 [IU] | Freq: Two times a day (BID) | SUBCUTANEOUS | 11 refills | Status: DC

## 2019-08-20 MED ORDER — INSULIN ASPART 100 UNIT/ML ~~LOC~~ SOLN: SUBCUTANEOUS | 11 refills | Status: DC

## 2019-08-20 MED ORDER — ASPIRIN 81 MG PO TBEC: 81.0000 mg | DELAYED_RELEASE_TABLET | Freq: Every day | ORAL | 12 refills | Status: DC

## 2019-08-20 MED ORDER — POLYETHYLENE GLYCOL 3350 17 G PO PACK: 17.0000 g | PACK | Freq: Every day | ORAL | 0 refills | Status: DC

## 2019-08-20 MED ORDER — ATORVASTATIN CALCIUM 40 MG PO TABS: 40.0000 mg | ORAL_TABLET | Freq: Every day | ORAL | 3 refills | Status: DC

## 2019-08-20 MED FILL — METOPROLOL TARTRATE 50 MG T: 50 | 30 days supply | Qty: 60 | Fill #0

## 2019-08-20 MED FILL — NovoLOG 100 UNIT/ML SOLN: 100 | 10 days supply | Qty: 10 | Fill #0

## 2019-08-20 MED FILL — TRUE METRIX GLUCOSE TEST ST: 25 days supply | Qty: 100 | Fill #0

## 2019-08-20 MED FILL — POLYETHYLENE GLYCOL 3350 PO: 17 | 14 days supply | Qty: 238 | Fill #0

## 2019-08-20 MED FILL — TRUE METRIX BLOOD GLUCOSE M: W/DEVICE | 1 days supply | Qty: 1 | Fill #0

## 2019-08-20 MED FILL — ATORVASTATIN CALCIUM 40 MG: 40 | 30 days supply | Qty: 30 | Fill #0

## 2019-08-20 MED FILL — TRUEplus LANCETS 28G MISC: 25 days supply | Qty: 100 | Fill #0

## 2019-08-20 MED FILL — LANTUS 100 UNITS/ML VIAL: 100 | 14 days supply | Qty: 10 | Fill #0

## 2019-08-20 NOTE — Progress Notes (Addendum)
Inpatient Diabetes Program Recommendations  AACE/ADA: New Consensus Statement on Inpatient Glycemic Control (2015)  Target Ranges:  Prepandial:   less than 140 mg/dL      Peak postprandial:   less than 180 mg/dL (1-2 hours)      Critically ill patients:  140 - 180 mg/dL   Lab Results  Component Value Date   GLUCAP 224 (H) 08/20/2019   HGBA1C 11.8 (H) 08/17/2019    Review of Glycemic Control Results for Riley Hood, Riley Hood (MRN [id number]) as of 08/20/2019 12:32  Ref. Range 08/19/2019 12:57 08/19/2019 18:37 08/19/2019 20:51 08/20/2019 08:59  Glucose-Capillary Latest Ref Range: 70 - 99 mg/dL 425 (H) 192 (H) 214 (H) 224 (H)  Diabetes history:PreDM Outpatient Diabetes medications:None Current orders for Inpatient glycemic control: Novolog 0-15 units TID with meals, Novolog 0-5 units QHS, Novolog 10 units TID with meals, Lantus 30 units bid Inpatient Diabetes Program Recommendations:    Per RN, patient is doing well with insulin administration.  Due to lack of insurance, he will likely need more affordable insulin regimen at d/c.  Consider Novolin 70/30 25 units bid at discharge which can be purchased at [facility] for 25$ a vial.  Patient will need f/u at a clinic or PCP for DM. TOC referral placed.  Thanks  Adah Perl, RN, BC-ADM Inpatient Diabetes Coordinator Pager [phone] (8a-5p)

## 2019-08-20 NOTE — Care Management (Addendum)
Pt deemed stable for discharge home today.  CM clarified with attending that pt does not require HHRN simply a PCP appt.  Pt was successfully MATCHed and pt confirms he has received his discharge medications from [place].  Pt informed of follow up televisit with [facility] - see AVS.  Pt confirms his phone number is accurate in Epic. CM requested pt to request medication assistance at follow up appt to ensure he can continue to get his medications.  No other CM needs determined - discharge order written - CM signing off

## 2019-08-28 NOTE — Discharge Summary (Signed)
Physician Discharge Summary  Riley Hood FVC:[id number] DOB: Nov 10, 1965 DOA: 08/17/2019  PCP: Patient, No Pcp Per  Admit date: 08/17/2019 Discharge date: 08/20/2019  Admitted From:Home.  Disposition:  Home.   Recommendations for Outpatient Follow-up:  1. Follow up with PCP in 1-2 weeks 2. Please obtain BMP/CBC in one week  Discharge Condition:stable.  CODE STATUS: full code.  Diet recommendation: Heart Healthy   Brief/Interim Summary:  54 year Old gentleman with prior history of hypertension and prediabetes presents with polydipsia and polyuria for 2 weeks.  On arrival to ED he was tested positive for Covid and his CBG was in 900.  Was also found to be in AKI. Discharge Diagnoses:  Active Problems:   Type 2 diabetes mellitus with other specified complication (HCC)   Hyperosmolar hyperglycemic state (HHS) ([place])  Uncontrolled DM with hyperglycemia:  Recommend outpatient follow up with PCP in one week.  Resume Lantus 34 units, and SSI on discharge.   AKI:  Resolved. Probably sec to dehydration.  Resolved.    Essential hypertension:  Well controlled.    Abnormal EKG; Echocardiogram orderd and reviewed with the patient.     COVID 19 positive.  Pt started having a cough today. Though he is not hypoxic.  No indication for remdisivir or steroids.       Discharge Instructions  Discharge Instructions    Diet - low sodium heart healthy   Complete by: As directed    Discharge instructions   Complete by: As directed    Please follow up with PCP in one week.     Allergies as of 08/20/2019   No Known Allergies     Medication List    STOP taking these medications   amLODipine 10 MG tablet Commonly known as: NORVASC   amoxicillin 875 MG tablet Commonly known as: AMOXIL   chlorthalidone 25 MG tablet Commonly known as: HYGROTON   HYDROcodone-acetaminophen 5-325 MG tablet Commonly known as: Norco   ondansetron 4 MG tablet Commonly known as:  Zofran   VITA-C PO     TAKE these medications   aspirin 81 MG EC tablet Take 1 tablet (81 mg total) by mouth daily.   atorvastatin 40 MG tablet Commonly known as: LIPITOR Take 1 tablet (40 mg total) by mouth daily at 6 PM. What changed:   medication strength  how much to take   blood glucose meter kit and supplies Kit Dispense based on patient and insurance preference. Use up to four times daily as directed. (FOR ICD-9 250.00, 250.01).   insulin aspart 100 UNIT/ML injection Commonly known as: novoLOG Inject 5 Units into the skin 3 (three) times daily with meals.   insulin aspart 100 UNIT/ML injection Commonly known as: novoLOG CBG 70 - 120: 0 units CBG 121 - 150: 3 units CBG 151 - 200: 4 units CBG 201 - 250: 7 units CBG 251 - 300: 11 units CBG 301 - 350: 15 units CBG 351 - 400: 20 units   insulin glargine 100 UNIT/ML injection Commonly known as: LANTUS Inject 0.34 mLs (34 Units total) into the skin 2 (two) times daily.   metoprolol tartrate 50 MG tablet Commonly known as: LOPRESSOR Take 1 tablet (50 mg total) by mouth 2 (two) times daily.   polyethylene glycol 17 g packet Commonly known as: MIRALAX / GLYCOLAX Take 17 g by mouth daily.   senna-docusate 8.6-50 MG tablet Commonly known as: Senokot-S Take 2 tablets by mouth 2 (two) times daily.   vitamin B-12 250 MCG  tablet Commonly known as: CYANOCOBALAMIN Take 250 mcg by mouth daily.   VITAMIN D PO Take 1 tablet by mouth daily.   VITAMIN E PO Take 1 capsule by mouth daily.       No Known Allergies  Consultations:  None.    Procedures/Studies: ECHOCARDIOGRAM LIMITED  Result Date: 08/18/2019   ECHOCARDIOGRAM LIMITED REPORT   Patient Name:   Riley Hood Date of Exam: 08/18/2019 Medical Rec #:  [id number]         Height:       72.0 in Accession #:    [id number]        Weight:       280.0 lb Date of Birth:  10/19/1965          BSA:          2.46 m Patient Age:    53 years          BP:            121/91 mmHg Patient Gender: M                 HR:           97 bpm. Exam Location:  Inpatient  Procedure: Limited Echo Indications:    abnormal ECG 794.31  History:        Patient has prior history of Echocardiogram examinations, most                 recent 05/26/2015.  Sonographer:    Jannett Celestine RDCS (AE) Referring Phys: [place]  1. Left ventricular ejection fraction, by visual estimation, is 70 to 75%. The left ventricle has hyperdynamic function.  2. The left ventricle has no regional wall motion abnormalities.  3. Global right ventricle has normal systolc function.The right ventricular size is normal. no increase in right ventricular wall thickness.  4. The mitral valve is normal in structure. No evidence of mitral valve regurgitation. No evidence of mitral stenosis.  5. Tricuspid valve regurgitation is not demonstrated.  6. Indeterminant aortic valve leaflets, possibly bicuspid. Doppler evaluation not obtained. Grossly morphologically at least mild aortic stenosis. FINDINGS  Left Ventricle: Left ventricular ejection fraction, by visual estimation, is 70 to 75%. The left ventricle has hyperdynamic function. The left ventricle has no regional wall motion abnormalities. Right Ventricle: The right ventricular size is normal. No increase in right ventricular wall thickness. Global RV systolic function is has normal systolic function. Right Atrium: Right atrial size was normal in size. Right atrial pressure is estimated at 10 mmHg. Pericardium: There is no evidence of pericardial effusion is seen. There is no evidence of pericardial effusion. Mitral Valve: The mitral valve is normal in structure. No evidence of mitral valve stenosis by observation. No evidence of mitral valve regurgitation. Tricuspid Valve: The tricuspid valve is not well visualized. Tricuspid valve regurgitation is not demonstrated. Aortic Valve: There is mild thickening of the aortic valve. There is mild calcification of the  aortic valve. Mild aortic valve annular calcification. Indeterminant aortic valve leaflets, possibly bicuspid. Doppler evaluation not obtained. Grossly morphologically at least mild aortic stenosis. Aorta: The aortic root is normal in size and structure. Venous: The inferior vena cava was not well visualized.  Carlyle Dolly MD Electronically signed by Carlyle Dolly MD Signature Date/Time: 08/18/2019/4:53:11 PMThe mitral valve is normal in structure.    Final        Subjective:  No complaints.  Discharge Exam: Vitals:   08/19/19 2051 08/20/19 0900  BP: 135/89 (!) 124/97  Pulse: 94 96  Resp: 20 20  Temp: 98 F (36.7 C) 98.1 F (36.7 C)  SpO2: 98% 96%   Vitals:   08/19/19 0803 08/19/19 1616 08/19/19 2051 08/20/19 0900  BP: 117/65 (!) 147/96 135/89 (!) 124/97  Pulse: 69 92 94 96  Resp:   20 20  Temp: 97.8 F (36.6 C) 98 F (36.7 C) 98 F (36.7 C) 98.1 F (36.7 C)  TempSrc: Oral Oral Oral Oral  SpO2: 97% 98% 98% 96%  Weight:      Height:        General: Pt is alert, awake, not in acute distress Cardiovascular: RRR, S1/S2 +, no rubs, no gallops Respiratory: CTA bilaterally, no wheezing, no rhonchi Abdominal: Soft, NT, ND, bowel sounds + Extremities: no edema, no cyanosis    The results of significant diagnostics from this hospitalization (including imaging, microbiology, ancillary and laboratory) are listed below for reference.     Microbiology: No results found for this or any previous visit (from the past 240 hour(s)).   Labs: BNP (last 3 results) No results for input(s): BNP in the last 8760 hours. Basic Metabolic Panel: No results for input(s): NA, K, CL, CO2, GLUCOSE, BUN, CREATININE, CALCIUM, MG, PHOS in the last 168 hours. Liver Function Tests: No results for input(s): AST, ALT, ALKPHOS, BILITOT, PROT, ALBUMIN in the last 168 hours. No results for input(s): LIPASE, AMYLASE in the last 168 hours. No results for input(s): AMMONIA in the last 168  hours. CBC: No results for input(s): WBC, NEUTROABS, HGB, HCT, MCV, PLT in the last 168 hours. Cardiac Enzymes: No results for input(s): CKTOTAL, CKMB, CKMBINDEX, TROPONINI in the last 168 hours. BNP: Invalid input(s): POCBNP CBG: No results for input(s): GLUCAP in the last 168 hours. D-Dimer No results for input(s): DDIMER in the last 72 hours. Hgb A1c No results for input(s): HGBA1C in the last 72 hours. Lipid Profile No results for input(s): CHOL, HDL, LDLCALC, TRIG, CHOLHDL, LDLDIRECT in the last 72 hours. Thyroid function studies No results for input(s): TSH, T4TOTAL, T3FREE, THYROIDAB in the last 72 hours.  Invalid input(s): FREET3 Anemia work up No results for input(s): VITAMINB12, FOLATE, FERRITIN, TIBC, IRON, RETICCTPCT in the last 72 hours. Urinalysis    Component Value Date/Time   COLORURINE STRAW (A) 08/17/2019 0906   APPEARANCEUR CLEAR 08/17/2019 0906   LABSPEC 1.027 08/17/2019 0906   PHURINE 6.0 08/17/2019 0906   GLUCOSEU >=500 (A) 08/17/2019 0906   HGBUR NEGATIVE 08/17/2019 0906   BILIRUBINUR NEGATIVE 08/17/2019 0906   KETONESUR 20 (A) 08/17/2019 0906   PROTEINUR NEGATIVE 08/17/2019 0906   UROBILINOGEN 1.0 04/24/2009 0137   NITRITE NEGATIVE 08/17/2019 0906   LEUKOCYTESUR NEGATIVE 08/17/2019 0906   Sepsis Labs Invalid input(s): PROCALCITONIN,  WBC,  LACTICIDVEN Microbiology No results found for this or any previous visit (from the past 240 hour(s)).   Time coordinating discharge: 32 min  SIGNED:   Hosie Poisson, MD  [facility] 08/28/2019, 9:31 AM

## 2019-09-09 ENCOUNTER — Other Ambulatory Visit: Payer: Self-pay

## 2019-09-09 ENCOUNTER — Telehealth: Payer: Self-pay

## 2019-09-09 ENCOUNTER — Ambulatory Visit (INDEPENDENT_AMBULATORY_CARE_PROVIDER_SITE_OTHER): Payer: Self-pay | Admitting: Nurse Practitioner

## 2019-09-09 DIAGNOSIS — I10 Essential (primary) hypertension: Secondary | ICD-10-CM

## 2019-09-09 DIAGNOSIS — E1169 Type 2 diabetes mellitus with other specified complication: Secondary | ICD-10-CM

## 2019-09-09 NOTE — Telephone Encounter (Signed)
Called, no answer. Left a message that we are calling to do his tele visit today and for him to please call back asap. Thanks!

## 2019-09-09 NOTE — Patient Instructions (Signed)
Diabetes Mellitus and Exercise Exercising regularly is important for your overall health, especially when you have diabetes (diabetes mellitus). Exercising is not only about losing weight. It has many other health benefits, such as increasing muscle strength and bone density and reducing body fat and stress. This leads to improved fitness, flexibility, and endurance, all of which result in better overall health. Exercise has additional benefits for people with diabetes, including:  Reducing appetite.  Helping to lower and control blood glucose.  Lowering blood pressure.  Helping to control amounts of fatty substances (lipids) in the blood, such as cholesterol and triglycerides.  Helping the body to respond better to insulin (improving insulin sensitivity).  Reducing how much insulin the body needs.  Decreasing the risk for heart disease by: ? Lowering cholesterol and triglyceride levels. ? Increasing the levels of good cholesterol. ? Lowering blood glucose levels. What is my activity plan? Your health care provider or certified diabetes educator can help you make a plan for the type and frequency of exercise (activity plan) that works for you. Make sure that you:  Do at least 150 minutes of moderate-intensity or vigorous-intensity exercise each week. This could be brisk walking, biking, or water aerobics. ? Do stretching and strength exercises, such as yoga or weightlifting, at least 2 times a week. ? Spread out your activity over at least 3 days of the week.  Get some form of physical activity every day. ? Do not go more than 2 days in a row without some kind of physical activity. ? Avoid being inactive for more than 30 minutes at a time. Take frequent breaks to walk or stretch.  Choose a type of exercise or activity that you enjoy, and set realistic goals.  Start slowly, and gradually increase the intensity of your exercise over time. What do I need to know about managing my  diabetes?   Check your blood glucose before and after exercising. ? If your blood glucose is 240 mg/dL (13.3 mmol/L) or higher before you exercise, check your urine for ketones. If you have ketones in your urine, do not exercise until your blood glucose returns to normal. ? If your blood glucose is 100 mg/dL (5.6 mmol/L) or lower, eat a snack containing 15-20 grams of carbohydrate. Check your blood glucose 15 minutes after the snack to make sure that your level is above 100 mg/dL (5.6 mmol/L) before you start your exercise.  Know the symptoms of low blood glucose (hypoglycemia) and how to treat it. Your risk for hypoglycemia increases during and after exercise. Common symptoms of hypoglycemia can include: ? Hunger. ? Anxiety. ? Sweating and feeling clammy. ? Confusion. ? Dizziness or feeling light-headed. ? Increased heart rate or palpitations. ? Blurry vision. ? Tingling or numbness around the mouth, lips, or tongue. ? Tremors or shakes. ? Irritability.  Keep a rapid-acting carbohydrate snack available before, during, and after exercise to help prevent or treat hypoglycemia.  Avoid injecting insulin into areas of the body that are going to be exercised. For example, avoid injecting insulin into: ? The arms, when playing tennis. ? The legs, when jogging.  Keep records of your exercise habits. Doing this can help you and your health care provider adjust your diabetes management plan as needed. Write down: ? Food that you eat before and after you exercise. ? Blood glucose levels before and after you exercise. ? The type and amount of exercise you have done. ? When your insulin is expected to peak, if you use   insulin. Avoid exercising at times when your insulin is peaking.  When you start a new exercise or activity, work with your health care provider to make sure the activity is safe for you, and to adjust your insulin, medicines, or food intake as needed.  Drink plenty of water while  you exercise to prevent dehydration or heat stroke. Drink enough fluid to keep your urine clear or pale yellow. Summary  Exercising regularly is important for your overall health, especially when you have diabetes (diabetes mellitus).  Exercising has many health benefits, such as increasing muscle strength and bone density and reducing body fat and stress.  Your health care provider or certified diabetes educator can help you make a plan for the type and frequency of exercise (activity plan) that works for you.  When you start a new exercise or activity, work with your health care provider to make sure the activity is safe for you, and to adjust your insulin, medicines, or food intake as needed. This information is not intended to replace advice given to you by your health care provider. Make sure you discuss any questions you have with your health care provider. Document Revised: 01/26/2017 Document Reviewed: 12/14/2015 Elsevier Patient Education  2020 Elsevier Inc.  

## 2019-09-09 NOTE — Addendum Note (Signed)
Addended by: Vevelyn Francois on: 09/09/2019 03:35 PM   Modules accepted: Level of Service

## 2019-09-09 NOTE — Progress Notes (Signed)
Virtual Visit via Telephone Note  I connected with Riley Hood on 09/09/19 at  10:08 AM EST by telephone and verified that I am speaking with the correct person using two identifiers.   I discussed the limitations, risks, security and privacy concerns of performing an evaluation and management service by telephone and the availability of in person appointments. I also discussed with the patient that there may be a patient responsible charge related to this service. The patient expressed understanding and agreed to proceed.   History of Present Illness: He is establishing care.  He has a past medical history of Hypertension.  He was recently seen in the emergency room for COVID-19.  At that time his CBG was 900.  He was started on Lantus 34 units twice daily along with sliding scale NovoLog.  This morning he admits that he feels a lot better.  His fasting blood glucose was 135 mg/dL.  He admits that he has changed his diet and is trying to do whatever it takes for him to feel better.  He denies polydipsia or polyuria.  He is also been monitoring his blood pressure and admits that it has been in normal range. Denies headache, dizziness, visual changes, shortness of breath, dyspnea on exertion, chest pain, nausea, vomiting or any edema.   Observations/Objective: Able to speak in full sentences without any shortness of breath  Assessment and Plan: Assessment  Primary Diagnosis & Pertinent Problem List: The primary encounter diagnosis was Type 2 diabetes mellitus with other specified complication, unspecified whether long term insulin use ([place]). A diagnosis of Essential hypertension was also pertinent to this visit.  Visit Diagnosis: 1. Type 2 diabetes mellitus with other specified complication, unspecified whether long term insulin use ([place])   2. Essential hypertension     Follow Up Instructions: Provider-requested follow-up: Return in about 4 weeks (around 10/07/2019).  Future  Appointments  Date Time Provider [place]  10/07/2019  9:20 AM Vevelyn Francois, NP [place] None   Patient instructions provided during this appointment: Patient Instructions  Diabetes Mellitus and Exercise Exercising regularly is important for your overall health, especially when you have diabetes (diabetes mellitus). Exercising is not only about losing weight. It has many other health benefits, such as increasing muscle strength and bone density and reducing body fat and stress. This leads to improved fitness, flexibility, and endurance, all of which result in better overall health. Exercise has additional benefits for people with diabetes, including:  Reducing appetite.  Helping to lower and control blood glucose.  Lowering blood pressure.  Helping to control amounts of fatty substances (lipids) in the blood, such as cholesterol and triglycerides.  Helping the body to respond better to insulin (improving insulin sensitivity).  Reducing how much insulin the body needs.  Decreasing the risk for heart disease by: ? Lowering cholesterol and triglyceride levels. ? Increasing the levels of good cholesterol. ? Lowering blood glucose levels. What is my activity plan? Your health care provider or certified diabetes educator can help you make a plan for the type and frequency of exercise (activity plan) that works for you. Make sure that you:  Do at least 150 minutes of moderate-intensity or vigorous-intensity exercise each week. This could be brisk walking, biking, or water aerobics. ? Do stretching and strength exercises, such as yoga or weightlifting, at least 2 times a week. ? Spread out your activity over at least 3 days of the week.  Get some form of physical activity every day. ? Do  not go more than 2 days in a row without some kind of physical activity. ? Avoid being inactive for more than 30 minutes at a time. Take frequent breaks to walk or stretch.  Choose a type of  exercise or activity that you enjoy, and set realistic goals.  Start slowly, and gradually increase the intensity of your exercise over time. What do I need to know about managing my diabetes?   Check your blood glucose before and after exercising. ? If your blood glucose is 240 mg/dL (13.3 mmol/L) or higher before you exercise, check your urine for ketones. If you have ketones in your urine, do not exercise until your blood glucose returns to normal. ? If your blood glucose is 100 mg/dL (5.6 mmol/L) or lower, eat a snack containing 15-20 grams of carbohydrate. Check your blood glucose 15 minutes after the snack to make sure that your level is above 100 mg/dL (5.6 mmol/L) before you start your exercise.  Know the symptoms of low blood glucose (hypoglycemia) and how to treat it. Your risk for hypoglycemia increases during and after exercise. Common symptoms of hypoglycemia can include: ? Hunger. ? Anxiety. ? Sweating and feeling clammy. ? Confusion. ? Dizziness or feeling light-headed. ? Increased heart rate or palpitations. ? Blurry vision. ? Tingling or numbness around the mouth, lips, or tongue. ? Tremors or shakes. ? Irritability.  Keep a rapid-acting carbohydrate snack available before, during, and after exercise to help prevent or treat hypoglycemia.  Avoid injecting insulin into areas of the body that are going to be exercised. For example, avoid injecting insulin into: ? The arms, when playing tennis. ? The legs, when jogging.  Keep records of your exercise habits. Doing this can help you and your health care provider adjust your diabetes management plan as needed. Write down: ? Food that you eat before and after you exercise. ? Blood glucose levels before and after you exercise. ? The type and amount of exercise you have done. ? When your insulin is expected to peak, if you use insulin. Avoid exercising at times when your insulin is peaking.  When you start a new exercise or  activity, work with your health care provider to make sure the activity is safe for you, and to adjust your insulin, medicines, or food intake as needed.  Drink plenty of water while you exercise to prevent dehydration or heat stroke. Drink enough fluid to keep your urine clear or pale yellow. Summary  Exercising regularly is important for your overall health, especially when you have diabetes (diabetes mellitus).  Exercising has many health benefits, such as increasing muscle strength and bone density and reducing body fat and stress.  Your health care provider or certified diabetes educator can help you make a plan for the type and frequency of exercise (activity plan) that works for you.  When you start a new exercise or activity, work with your health care provider to make sure the activity is safe for you, and to adjust your insulin, medicines, or food intake as needed. This information is not intended to replace advice given to you by your health care provider. Make sure you discuss any questions you have with your health care provider. Document Revised: 01/26/2017 Document Reviewed: 12/14/2015 Elsevier Patient Education  [place].    I discussed the assessment and treatment plan with the patient. The patient was provided an opportunity to ask questions and all were answered. The patient agreed with the plan and demonstrated an  understanding of the instructions.   The patient was advised to call back or seek an in-person evaluation if the symptoms worsen or if the condition fails to improve as anticipated.  I provided  9 minutes of non-face-to-face time during this encounter.   Vevelyn Francois, NP

## 2019-10-02 MED FILL — ?METOPROLOL TART 50 MG TABL: 50 | 30 days supply | Qty: 60 | Fill #0

## 2019-10-02 MED FILL — !LANTUS 100 UNITS/ML VIAL: 100 | 28 days supply | Qty: 20 | Fill #0

## 2019-10-02 MED FILL — ?ATORVASTATIN 40MG TABLET: 40 | 30 days supply | Qty: 30 | Fill #0

## 2019-10-07 ENCOUNTER — Ambulatory Visit (INDEPENDENT_AMBULATORY_CARE_PROVIDER_SITE_OTHER): Payer: Self-pay | Admitting: Nurse Practitioner

## 2019-10-07 ENCOUNTER — Encounter: Payer: Self-pay | Admitting: Nurse Practitioner

## 2019-10-07 ENCOUNTER — Other Ambulatory Visit: Payer: Self-pay

## 2019-10-07 VITALS — BP 114/80 | HR 67 | Temp 98.0°F | Resp 16 | Ht 72.0 in | Wt 299.0 lb

## 2019-10-07 DIAGNOSIS — E1169 Type 2 diabetes mellitus with other specified complication: Secondary | ICD-10-CM

## 2019-10-07 DIAGNOSIS — Z23 Encounter for immunization: Secondary | ICD-10-CM

## 2019-10-07 DIAGNOSIS — J45909 Unspecified asthma, uncomplicated: Secondary | ICD-10-CM

## 2019-10-07 DIAGNOSIS — I10 Essential (primary) hypertension: Secondary | ICD-10-CM

## 2019-10-07 LAB — POCT URINALYSIS DIPSTICK
Bilirubin, UA: NEGATIVE
Blood, UA: NEGATIVE
Glucose, UA: POSITIVE — AB
Ketones, UA: NEGATIVE
Leukocytes, UA: NEGATIVE
Nitrite, UA: NEGATIVE
Protein, UA: NEGATIVE
Spec Grav, UA: >=1.03 — AB (ref 1.010–1.025)
Urobilinogen, UA: 1 E.U./dL
pH, UA: 5.5 (ref 5.0–8.0)

## 2019-10-07 LAB — GLUCOSE, POCT (MANUAL RESULT ENTRY): POC Glucose: 185 mg/dl — AB (ref 70–99)

## 2019-10-07 NOTE — Progress Notes (Signed)
Established Patient Office Visit  Subjective:  Patient ID: Riley Hood, male    DOB: 12/22/1965  Age: 54 y.o. MRN: [id number]  CC:  Chief Complaint  Patient presents with  . Hypertension  . Diabetes    HPI LEVELLE EDELEN presents for follow up. He has a past medical history of Hypertension and new onset Diabetes.    Diabetes Mellitus Patient presents for follow up of diabetes. Current symptoms include: hyperglycemia, paresthesia of the feet and visual disturbances. Symptoms have stabilized. Patient denies hypoglycemia , nausea, polydipsia and polyuria. Evaluation to date has included: fasting blood sugar, fasting lipid panel and hemoglobin A1C.  Home sugars: varies. He did not bring in glucometer.. Current treatment: more intensive attention to diet which has been effective and Continued insulin which has been effective. Last dilated eye exam: unknown.  Hypertension Patient is here for follow-up of elevated blood pressure. He is exercising and is adherent to a low-salt diet. Blood pressure is not monitored at home. Cardiac symptoms: none. Patient denies chest pain, dyspnea, fatigue, irregular heart beat, lower extremity edema and palpitations. Cardiovascular risk factors: diabetes mellitus, dyslipidemia, male gender and obesity (BMI >= 30 kg/m2). Use of agents associated with hypertension: none. History of target organ damage: angina/ prior myocardial infarction.  Past Medical History:  Diagnosis Date  . Hypertension     History reviewed. No pertinent surgical history.  History reviewed. No pertinent family history.  Social History   Socioeconomic History  . Marital status: Single    Spouse name: Not on file  . Number of children: Not on file  . Years of education: Not on file  . Highest education level: Not on file  Occupational History  . Not on file  Tobacco Use  . Smoking status: Never Smoker  . Smokeless tobacco: Never Used  Substance and Sexual Activity  .  Alcohol use: Yes    Comment: occasional beer  . Drug use: No  . Sexual activity: Not on file  Other Topics Concern  . Not on file  Social History Narrative  . Not on file   Social Determinants of Health   Financial Resource Strain:   . Difficulty of Paying Living Expenses:   Food Insecurity:   . Worried About Charity fundraiser in the Last Year:   . Arboriculturist in the Last Year:   Transportation Needs:   . Film/video editor (Medical):   Marland Kitchen Lack of Transportation (Non-Medical):   Physical Activity:   . Days of Exercise per Week:   . Minutes of Exercise per Session:   Stress:   . Feeling of Stress :   Social Connections:   . Frequency of Communication with Friends and Family:   . Frequency of Social Gatherings with Friends and Family:   . Attends Religious Services:   . Active Member of Clubs or Organizations:   . Attends Archivist Meetings:   Marland Kitchen Marital Status:   Intimate Partner Violence:   . Fear of Current or Ex-Partner:   . Emotionally Abused:   Marland Kitchen Physically Abused:   . Sexually Abused:     Outpatient Medications Prior to Visit  Medication Sig Dispense Refill  . aspirin 81 MG EC tablet Take 1 tablet (81 mg total) by mouth daily. 30 tablet 12  . atorvastatin (LIPITOR) 40 MG tablet Take 1 tablet (40 mg total) by mouth daily at 6 PM. 30 tablet 3  . blood glucose meter kit and supplies KIT  Dispense based on patient and insurance preference. Use up to four times daily as directed. (FOR ICD-9 250.00, 250.01). 1 each 0  . insulin aspart (NOVOLOG) 100 UNIT/ML injection Inject 5 Units into the skin 3 (three) times daily with meals. 10 mL 11  . insulin glargine (LANTUS) 100 UNIT/ML injection Inject 0.34 mLs (34 Units total) into the skin 2 (two) times daily. 10 mL 11  . metoprolol tartrate (LOPRESSOR) 50 MG tablet Take 1 tablet (50 mg total) by mouth 2 (two) times daily. 60 tablet 2  . vitamin B-12 (CYANOCOBALAMIN) 250 MCG tablet Take 250 mcg by mouth daily.     Marland Kitchen VITAMIN D PO Take 1 tablet by mouth daily.    . polyethylene glycol (MIRALAX / GLYCOLAX) 17 g packet Take 17 g by mouth daily. (Patient not taking: Reported on 10/07/2019) 14 each 0  . senna-docusate (SENOKOT-S) 8.6-50 MG tablet Take 2 tablets by mouth 2 (two) times daily. (Patient not taking: Reported on 10/07/2019)    . VITAMIN E PO Take 1 capsule by mouth daily.    . insulin aspart (NOVOLOG) 100 UNIT/ML injection CBG 70 - 120: 0 units CBG 121 - 150: 3 units CBG 151 - 200: 4 units CBG 201 - 250: 7 units CBG 251 - 300: 11 units CBG 301 - 350: 15 units CBG 351 - 400: 20 units 10 mL 11   No facility-administered medications prior to visit.    No Known Allergies  ROS Review of Systems  All other systems reviewed and are negative.     Objective:    Physical Exam  Constitutional: He is oriented to person, place, and time. He appears well-developed.  Obese   HENT:  Head: Normocephalic.  Cardiovascular: Normal rate, regular rhythm, normal heart sounds and intact distal pulses.  Pulmonary/Chest: Effort normal and breath sounds normal.  Abdominal: Bowel sounds are normal.  obese  Musculoskeletal:     Comments: ROM within normal range for patient  Walks with a limp  Neurological: He is alert and oriented to person, place, and time.  Skin: Skin is warm and dry.     Monofilament normal  Psychiatric: He has a normal mood and affect. His behavior is normal. Judgment and thought content normal.    BP 114/80 (BP Location: Left Arm, Patient Position: Sitting, Cuff Size: Large)   Pulse 67   Temp 98 F (36.7 C) (Oral)   Resp 16   Ht 6' (1.829 m)   Wt 299 lb (135.6 kg)   SpO2 97%   BMI 40.55 kg/m  Wt Readings from Last 3 Encounters:  10/07/19 299 lb (135.6 kg)  08/18/19 294 lb 1.6 oz (133.4 kg)  06/05/15 280 lb (127 kg)     Health Maintenance Due  Topic Date Due  . PNEUMOCOCCAL POLYSACCHARIDE VACCINE AGE 40-64 HIGH RISK  Never done  . FOOT EXAM  Never done  .  OPHTHALMOLOGY EXAM  Never done  . COLONOSCOPY  Never done  . URINE MICROALBUMIN  06/04/2016    There are no preventive care reminders to display for this patient.  Lab Results  Component Value Date   TSH 1.144 05/26/2015   Lab Results  Component Value Date   WBC 7.3 10/07/2019   HGB 14.2 10/07/2019   HCT 41.8 10/07/2019   MCV 74 (L) 10/07/2019   PLT 269 10/07/2019   Lab Results  Component Value Date   NA 140 10/07/2019   K 4.4 10/07/2019   CO2 24 08/20/2019  GLUCOSE 119 (H) 10/07/2019   BUN 15 10/07/2019   CREATININE 0.94 10/07/2019   BILITOT 0.3 10/07/2019   ALKPHOS 88 10/07/2019   AST 19 10/07/2019   ALT 33 04/24/2009   PROT 7.1 10/07/2019   ALBUMIN 3.9 10/07/2019   CALCIUM 9.9 10/07/2019   ANIONGAP 8 08/20/2019   Lab Results  Component Value Date   CHOL 356 (H) 08/17/2019   Lab Results  Component Value Date   HDL 37 (L) 08/17/2019   Lab Results  Component Value Date   LDLCALC UNABLE TO CALCULATE IF TRIGLYCERIDE OVER 400 mg/dL 08/17/2019   Lab Results  Component Value Date   TRIG 620 (H) 08/17/2019   Lab Results  Component Value Date   CHOLHDL 9.6 08/17/2019   Lab Results  Component Value Date   HGBA1C 11.8 (H) 08/17/2019      Assessment & Plan:   Problem List Items Addressed This Visit      High   Hypertension   Relevant Orders   Urinalysis Dipstick (Completed)   Microalbumin, urine   CBC with Differential/Platelet (Completed)   Comp. Metabolic Panel (12) (Completed)   Type 2 diabetes mellitus with other specified complication (HCC) - Primary   Relevant Orders   Urinalysis Dipstick (Completed)   Microalbumin, urine   Glucose (CBG) (Completed)   CBC with Differential/Platelet (Completed)   Comp. Metabolic Panel (12) (Completed)    Other Visit Diagnoses    Uncomplicated asthma, unspecified asthma severity, unspecified whether persistent          No orders of the defined types were placed in this encounter.   Follow-up: Return  in about 3 months (around 01/07/2020).    Vevelyn Francois, NP

## 2019-10-07 NOTE — Patient Instructions (Signed)
Diabetes Mellitus and Exercise Exercising regularly is important for your overall health, especially when you have diabetes (diabetes mellitus). Exercising is not only about losing weight. It has many other health benefits, such as increasing muscle strength and bone density and reducing body fat and stress. This leads to improved fitness, flexibility, and endurance, all of which result in better overall health. Exercise has additional benefits for people with diabetes, including:  Reducing appetite.  Helping to lower and control blood glucose.  Lowering blood pressure.  Helping to control amounts of fatty substances (lipids) in the blood, such as cholesterol and triglycerides.  Helping the body to respond better to insulin (improving insulin sensitivity).  Reducing how much insulin the body needs.  Decreasing the risk for heart disease by: ? Lowering cholesterol and triglyceride levels. ? Increasing the levels of good cholesterol. ? Lowering blood glucose levels. What is my activity plan? Your health care provider or certified diabetes educator can help you make a plan for the type and frequency of exercise (activity plan) that works for you. Make sure that you:  Do at least 150 minutes of moderate-intensity or vigorous-intensity exercise each week. This could be brisk walking, biking, or water aerobics. ? Do stretching and strength exercises, such as yoga or weightlifting, at least 2 times a week. ? Spread out your activity over at least 3 days of the week.  Get some form of physical activity every day. ? Do not go more than 2 days in a row without some kind of physical activity. ? Avoid being inactive for more than 30 minutes at a time. Take frequent breaks to walk or stretch.  Choose a type of exercise or activity that you enjoy, and set realistic goals.  Start slowly, and gradually increase the intensity of your exercise over time. What do I need to know about managing my  diabetes?   Check your blood glucose before and after exercising. ? If your blood glucose is 240 mg/dL (13.3 mmol/L) or higher before you exercise, check your urine for ketones. If you have ketones in your urine, do not exercise until your blood glucose returns to normal. ? If your blood glucose is 100 mg/dL (5.6 mmol/L) or lower, eat a snack containing 15-20 grams of carbohydrate. Check your blood glucose 15 minutes after the snack to make sure that your level is above 100 mg/dL (5.6 mmol/L) before you start your exercise.  Know the symptoms of low blood glucose (hypoglycemia) and how to treat it. Your risk for hypoglycemia increases during and after exercise. Common symptoms of hypoglycemia can include: ? Hunger. ? Anxiety. ? Sweating and feeling clammy. ? Confusion. ? Dizziness or feeling light-headed. ? Increased heart rate or palpitations. ? Blurry vision. ? Tingling or numbness around the mouth, lips, or tongue. ? Tremors or shakes. ? Irritability.  Keep a rapid-acting carbohydrate snack available before, during, and after exercise to help prevent or treat hypoglycemia.  Avoid injecting insulin into areas of the body that are going to be exercised. For example, avoid injecting insulin into: ? The arms, when playing tennis. ? The legs, when jogging.  Keep records of your exercise habits. Doing this can help you and your health care provider adjust your diabetes management plan as needed. Write down: ? Food that you eat before and after you exercise. ? Blood glucose levels before and after you exercise. ? The type and amount of exercise you have done. ? When your insulin is expected to peak, if you use   insulin. Avoid exercising at times when your insulin is peaking.  When you start a new exercise or activity, work with your health care provider to make sure the activity is safe for you, and to adjust your insulin, medicines, or food intake as needed.  Drink plenty of water while  you exercise to prevent dehydration or heat stroke. Drink enough fluid to keep your urine clear or pale yellow. Summary  Exercising regularly is important for your overall health, especially when you have diabetes (diabetes mellitus).  Exercising has many health benefits, such as increasing muscle strength and bone density and reducing body fat and stress.  Your health care provider or certified diabetes educator can help you make a plan for the type and frequency of exercise (activity plan) that works for you.  When you start a new exercise or activity, work with your health care provider to make sure the activity is safe for you, and to adjust your insulin, medicines, or food intake as needed. This information is not intended to replace advice given to you by your health care provider. Make sure you discuss any questions you have with your health care provider. Document Revised: 01/26/2017 Document Reviewed: 12/14/2015 Elsevier Patient Education  2020 Elsevier Inc.  

## 2019-10-07 NOTE — Progress Notes (Signed)
Integrated Behavioral Health Case Management Referral Note  10/07/2019 Name: Riley Hood MRN: [id number] DOB: Apr 22, 1966 Riley Hood is a 54 y.o. year old male who sees Dionisio David, NP for primary care. LCSW was consulted to assess patient's access to community resources and assist the patient with [employer] .  Assessment: Patient experiencing need for vision exam. Patient has not had a vision exam in several years. NP referred for vision resources as patient is uninsured. Assessed patient's need and eligibility. Patient is eligible for [facility] [place] program per reported income. Patient to provide proof of income before application can be submitted.   Review of patient status, including review of consultants reports, relevant laboratory and other test results, and collaboration with appropriate care team members and the patient's provider was performed as part of comprehensive patient evaluation and provision of services.    SDOH (Social Determinants of Health) assessments performed: No    Outpatient Encounter Medications as of 10/07/2019  Medication Sig  . aspirin 81 MG EC tablet Take 1 tablet (81 mg total) by mouth daily.  Marland Kitchen atorvastatin (LIPITOR) 40 MG tablet Take 1 tablet (40 mg total) by mouth daily at 6 PM.  . blood glucose meter kit and supplies KIT Dispense based on patient and insurance preference. Use up to four times daily as directed. (FOR ICD-9 250.00, 250.01).  . insulin aspart (NOVOLOG) 100 UNIT/ML injection Inject 5 Units into the skin 3 (three) times daily with meals.  . insulin glargine (LANTUS) 100 UNIT/ML injection Inject 0.34 mLs (34 Units total) into the skin 2 (two) times daily.  . metoprolol tartrate (LOPRESSOR) 50 MG tablet Take 1 tablet (50 mg total) by mouth 2 (two) times daily.  . vitamin B-12 (CYANOCOBALAMIN) 250 MCG tablet Take 250 mcg by mouth daily.  Marland Kitchen VITAMIN D PO Take 1 tablet by mouth daily.  . polyethylene glycol (MIRALAX / GLYCOLAX) 17 g  packet Take 17 g by mouth daily. (Patient not taking: Reported on 10/07/2019)  . senna-docusate (SENOKOT-S) 8.6-50 MG tablet Take 2 tablets by mouth 2 (two) times daily. (Patient not taking: Reported on 10/07/2019)  . VITAMIN E PO Take 1 capsule by mouth daily.  . [DISCONTINUED] insulin aspart (NOVOLOG) 100 UNIT/ML injection CBG 70 - 120: 0 units CBG 121 - 150: 3 units CBG 151 - 200: 4 units CBG 201 - 250: 7 units CBG 251 - 300: 11 units CBG 301 - 350: 15 units CBG 351 - 400: 20 units   No facility-administered encounter medications on file as of 10/07/2019.    Goals Addressed   None      Follow up Plan: 1. Patient to provide proof of income and then CSW will submit [facility] [place] application. 2. CSW available as needed.  Riley Hood, [facility] [phone]

## 2019-10-08 LAB — CBC WITH DIFFERENTIAL/PLATELET
Basophils Absolute: 0.1 10*3/uL (ref 0.0–0.2)
Basos: 1 %
EOS (ABSOLUTE): 0.1 10*3/uL (ref 0.0–0.4)
Eos: 2 %
Hematocrit: 41.8 % (ref 37.5–51.0)
Hemoglobin: 14.2 g/dL (ref 13.0–17.7)
Immature Grans (Abs): 0 10*3/uL (ref 0.0–0.1)
Immature Granulocytes: 0 %
Lymphocytes Absolute: 2.6 10*3/uL (ref 0.7–3.1)
Lymphs: 35 %
MCH: 25.1 pg — ABNORMAL LOW (ref 26.6–33.0)
MCHC: 34 g/dL (ref 31.5–35.7)
MCV: 74 fL — ABNORMAL LOW (ref 79–97)
Monocytes Absolute: 0.8 10*3/uL (ref 0.1–0.9)
Monocytes: 11 %
Neutrophils Absolute: 3.8 10*3/uL (ref 1.4–7.0)
Neutrophils: 51 %
Platelets: 269 10*3/uL (ref 150–450)
RBC: 5.65 x10E6/uL (ref 4.14–5.80)
RDW: 14.8 % (ref 11.6–15.4)
WBC: 7.3 10*3/uL (ref 3.4–10.8)

## 2019-10-08 LAB — COMP. METABOLIC PANEL (12)
AST: 19 IU/L (ref 0–40)
Albumin/Globulin Ratio: 1.2 (ref 1.2–2.2)
Albumin: 3.9 g/dL (ref 3.8–4.9)
Alkaline Phosphatase: 88 IU/L (ref 39–117)
BUN/Creatinine Ratio: 16 (ref 9–20)
BUN: 15 mg/dL (ref 6–24)
Bilirubin Total: 0.3 mg/dL (ref 0.0–1.2)
Calcium: 9.9 mg/dL (ref 8.7–10.2)
Chloride: 104 mmol/L (ref 96–106)
Creatinine, Ser: 0.94 mg/dL (ref 0.76–1.27)
GFR calc Af Amer: 106 mL/min/{1.73_m2} (ref >59)
GFR calc non Af Amer: 92 mL/min/{1.73_m2} (ref >59)
Globulin, Total: 3.2 g/dL (ref 1.5–4.5)
Glucose: 119 mg/dL — ABNORMAL HIGH (ref 65–99)
Potassium: 4.4 mmol/L (ref 3.5–5.2)
Sodium: 140 mmol/L (ref 134–144)
Total Protein: 7.1 g/dL (ref 6.0–8.5)

## 2019-10-08 LAB — MICROALBUMIN, URINE: Microalbumin, Urine: 10.8 ug/mL

## 2019-10-09 ENCOUNTER — Telehealth: Payer: Self-pay

## 2019-10-09 NOTE — Telephone Encounter (Signed)
Called and advised of stable labs asked to work on diet and exercise.

## 2019-10-09 NOTE — Telephone Encounter (Signed)
-----   Message from Vevelyn Francois, NP sent at 10/09/2019  9:59 AM EDT ----- Please make patient aware that labs are stable overall.  Continue to encourage lifestyle modification and diet regular exercise

## 2019-10-15 ENCOUNTER — Telehealth: Payer: Self-pay | Admitting: Clinical

## 2019-10-15 NOTE — Telephone Encounter (Signed)
Integrated Behavioral Health Note  Received copy of supporting documents to accompany [facility] [place] application and submitted application. Called patient to advise that the application was submitted. Will plan to follow up with patient after hearing back from [facility] [place] regarding approval/denial for vision services.  [name], [facility] [phone]

## 2019-10-24 ENCOUNTER — Telehealth: Payer: Self-pay | Admitting: Clinical

## 2019-10-24 NOTE — Telephone Encounter (Signed)
Integrated Behavioral Health Note  Called patient and advised him that he was approved for a free eye exam through the [facility] [place] program. CSW advised patient of this and provided follow up instructions. Mailed patient's Vision  approval letter to patient. Encouraged patient to follow up with CSW for additional needs or if in need of assistance with eyeglasses after the exam.   [name], [place] [phone]

## 2019-11-08 MED FILL — !NOVOLOG 100UNITS/ML VIAL: 100/ML | 28 days supply | Qty: 10 | Fill #0

## 2019-11-08 MED FILL — ?METOPROLOL TART 50 MG TABL: 50 | 30 days supply | Qty: 60 | Fill #1

## 2019-11-08 MED FILL — !LANTUS 100 UNITS/ML VIAL: 100 | 28 days supply | Qty: 20 | Fill #1

## 2019-11-08 MED FILL — ?ATORVASTATIN 40MG TABLET: 40 | 30 days supply | Qty: 30 | Fill #1

## 2020-01-06 ENCOUNTER — Ambulatory Visit: Payer: Self-pay | Admitting: Nurse Practitioner

## 2020-04-20 MED FILL — NovoLOG 100 UNIT/ML SOLN: 100 | 28 days supply | Qty: 10 | Fill #1

## 2020-04-20 MED FILL — $LANTUS 100 UNITS/ML VIAL: 100 | 84 days supply | Qty: 60 | Fill #2

## 2020-06-19 ENCOUNTER — Ambulatory Visit: Payer: Self-pay | Admitting: Nurse Practitioner

## 2020-07-20 MED FILL — $LANTUS 100 UNITS/ML VIAL: 100 | 14 days supply | Qty: 10 | Fill #3

## 2020-07-28 MED FILL — NovoLOG 100 UNIT/ML SOLN: 100 | 28 days supply | Qty: 10 | Fill #2

## 2020-07-28 MED FILL — $LANTUS 100 UNITS/ML VIAL: 100 | 14 days supply | Qty: 10 | Fill #3

## 2020-09-15 ENCOUNTER — Encounter (HOSPITAL_COMMUNITY): Payer: Self-pay | Admitting: Emergency Medicine

## 2020-09-15 ENCOUNTER — Inpatient Hospital Stay (HOSPITAL_COMMUNITY)
Admission: EM | Admit: 2020-09-15 | Discharge: 2020-09-17 | DRG: 638 | Disposition: A | Discharge status: Home / Self Care | Payer: Self-pay | Attending: Internal Medicine | Admitting: Internal Medicine

## 2020-09-15 ENCOUNTER — Ambulatory Visit (HOSPITAL_COMMUNITY)
Admission: EM | Admit: 2020-09-15 | Discharge: 2020-09-15 | Disposition: A | Discharge status: Home / Self Care | Payer: Self-pay | Attending: Family Medicine | Admitting: Family Medicine

## 2020-09-15 ENCOUNTER — Emergency Department (HOSPITAL_COMMUNITY): Payer: Self-pay

## 2020-09-15 ENCOUNTER — Other Ambulatory Visit: Payer: Self-pay

## 2020-09-15 ENCOUNTER — Encounter (HOSPITAL_COMMUNITY): Payer: Self-pay

## 2020-09-15 DIAGNOSIS — I10 Essential (primary) hypertension: Secondary | ICD-10-CM | POA: Diagnosis present

## 2020-09-15 DIAGNOSIS — T383X6A Underdosing of insulin and oral hypoglycemic [antidiabetic] drugs, initial encounter: Secondary | ICD-10-CM | POA: Diagnosis present

## 2020-09-15 DIAGNOSIS — Y92009 Unspecified place in unspecified non-institutional (private) residence as the place of occurrence of the external cause: Secondary | ICD-10-CM

## 2020-09-15 DIAGNOSIS — E1142 Type 2 diabetes mellitus with diabetic polyneuropathy: Secondary | ICD-10-CM | POA: Diagnosis present

## 2020-09-15 DIAGNOSIS — Z7982 Long term (current) use of aspirin: Secondary | ICD-10-CM

## 2020-09-15 DIAGNOSIS — E1110 Type 2 diabetes mellitus with ketoacidosis without coma: Principal | ICD-10-CM | POA: Diagnosis present

## 2020-09-15 DIAGNOSIS — R81 Glycosuria: Secondary | ICD-10-CM

## 2020-09-15 DIAGNOSIS — N179 Acute kidney failure, unspecified: Secondary | ICD-10-CM | POA: Diagnosis present

## 2020-09-15 DIAGNOSIS — Z6839 Body mass index (BMI) 39.0-39.9, adult: Secondary | ICD-10-CM

## 2020-09-15 DIAGNOSIS — R739 Hyperglycemia, unspecified: Secondary | ICD-10-CM

## 2020-09-15 DIAGNOSIS — E669 Obesity, unspecified: Secondary | ICD-10-CM | POA: Diagnosis present

## 2020-09-15 DIAGNOSIS — E860 Dehydration: Secondary | ICD-10-CM | POA: Diagnosis present

## 2020-09-15 DIAGNOSIS — K219 Gastro-esophageal reflux disease without esophagitis: Secondary | ICD-10-CM

## 2020-09-15 DIAGNOSIS — E785 Hyperlipidemia, unspecified: Secondary | ICD-10-CM | POA: Diagnosis present

## 2020-09-15 DIAGNOSIS — Z91120 Patient's intentional underdosing of medication regimen due to financial hardship: Secondary | ICD-10-CM

## 2020-09-15 DIAGNOSIS — E1169 Type 2 diabetes mellitus with other specified complication: Secondary | ICD-10-CM | POA: Diagnosis present

## 2020-09-15 DIAGNOSIS — Z79899 Other long term (current) drug therapy: Secondary | ICD-10-CM

## 2020-09-15 DIAGNOSIS — G629 Polyneuropathy, unspecified: Secondary | ICD-10-CM

## 2020-09-15 DIAGNOSIS — Z794 Long term (current) use of insulin: Secondary | ICD-10-CM

## 2020-09-15 DIAGNOSIS — E0810 Diabetes mellitus due to underlying condition with ketoacidosis without coma: Secondary | ICD-10-CM

## 2020-09-15 DIAGNOSIS — R350 Frequency of micturition: Secondary | ICD-10-CM

## 2020-09-15 DIAGNOSIS — Z20822 Contact with and (suspected) exposure to covid-19: Secondary | ICD-10-CM | POA: Diagnosis present

## 2020-09-15 DIAGNOSIS — E875 Hyperkalemia: Secondary | ICD-10-CM | POA: Diagnosis present

## 2020-09-15 DIAGNOSIS — E874 Mixed disorder of acid-base balance: Secondary | ICD-10-CM | POA: Diagnosis present

## 2020-09-15 DIAGNOSIS — E118 Type 2 diabetes mellitus with unspecified complications: Secondary | ICD-10-CM | POA: Diagnosis present

## 2020-09-15 DIAGNOSIS — R5383 Other fatigue: Secondary | ICD-10-CM

## 2020-09-15 HISTORY — DX: Type 2 diabetes mellitus without complications: E11.9

## 2020-09-15 LAB — BASIC METABOLIC PANEL
Anion gap: 18 — ABNORMAL HIGH (ref 5–15)
Anion gap: 10 (ref 5–15)
BUN: 37 mg/dL — ABNORMAL HIGH (ref 6–20)
BUN: 27 mg/dL — ABNORMAL HIGH (ref 6–20)
CO2: 20 mmol/L — ABNORMAL LOW (ref 22–32)
CO2: 27 mmol/L (ref 22–32)
Calcium: 11.1 mg/dL — ABNORMAL HIGH (ref 8.9–10.3)
Calcium: 10.5 mg/dL — ABNORMAL HIGH (ref 8.9–10.3)
Chloride: 81 mmol/L — ABNORMAL LOW (ref 98–111)
Chloride: 94 mmol/L — ABNORMAL LOW (ref 98–111)
Creatinine, Ser: 1.73 mg/dL — ABNORMAL HIGH (ref 0.61–1.24)
Creatinine, Ser: 1.02 mg/dL (ref 0.61–1.24)
GFR, Estimated: 46 mL/min — ABNORMAL LOW (ref >60)
GFR, Estimated: >60 mL/min (ref >60)
Glucose, Bld: 771 mg/dL (ref 70–99)
Glucose, Bld: 244 mg/dL — ABNORMAL HIGH (ref 70–99)
Potassium: 6.6 mmol/L (ref 3.5–5.1)
Potassium: 3.9 mmol/L (ref 3.5–5.1)
Sodium: 119 mmol/L — CL (ref 135–145)
Sodium: 131 mmol/L — ABNORMAL LOW (ref 135–145)

## 2020-09-15 LAB — I-STAT VENOUS BLOOD GAS, ED
Acid-Base Excess: 0 mmol/L (ref 0.0–2.0)
Bicarbonate: 23.4 mmol/L (ref 20.0–28.0)
Calcium, Ion: 1.27 mmol/L (ref 1.15–1.40)
HCT: 50 % (ref 39.0–52.0)
Hemoglobin: 17 g/dL (ref 13.0–17.0)
O2 Saturation: 96 %
Potassium: 6.7 mmol/L (ref 3.5–5.1)
Sodium: 119 mmol/L — CL (ref 135–145)
TCO2: 24 mmol/L (ref 22–32)
pCO2, Ven: 34.4 mmHg — ABNORMAL LOW (ref 44.0–60.0)
pH, Ven: 7.44 — ABNORMAL HIGH (ref 7.250–7.430)
pO2, Ven: 80 mmHg — ABNORMAL HIGH (ref 32.0–45.0)

## 2020-09-15 LAB — CBC
HCT: 45.4 % (ref 39.0–52.0)
Hemoglobin: 16.2 g/dL (ref 13.0–17.0)
MCH: 26 pg (ref 26.0–34.0)
MCHC: 35.7 g/dL (ref 30.0–36.0)
MCV: 72.9 fL — ABNORMAL LOW (ref 80.0–100.0)
Platelets: 261 10*3/uL (ref 150–400)
RBC: 6.23 MIL/uL — ABNORMAL HIGH (ref 4.22–5.81)
RDW: 14.1 % (ref 11.5–15.5)
WBC: 10.5 10*3/uL (ref 4.0–10.5)
nRBC: 0 % (ref 0.0–0.2)

## 2020-09-15 LAB — POCT URINALYSIS DIPSTICK, ED / UC
Bilirubin Urine: NEGATIVE
Glucose, UA: >=1000 mg/dL — AB
Hgb urine dipstick: NEGATIVE
Ketones, ur: 40 mg/dL — AB
Leukocytes,Ua: NEGATIVE
Nitrite: NEGATIVE
Protein, ur: NEGATIVE mg/dL
Specific Gravity, Urine: <=1.005 (ref 1.005–1.030)
Urobilinogen, UA: 0.2 mg/dL (ref 0.0–1.0)
pH: 5 (ref 5.0–8.0)

## 2020-09-15 LAB — HEPATIC FUNCTION PANEL
ALT: 20 U/L (ref 0–44)
AST: 18 U/L (ref 15–41)
Albumin: 3.8 g/dL (ref 3.5–5.0)
Alkaline Phosphatase: 84 U/L (ref 38–126)
Bilirubin, Direct: 0.1 mg/dL (ref 0.0–0.2)
Indirect Bilirubin: 1.6 mg/dL — ABNORMAL HIGH (ref 0.3–0.9)
Total Bilirubin: 1.7 mg/dL — ABNORMAL HIGH (ref 0.3–1.2)
Total Protein: 8.3 g/dL — ABNORMAL HIGH (ref 6.5–8.1)

## 2020-09-15 LAB — URINALYSIS, ROUTINE W REFLEX MICROSCOPIC
Bacteria, UA: NONE SEEN
Bilirubin Urine: NEGATIVE
Glucose, UA: >=500 mg/dL — AB
Hgb urine dipstick: NEGATIVE
Ketones, ur: 20 mg/dL — AB
Leukocytes,Ua: NEGATIVE
Nitrite: NEGATIVE
Protein, ur: NEGATIVE mg/dL
Specific Gravity, Urine: 1.024 (ref 1.005–1.030)
pH: 5 (ref 5.0–8.0)

## 2020-09-15 LAB — BLOOD GAS, VENOUS
Acid-Base Excess: 6.5 mmol/L — ABNORMAL HIGH (ref 0.0–2.0)
Bicarbonate: 30.8 mmol/L — ABNORMAL HIGH (ref 20.0–28.0)
FIO2: 21
O2 Saturation: 90.1 %
Patient temperature: 37
pCO2, Ven: 46.8 mmHg (ref 44.0–60.0)
pH, Ven: 7.433 — ABNORMAL HIGH (ref 7.250–7.430)
pO2, Ven: 61.1 mmHg — ABNORMAL HIGH (ref 32.0–45.0)

## 2020-09-15 LAB — CBG MONITORING, ED
Glucose-Capillary: >600 mg/dL (ref 70–99)
Glucose-Capillary: >600 mg/dL (ref 70–99)
Glucose-Capillary: 596 mg/dL (ref 70–99)
Glucose-Capillary: 574 mg/dL (ref 70–99)
Glucose-Capillary: 388 mg/dL — ABNORMAL HIGH (ref 70–99)
Glucose-Capillary: 492 mg/dL — ABNORMAL HIGH (ref 70–99)
Glucose-Capillary: 338 mg/dL — ABNORMAL HIGH (ref 70–99)
Glucose-Capillary: 283 mg/dL — ABNORMAL HIGH (ref 70–99)
Glucose-Capillary: 264 mg/dL — ABNORMAL HIGH (ref 70–99)
Glucose-Capillary: 290 mg/dL — ABNORMAL HIGH (ref 70–99)

## 2020-09-15 LAB — HIV ANTIBODY (ROUTINE TESTING W REFLEX): HIV Screen 4th Generation wRfx: NONREACTIVE

## 2020-09-15 LAB — HEMOGLOBIN A1C
Hgb A1c MFr Bld: 13.1 % — ABNORMAL HIGH (ref 4.8–5.6)
Mean Plasma Glucose: 329.27 mg/dL

## 2020-09-15 LAB — SARS CORONAVIRUS 2 (TAT 6-24 HRS): SARS Coronavirus 2: NEGATIVE

## 2020-09-15 LAB — BETA-HYDROXYBUTYRIC ACID: Beta-Hydroxybutyric Acid: 3.88 mmol/L — ABNORMAL HIGH (ref 0.05–0.27)

## 2020-09-15 MED ORDER — SODIUM ZIRCONIUM CYCLOSILICATE 10 G PO PACK
10.0000 g | PACK | Freq: Once | ORAL | Status: AC
  Administered 2020-09-15: 10 g via ORAL
  Filled 2020-09-15: qty 1

## 2020-09-15 MED ORDER — ALBUTEROL SULFATE HFA 108 (90 BASE) MCG/ACT IN AERS
2.0000 | INHALATION_SPRAY | Freq: Once | RESPIRATORY_TRACT | Status: AC
  Administered 2020-09-15: 2 via RESPIRATORY_TRACT
  Filled 2020-09-15: qty 6.7

## 2020-09-15 MED ORDER — CALCIUM GLUCONATE-NACL 1-0.675 GM/50ML-% IV SOLN
1.0000 g | Freq: Once | INTRAVENOUS | Status: AC
  Administered 2020-09-15: 1000 mg via INTRAVENOUS
  Filled 2020-09-15: qty 50

## 2020-09-15 MED ORDER — LACTATED RINGERS IV SOLN: INTRAVENOUS | Status: DC

## 2020-09-15 MED ORDER — METOPROLOL TARTRATE 50 MG PO TABS
50.0000 mg | ORAL_TABLET | Freq: Two times a day (BID) | ORAL | Status: DC
  Administered 2020-09-15 – 2020-09-17 (×4): 50 mg via ORAL
  Filled 2020-09-15: qty 2
  Filled 2020-09-15 (×3): qty 1

## 2020-09-15 MED ORDER — VITAMIN B-12 250 MCG PO TABS: 250.0000 ug | ORAL_TABLET | Freq: Every day | ORAL | Status: DC

## 2020-09-15 MED ORDER — SODIUM BICARBONATE 8.4 % IV SOLN
50.0000 meq | Freq: Once | INTRAVENOUS | Status: AC
  Administered 2020-09-15: 50 meq via INTRAVENOUS
  Filled 2020-09-15: qty 50

## 2020-09-15 MED ORDER — DEXTROSE 50 % IV SOLN: 0.0000 mL | INTRAVENOUS | Status: DC | PRN

## 2020-09-15 MED ORDER — DEXTROSE IN LACTATED RINGERS 5 % IV SOLN: INTRAVENOUS | Status: DC

## 2020-09-15 MED ORDER — ATORVASTATIN CALCIUM 40 MG PO TABS
40.0000 mg | ORAL_TABLET | Freq: Every day | ORAL | Status: DC
  Administered 2020-09-16: 40 mg via ORAL
  Filled 2020-09-15: qty 1

## 2020-09-15 MED ORDER — SODIUM CHLORIDE 0.9 % IV BOLUS: 1000.0000 mL | Freq: Once | INTRAVENOUS | Status: DC

## 2020-09-15 MED ORDER — INSULIN REGULAR(HUMAN) IN NACL 100-0.9 UT/100ML-% IV SOLN
INTRAVENOUS | Status: DC
  Administered 2020-09-15: 13 [IU]/h via INTRAVENOUS
  Filled 2020-09-15: qty 100

## 2020-09-15 MED ORDER — HEPARIN SODIUM (PORCINE) 5000 UNIT/ML IJ SOLN
5000.0000 [IU] | Freq: Three times a day (TID) | INTRAMUSCULAR | Status: DC
  Administered 2020-09-16 – 2020-09-17 (×5): 5000 [IU] via SUBCUTANEOUS
  Filled 2020-09-15 (×5): qty 1

## 2020-09-15 MED ORDER — ASPIRIN EC 81 MG PO TBEC
81.0000 mg | DELAYED_RELEASE_TABLET | Freq: Every day | ORAL | Status: DC
  Administered 2020-09-15 – 2020-09-17 (×3): 81 mg via ORAL
  Filled 2020-09-15 (×3): qty 1

## 2020-09-15 MED ORDER — VITAMIN B-12 100 MCG PO TABS
250.0000 ug | ORAL_TABLET | Freq: Every day | ORAL | Status: DC
  Administered 2020-09-15 – 2020-09-17 (×3): 250 ug via ORAL
  Filled 2020-09-15: qty 1
  Filled 2020-09-15 (×2): qty 3

## 2020-09-15 MED ORDER — LACTATED RINGERS IV BOLUS
20.0000 mL/kg | Freq: Once | INTRAVENOUS | Status: AC
  Administered 2020-09-15: 1000 mL via INTRAVENOUS

## 2020-09-15 NOTE — ED Triage Notes (Signed)
Pt arrives with c/o of hyperglycemia (>600), polydipsia, and polyuria. Pt at UC had glucose in urine.

## 2020-09-15 NOTE — ED Notes (Signed)
Notified Tegeler, MD about the following criticals:  Na 119  K+ 6.6  Glucose 771

## 2020-09-15 NOTE — Discharge Instructions (Signed)
Your urine shows glucose and ketones.  Your blood sugar is reading over 600 on our machine.  Go to the ER for further evaluation and treatment, concern for DKA

## 2020-09-15 NOTE — ED Notes (Signed)
Patient is being discharged from the Urgent Care and sent to the Emergency Department via a friend/family member . Per Colletta Maryland NP, patient is in need of higher level of care due to Blood Sugar Levels/ Uncontrolled DM. Patient is aware and verbalizes understanding of plan of care.  Vitals:   09/15/20 1026  BP: (!) 139/91  Pulse: (!) 102  Resp: 20  Temp: 98 F (36.7 C)  SpO2: 95%

## 2020-09-15 NOTE — ED Provider Notes (Signed)
[facility] EMERGENCY DEPARTMENT Provider Note   CSN: [id number] Arrival date & time: 09/15/20  1110     History Chief Complaint  Patient presents with  . Hyperglycemia    Riley Hood is a 55 y.o. male.  The history is provided by the patient and medical records. No language interpreter was used.  Hyperglycemia Blood sugar level PTA:  >600 Severity:  Severe Onset quality:  Gradual Timing:  Constant Progression:  Unchanged Chronicity:  Recurrent Diabetes status:  Controlled with insulin Relieved by:  Nothing Ineffective treatments:  None tried Associated symptoms: dehydration, fatigue, increased thirst, malaise and polyuria   Associated symptoms: no abdominal pain, no altered mental status, no blurred vision, no chest pain, no confusion, no dizziness, no dysuria, no fever, no nausea, no shortness of breath, no syncope, no vomiting, no weakness and no weight change        Past Medical History:  Diagnosis Date  . Hypertension     Patient Active Problem List   Diagnosis Date Noted  . Type 2 diabetes mellitus with other specified complication ([place]) 67/06/4579  . Hyperosmolar hyperglycemic state (HHS) ([place]) 08/17/2019  . Obesity 06/05/2015  . Murmur, cardiac 06/05/2015  . Chest pain with moderate risk for cardiac etiology 05/26/2015  . Hypertension 05/25/2015  . Hypertensive urgency 05/25/2015    History reviewed. No pertinent surgical history.     Family History  Family history unknown: Yes    Social History   Tobacco Use  . Smoking status: Never Smoker  . Smokeless tobacco: Never Used  Substance Use Topics  . Alcohol use: Yes    Comment: occasional beer  . Drug use: No    Home Medications Prior to Admission medications   Medication Sig Start Date End Date Taking? Authorizing Provider  aspirin 81 MG EC tablet Take 1 tablet (81 mg total) by mouth daily. 08/20/19   Hosie Poisson, MD  atorvastatin (LIPITOR) 40 MG tablet Take 1  tablet (40 mg total) by mouth daily at 6 PM. 08/20/19   Hosie Poisson, MD  blood glucose meter kit and supplies KIT Dispense based on patient and insurance preference. Use up to four times daily as directed. (FOR ICD-9 250.00, 250.01). 08/20/19   Hosie Poisson, MD  insulin aspart (NOVOLOG) 100 UNIT/ML injection Inject 5 Units into the skin 3 (three) times daily with meals. 08/20/19   Hosie Poisson, MD  insulin glargine (LANTUS) 100 UNIT/ML injection Inject 0.34 mLs (34 Units total) into the skin 2 (two) times daily. 08/20/19   Hosie Poisson, MD  metoprolol tartrate (LOPRESSOR) 50 MG tablet Take 1 tablet (50 mg total) by mouth 2 (two) times daily. 08/20/19   Hosie Poisson, MD  polyethylene glycol (MIRALAX / GLYCOLAX) 17 g packet Take 17 g by mouth daily. Patient not taking: Reported on 10/07/2019 08/21/19   Hosie Poisson, MD  senna-docusate (SENOKOT-S) 8.6-50 MG tablet Take 2 tablets by mouth 2 (two) times daily. Patient not taking: Reported on 10/07/2019 08/20/19   Hosie Poisson, MD  vitamin B-12 (CYANOCOBALAMIN) 250 MCG tablet Take 250 mcg by mouth daily.    [provider]  VITAMIN D PO Take 1 tablet by mouth daily.    [provider]  VITAMIN E PO Take 1 capsule by mouth daily.    [provider]    Allergies    Patient has no known allergies.  Review of Systems   Review of Systems  Constitutional: Positive for fatigue. Negative for fever.  Eyes: Negative  for blurred vision.  Respiratory: Negative for shortness of breath.   Cardiovascular: Negative for chest pain and syncope.  Gastrointestinal: Negative for abdominal pain, nausea and vomiting.  Endocrine: Positive for polydipsia and polyuria.  Genitourinary: Negative for dysuria.  Neurological: Negative for dizziness and weakness.  Psychiatric/Behavioral: Negative for confusion.    Physical Exam Updated Vital Signs BP (!) 140/98 (BP Location: Left Arm)   Pulse (!) 127   Temp 98.4 F (36.9 C) (Oral)   Resp 18   SpO2  98%   Physical Exam  ED Results / Procedures / Treatments   Labs (all labs ordered are listed, but only abnormal results are displayed) Labs Reviewed  BASIC METABOLIC PANEL - Abnormal; Notable for the following components:      Result Value   Sodium 119 (*)    Potassium 6.6 (*)    Chloride 81 (*)    CO2 20 (*)    Glucose, Bld 771 (*)    BUN 37 (*)    Creatinine, Ser 1.73 (*)    Calcium 11.1 (*)    GFR, Estimated 46 (*)    Anion gap 18 (*)    All other components within normal limits  CBC - Abnormal; Notable for the following components:   RBC 6.23 (*)    MCV 72.9 (*)    All other components within normal limits  URINALYSIS, ROUTINE W REFLEX MICROSCOPIC - Abnormal; Notable for the following components:   Color, Urine STRAW (*)    Glucose, UA >=500 (*)    Ketones, ur 20 (*)    All other components within normal limits  BETA-HYDROXYBUTYRIC ACID - Abnormal; Notable for the following components:   Beta-Hydroxybutyric Acid 3.88 (*)    All other components within normal limits  HEPATIC FUNCTION PANEL - Abnormal; Notable for the following components:   Total Protein 8.3 (*)    Total Bilirubin 1.7 (*)    Indirect Bilirubin 1.6 (*)    All other components within normal limits  CBG MONITORING, ED - Abnormal; Notable for the following components:   Glucose-Capillary >600 (*)    All other components within normal limits  I-STAT VENOUS BLOOD GAS, ED - Abnormal; Notable for the following components:   pH, Ven 7.440 (*)    pCO2, Ven 34.4 (*)    pO2, Ven 80.0 (*)    Sodium 119 (*)    Potassium 6.7 (*)    All other components within normal limits  CBG MONITORING, ED - Abnormal; Notable for the following components:   Glucose-Capillary 596 (*)    All other components within normal limits  SARS CORONAVIRUS 2 (TAT 6-24 HRS)  URINE CULTURE  BLOOD GAS, VENOUS  BETA-HYDROXYBUTYRIC ACID  CBG MONITORING, ED    EKG EKG Interpretation  Date/Time:  Tuesday September 15 2020 14:22:52  EST Ventricular Rate:  108 PR Interval:    QRS Duration: 135 QT Interval:  358 QTC Calculation: 480 R Axis:   -77 Text Interpretation: Sinus tachycardia LAE, consider biatrial enlargement Nonspecific IVCD with LAD Left ventricular hypertrophy when compared to prior, sharper t waves and some new t 3wave inversions in leads 1 and AVL. No STEMI with no chest pain Confirmed by Antony Blackbird [phone]) on 09/15/2020 2:28:23 PM   Radiology DG Chest Portable 1 View  Result Date: 09/15/2020 CLINICAL DATA:  Cough. EXAM: PORTABLE CHEST 1 VIEW COMPARISON:  May 25, 2015. FINDINGS: The heart size and mediastinal contours are within normal limits. Both lungs are clear. The visualized skeletal structures  are unremarkable. IMPRESSION: No active disease. Electronically Signed   By: Marijo Conception M.D.   On: 09/15/2020 15:22    Procedures Procedures  CRITICAL CARE Performed by: Gwenyth Allegra Awa Bachicha Total critical care time: 35 minutes Critical care time was exclusive of separately billable procedures and treating other patients. Critical care was necessary to treat or prevent imminent or life-threatening deterioration. Critical care was time spent personally by me on the following activities: development of treatment plan with patient and/or surrogate as well as nursing, discussions with consultants, evaluation of patient's response to treatment, examination of patient, obtaining history from patient or surrogate, ordering and performing treatments and interventions, ordering and review of laboratory studies, ordering and review of radiographic studies, pulse oximetry and re-evaluation of patient's condition.   Medications Ordered in ED Medications  insulin regular, human (MYXREDLIN) 100 units/ 100 mL infusion (has no administration in time range)  lactated ringers infusion (has no administration in time range)  dextrose 5 % in lactated ringers infusion (has no administration in time range)  dextrose  50 % solution 0-50 mL (has no administration in time range)  calcium gluconate 1 g/ 50 mL sodium chloride IVPB (1,000 mg Intravenous New Bag/Given 09/15/20 1504)  lactated ringers bolus 2,718 mL (1,000 mLs Intravenous New Bag/Given 09/15/20 1442)  albuterol (VENTOLIN HFA) 108 (90 Base) MCG/ACT inhaler 2 puff (2 puffs Inhalation Given 09/15/20 1500)  sodium bicarbonate injection 50 mEq (50 mEq Intravenous Given 09/15/20 1458)  sodium zirconium cyclosilicate (LOKELMA) packet 10 g (10 g Oral Given 09/15/20 1459)    ED Course  I have reviewed the triage vital signs and the nursing notes.  Pertinent labs & imaging results that were available during my care of the patient were reviewed by me and considered in my medical decision making (see chart for details).    MDM Rules/Calculators/A&P                          Riley Hood is a 55 y.o. male with a past medical history significant for hypertension, obesity, diabetes with prior HHS who presents with 1 week of fatigue, cough, malaise, polyuria, and polydipsia.  Patient reports that he has only been taking insulin to treat his diabetes.  He reports he has not missed any doses.  He says that he has not had any dysuria but has been urinating more.  Denies any constipation or diarrhea.  Denies any chest pain or abdominal pain.  Denies shortness of breath.  Does report some cough that is nonproductive.  Denies fevers or chills.  On exam, lungs are clear and chest is nontender.  Abdomen is nontender.  Normal bowel sounds.  Patient has no evidence of acute trauma.  Patient is tachycardic on monitor evaluation as well as tachypneic.  He has not febrile or hypotensive initially.  Patient had screening labs in triage showing evidence of DKA.  Urinalysis does not show UTI.  We will get the other order set ordered for DKA and will likely admit when work-up is completed.  We will get a chest x-ray given the cough to look for pneumonia as an inciting factor of DKA.  We  will also get a Covid swab ordered.  Anticipate admission for DKA shortly.  2:32 PM Labs also returned showing a potassium of 6.6.  This is somewhat concerning as we did an EKG following this and his T waves are sharp compared to prior.  He does have acute  kidney injury compared to prior like related to the DKA and dehydration.  Will give the Endo tool and insulin drip to help with the potassium but we will also give some albuterol.  We will touch base with nephrology although patient is not a nephrology renal patient at this time to make sure they do not want any other interventions for the hyperkalemia which I anticipate will improve with the insulin drip.  Anticipate admission after chest x-ray and Covid test.  2:40 PM Just spoke to the nephrology team who recommended [place] giving 10 of Lokelma, 1 amp of sodium bicarb, and calcium gluconate.  They recommended we recheck the potassium after several hours and continue his work-up otherwise.  Anticipate admission shortly.   Final Clinical Impression(s) / ED Diagnoses Final diagnoses:  Hyperkalemia  Diabetic ketoacidosis without coma associated with diabetes mellitus due to underlying condition ([place])  AKI (acute kidney injury) ([place])  Dehydration    Clinical Impression: 1. Hyperkalemia   2. Diabetic ketoacidosis without coma associated with diabetes mellitus due to underlying condition ([place])   3. AKI (acute kidney injury) ([place])   4. Dehydration     Disposition: Admit  This note was prepared with assistance of Dragon voice recognition software. Occasional wrong-word or sound-a-like substitutions may have occurred due to the inherent limitations of voice recognition software.     Zarek Relph, Gwenyth Allegra, MD 09/15/20 [phone]

## 2020-09-15 NOTE — ED Provider Notes (Signed)
[place]    CSN: [id number] Arrival date & time: 09/15/20  0909      History   Chief Complaint Chief Complaint  Patient presents with  . Gastroesophageal Reflux  . Urinary Frequency    HPI Riley Hood is a 55 y.o. male.   Reports that he has been having urinary frequency for the last 3 weeks. Reports that he has not been checking his blood sugars at home. Reports that he has been having headaches, feeling nauseated, and poor. Reports that he has had uncontrolled blood sugars in the past.   The history is provided by the patient.  Gastroesophageal Reflux  Urinary Frequency    Past Medical History:  Diagnosis Date  . Hypertension     Patient Active Problem List   Diagnosis Date Noted  . Type 2 diabetes mellitus with other specified complication ([place]) 90/30/0923  . Hyperosmolar hyperglycemic state (HHS) ([place]) 08/17/2019  . Obesity 06/05/2015  . Murmur, cardiac 06/05/2015  . Chest pain with moderate risk for cardiac etiology 05/26/2015  . Hypertension 05/25/2015  . Hypertensive urgency 05/25/2015    History reviewed. No pertinent surgical history.     Home Medications    Prior to Admission medications   Medication Sig Start Date End Date Taking? Authorizing Provider  aspirin 81 MG EC tablet Take 1 tablet (81 mg total) by mouth daily. 08/20/19   Hosie Poisson, MD  atorvastatin (LIPITOR) 40 MG tablet Take 1 tablet (40 mg total) by mouth daily at 6 PM. 08/20/19   Hosie Poisson, MD  blood glucose meter kit and supplies KIT Dispense based on patient and insurance preference. Use up to four times daily as directed. (FOR ICD-9 250.00, 250.01). 08/20/19   Hosie Poisson, MD  insulin aspart (NOVOLOG) 100 UNIT/ML injection Inject 5 Units into the skin 3 (three) times daily with meals. 08/20/19   Hosie Poisson, MD  insulin glargine (LANTUS) 100 UNIT/ML injection Inject 0.34 mLs (34 Units total) into the skin 2 (two) times daily. 08/20/19   Hosie Poisson, MD   metoprolol tartrate (LOPRESSOR) 50 MG tablet Take 1 tablet (50 mg total) by mouth 2 (two) times daily. 08/20/19   Hosie Poisson, MD  polyethylene glycol (MIRALAX / GLYCOLAX) 17 g packet Take 17 g by mouth daily. Patient not taking: Reported on 10/07/2019 08/21/19   Hosie Poisson, MD  senna-docusate (SENOKOT-S) 8.6-50 MG tablet Take 2 tablets by mouth 2 (two) times daily. Patient not taking: Reported on 10/07/2019 08/20/19   Hosie Poisson, MD  vitamin B-12 (CYANOCOBALAMIN) 250 MCG tablet Take 250 mcg by mouth daily.    [provider]  VITAMIN D PO Take 1 tablet by mouth daily.    [provider]  VITAMIN E PO Take 1 capsule by mouth daily.    [provider]    Family History Family History  Family history unknown: Yes    Social History Social History   Tobacco Use  . Smoking status: Never Smoker  . Smokeless tobacco: Never Used  Substance Use Topics  . Alcohol use: Yes    Comment: occasional beer  . Drug use: No     Allergies   Patient has no known allergies.   Review of Systems Review of Systems  Genitourinary: Positive for frequency.     Physical Exam Triage Vital Signs ED Triage Vitals  Enc Vitals Group     BP 09/15/20 1026 (!) 139/91     Pulse Rate 09/15/20 1026 (!) 102  Resp 09/15/20 1026 20     Temp 09/15/20 1026 98 F (36.7 C)     Temp Source 09/15/20 1026 Oral     SpO2 09/15/20 1026 95 %     Weight --      Height --      Head Circumference --      Peak Flow --      Pain Score 09/15/20 1024 0     Pain Loc --      Pain Edu? --      Excl. in [place]? --    No data found.  Updated Vital Signs BP (!) 139/91 (BP Location: Right Arm)   Pulse (!) 102   Temp 98 F (36.7 C) (Oral)   Resp 20   SpO2 95%   Visual Acuity Right Eye Distance:   Left Eye Distance:   Bilateral Distance:    Right Eye Near:   Left Eye Near:    Bilateral Near:     Physical Exam Vitals and nursing note reviewed.  Constitutional:      General: He is  not in acute distress.    Appearance: Normal appearance. He is well-developed and well-nourished. He is ill-appearing.  HENT:     Head: Normocephalic and atraumatic.  Eyes:     Conjunctiva/sclera: Conjunctivae normal.  Cardiovascular:     Rate and Rhythm: Normal rate and regular rhythm.     Heart sounds: No murmur heard.   Pulmonary:     Effort: Pulmonary effort is normal. No respiratory distress.     Breath sounds: Normal breath sounds.  Abdominal:     Palpations: Abdomen is soft.     Tenderness: There is no abdominal tenderness.  Musculoskeletal:        General: No edema. Normal range of motion.     Cervical back: Normal range of motion and neck supple.  Skin:    General: Skin is warm and dry.     Capillary Refill: Capillary refill takes less than 2 seconds.  Neurological:     General: No focal deficit present.     Mental Status: He is alert and oriented to person, place, and time.  Psychiatric:        Mood and Affect: Mood and affect and mood normal.        Behavior: Behavior normal.        Thought Content: Thought content normal.      UC Treatments / Results  Labs (all labs ordered are listed, but only abnormal results are displayed) Labs Reviewed  POCT URINALYSIS DIPSTICK, ED / UC - Abnormal; Notable for the following components:      Result Value   Glucose, UA >=1000 (*)    Ketones, ur 40 (*)    All other components within normal limits  CBG MONITORING, ED - Abnormal; Notable for the following components:   Glucose-Capillary >600 (*)    All other components within normal limits    EKG   Radiology No results found.  Procedures Procedures (including critical care time)  Medications Ordered in UC Medications - No data to display  Initial Impression / Assessment and Plan / UC Course  I have reviewed the triage vital signs and the nursing notes.  Pertinent labs & imaging results that were available during my care of the patient were reviewed by me and  considered in my medical decision making (see chart for details).    Urinary frequency Glucosuria Hyperglycemia Fatigue  UA with ketones and >1000 glucose  CBG in office >600 Concern for DKA Discussed with patient that he would be best served in the ER for further workup Verbalized understanding agrees with plan To ER via POV for further evaluation and treatment    Final Clinical Impressions(s) / UC Diagnoses   Final diagnoses:  Urinary frequency  Glucosuria  Hyperglycemia  Other fatigue     Discharge Instructions     Your urine shows glucose and ketones.  Your blood sugar is reading over 600 on our machine.  Go to the ER for further evaluation and treatment, concern for DKA    ED Prescriptions    None     PDMP not reviewed this encounter.   Faustino Congress, NP 09/15/20 1053

## 2020-09-15 NOTE — ED Triage Notes (Signed)
Pt presents with excessive gas after drinking something and urinary frequency X 3 weeks.

## 2020-09-15 NOTE — H&P (Addendum)
History and Physical    Riley Hood GYK:[id number] DOB: March 21, 1966 DOA: 09/15/2020  PCP: Vevelyn Francois, NP (Confirm with patient/family/NH records and if not entered, this has to be entered at [facility] point of entry) Patient coming from: [facility]  I have personally briefly reviewed patient's old medical records in [place]  Chief Complaint: Urinary frequency  HPI: Riley Hood is a 55 y.o. male with medical history significant of IDDM, HTN, HLD presented with urinary frequency, generalized weakness.  Patient was diagnosed with IDDM February 2021, was supposed to take a combined regimen of Lantus 34 units twice daily, and NovoLog 5 units 3 times daily AC.  Patient has poor follow-ups, and at some point appears that he mixed up his insulin regimen, and he stopped taking the NovoLog 5 units 3 times daily AC for few months and for some reason he also cut down his Lantus to 5 units twice daily for the last 3 to 4 months (probably to save his supply but cannot confirm with patient), and his fingerstick check almost always >500.  Number 3 weeks he has been feeling increasing generalized weakness, frequent urination and thirsty.  And he went to ER today and found glucose more than 700.  Denied any nauseous vomiting diarrhea abdominal pain no chest pains.  No fever chills. ED Course: Creatinine 1.7, potassium 6.6 bicarb 20.  VBG showed combined metabolic acidosis and respiratory alkalosis.  EKG showed tented T waves, received below, and insulin drip started.  Review of Systems: As per HPI otherwise 14 point review of systems negative.    Past Medical History:  Diagnosis Date  . Hypertension     History reviewed. No pertinent surgical history.   reports that he has never smoked. He has never used smokeless tobacco. He reports current alcohol use. He reports that he does not use drugs.  No Known Allergies  Family History  Family history unknown: Yes     Prior to Admission  medications   Medication Sig Start Date End Date Taking? Authorizing Provider  aspirin 81 MG EC tablet Take 1 tablet (81 mg total) by mouth daily. 08/20/19   Hosie Poisson, MD  atorvastatin (LIPITOR) 40 MG tablet Take 1 tablet (40 mg total) by mouth daily at 6 PM. 08/20/19   Hosie Poisson, MD  blood glucose meter kit and supplies KIT Dispense based on patient and insurance preference. Use up to four times daily as directed. (FOR ICD-9 250.00, 250.01). 08/20/19   Hosie Poisson, MD  insulin aspart (NOVOLOG) 100 UNIT/ML injection Inject 5 Units into the skin 3 (three) times daily with meals. 08/20/19   Hosie Poisson, MD  insulin glargine (LANTUS) 100 UNIT/ML injection Inject 0.34 mLs (twice daily total) into the skin 2 (two) times daily. 08/20/19   Hosie Poisson, MD  metoprolol tartrate (LOPRESSOR) 50 MG tablet Take 1 tablet (50 mg total) by mouth 2 (two) times daily. 08/20/19   Hosie Poisson, MD  polyethylene glycol (MIRALAX / GLYCOLAX) 17 g packet Take 17 g by mouth daily. Patient not taking: Reported on 10/07/2019 08/21/19   Hosie Poisson, MD  senna-docusate (SENOKOT-S) 8.6-50 MG tablet Take 2 tablets by mouth 2 (two) times daily. Patient not taking: Reported on 10/07/2019 08/20/19   Hosie Poisson, MD  vitamin B-12 (CYANOCOBALAMIN) 250 MCG tablet Take 250 mcg by mouth daily.    [provider]  VITAMIN D PO Take 1 tablet by mouth daily.    [provider]  VITAMIN E PO Take  1 capsule by mouth daily.    [provider]    Physical Exam: Vitals:   09/15/20 1730 09/15/20 1745 09/15/20 1800 09/15/20 1815  BP: 109/84  (!) 152/89 (!) 133/96  Pulse: (!) 111 (!) 116 (!) 111 (!) 114  Resp: $Remo'19 17 14 11  'eSAzy$ Temp:      TempSrc:      SpO2: 95% 99% 98% 100%  Weight:        Constitutional: NAD, calm, comfortable Vitals:   09/15/20 1730 09/15/20 1745 09/15/20 1800 09/15/20 1815  BP: 109/84  (!) 152/89 (!) 133/96  Pulse: (!) 111 (!) 116 (!) 111 (!) 114  Resp: $Remo'19 17 14 11  'lUAGG$ Temp:      TempSrc:       SpO2: 95% 99% 98% 100%  Weight:       Eyes: PERRL, lids and conjunctivae normal ENMT: Mucous membranes are dry. Posterior pharynx clear of any exudate or lesions.Normal dentition.  Neck: normal, supple, no masses, no thyromegaly Respiratory: clear to auscultation bilaterally, no wheezing, no crackles. Normal respiratory effort. No accessory muscle use.  Cardiovascular: Regular rate and rhythm, no murmurs / rubs / gallops. No extremity edema. 2+ pedal pulses. No carotid bruits.  Abdomen: no tenderness, no masses palpated. No hepatosplenomegaly. Bowel sounds positive.  Musculoskeletal: no clubbing / cyanosis. No joint deformity upper and lower extremities. Good ROM, no contractures. Normal muscle tone.  Skin: no rashes, lesions, ulcers. No induration Neurologic: CN 2-12 grossly intact. Sensation intact, DTR normal. Strength 5/5 in all 4.  Psychiatric: Normal judgment and insight. Alert and oriented x 3. Normal mood.     Labs on Admission: I have personally reviewed following labs and imaging studies  CBC: Recent Labs  Lab 09/15/20 1203 09/15/20 1442  WBC 10.5  --   HGB 16.2 17.0  HCT 45.4 50.0  MCV 72.9*  --   PLT 261  --    Basic Metabolic Panel: Recent Labs  Lab 09/15/20 1203 09/15/20 1442  NA 119* 119*  K 6.6* 6.7*  CL 81*  --   CO2 20*  --   GLUCOSE 771*  --   BUN 37*  --   CREATININE 1.73*  --   CALCIUM 11.1*  --    GFR: CrCl cannot be calculated (Unknown ideal weight.). Liver Function Tests: Recent Labs  Lab 09/15/20 1430  AST 18  ALT 20  ALKPHOS 84  BILITOT 1.7*  PROT 8.3*  ALBUMIN 3.8   No results for input(s): LIPASE, AMYLASE in the last 168 hours. No results for input(s): AMMONIA in the last 168 hours. Coagulation Profile: No results for input(s): INR, PROTIME in the last 168 hours. Cardiac Enzymes: No results for input(s): CKTOTAL, CKMB, CKMBINDEX, TROPONINI in the last 168 hours. BNP (last 3 results) No results for input(s): PROBNP in the  last 8760 hours. HbA1C: No results for input(s): HGBA1C in the last 72 hours. CBG: Recent Labs  Lab 09/15/20 1521 09/15/20 1552 09/15/20 1645 09/15/20 1717 09/15/20 1814  GLUCAP 596* 574* 492* 388* 338*   Lipid Profile: No results for input(s): CHOL, HDL, LDLCALC, TRIG, CHOLHDL, LDLDIRECT in the last 72 hours. Thyroid Function Tests: No results for input(s): TSH, T4TOTAL, FREET4, T3FREE, THYROIDAB in the last 72 hours. Anemia Panel: No results for input(s): VITAMINB12, FOLATE, FERRITIN, TIBC, IRON, RETICCTPCT in the last 72 hours. Urine analysis:    Component Value Date/Time   COLORURINE STRAW (A) 09/15/2020 1203   APPEARANCEUR CLEAR 09/15/2020 1203   LABSPEC 1.024 09/15/2020  [place] 5.0 09/15/2020 1203   GLUCOSEU >=500 (A) 09/15/2020 1203   HGBUR NEGATIVE 09/15/2020 [place] 09/15/2020 1203   BILIRUBINUR neg 10/07/2019 0951   KETONESUR 20 (A) 09/15/2020 1203   PROTEINUR NEGATIVE 09/15/2020 1203   UROBILINOGEN 0.2 09/15/2020 1032   NITRITE NEGATIVE 09/15/2020 1203   LEUKOCYTESUR NEGATIVE 09/15/2020 1203    Radiological Exams on Admission: DG Chest Portable 1 View  Result Date: 09/15/2020 CLINICAL DATA:  Cough. EXAM: PORTABLE CHEST 1 VIEW COMPARISON:  May 25, 2015. FINDINGS: The heart size and mediastinal contours are within normal limits. Both lungs are clear. The visualized skeletal structures are unremarkable. IMPRESSION: No active disease. Electronically Signed   By: Marijo Conception M.D.   On: 09/15/2020 15:22    EKG: Independently reviewed. Tented T wave  Assessment/Plan Active Problems:   DKA (diabetic ketoacidosis) ([place])  (please populate well all problems here in Problem List. (For example, if patient is on BP meds at home and you resume or decide to hold them, it is a problem that needs to be her. Same for CAD, COPD, HLD and so on)  DKA with combined respiratory alkalosis -Noncompliant with insulin regimen -Received a total of  2.7 L of IV bolus, still appears to be dry, will order 1 more liters of IV bolus and continue high rate of IV fluid. -Insulin drip  Hyperkalemia -Received cockatoo and Lokelma -BMP Q4hours  AKI -Secondary from dehydration -IVF bolus plus maintenance IV fluid  Hypercalcemia -Secondary to AKI dehydration -Hydrate and reevaluate.  HTN -Continue metoprolol  HLD -Statin  DVT prophylaxis: Heparin subcu Code Status: Full code Family Communication: None at bedside Disposition Plan: *Expect 1 to 2 days hospital stay to treat DKA Consults called: None Admission status: PCU   Lequita Halt MD [facility] Pager [phone]  09/15/2020, 6:23 PM

## 2020-09-15 NOTE — ED Notes (Signed)
EKG given to Dr Sherry Ruffing, [place]; Pt currently denies chest pain. Will continue to monitor.

## 2020-09-16 ENCOUNTER — Encounter (HOSPITAL_COMMUNITY): Payer: Self-pay | Admitting: Internal Medicine

## 2020-09-16 DIAGNOSIS — G629 Polyneuropathy, unspecified: Secondary | ICD-10-CM

## 2020-09-16 DIAGNOSIS — E785 Hyperlipidemia, unspecified: Secondary | ICD-10-CM

## 2020-09-16 DIAGNOSIS — E1110 Type 2 diabetes mellitus with ketoacidosis without coma: Principal | ICD-10-CM

## 2020-09-16 DIAGNOSIS — E1169 Type 2 diabetes mellitus with other specified complication: Secondary | ICD-10-CM

## 2020-09-16 LAB — BETA-HYDROXYBUTYRIC ACID
Beta-Hydroxybutyric Acid: 0.37 mmol/L — ABNORMAL HIGH (ref 0.05–0.27)
Beta-Hydroxybutyric Acid: 0.94 mmol/L — ABNORMAL HIGH (ref 0.05–0.27)

## 2020-09-16 LAB — BASIC METABOLIC PANEL
Anion gap: 14 (ref 5–15)
BUN: 23 mg/dL — ABNORMAL HIGH (ref 6–20)
CO2: 25 mmol/L (ref 22–32)
Calcium: 10.1 mg/dL (ref 8.9–10.3)
Chloride: 93 mmol/L — ABNORMAL LOW (ref 98–111)
Creatinine, Ser: 0.94 mg/dL (ref 0.61–1.24)
GFR, Estimated: >60 mL/min (ref >60)
Glucose, Bld: 272 mg/dL — ABNORMAL HIGH (ref 70–99)
Potassium: 3.8 mmol/L (ref 3.5–5.1)
Sodium: 132 mmol/L — ABNORMAL LOW (ref 135–145)

## 2020-09-16 LAB — GLUCOSE, CAPILLARY
Glucose-Capillary: 301 mg/dL — ABNORMAL HIGH (ref 70–99)
Glucose-Capillary: 281 mg/dL — ABNORMAL HIGH (ref 70–99)
Glucose-Capillary: 197 mg/dL — ABNORMAL HIGH (ref 70–99)
Glucose-Capillary: 224 mg/dL — ABNORMAL HIGH (ref 70–99)
Glucose-Capillary: 257 mg/dL — ABNORMAL HIGH (ref 70–99)
Glucose-Capillary: 266 mg/dL — ABNORMAL HIGH (ref 70–99)
Glucose-Capillary: 275 mg/dL — ABNORMAL HIGH (ref 70–99)
Glucose-Capillary: 284 mg/dL — ABNORMAL HIGH (ref 70–99)
Glucose-Capillary: 264 mg/dL — ABNORMAL HIGH (ref 70–99)
Glucose-Capillary: 309 mg/dL — ABNORMAL HIGH (ref 70–99)
Glucose-Capillary: 455 mg/dL — ABNORMAL HIGH (ref 70–99)
Glucose-Capillary: 316 mg/dL — ABNORMAL HIGH (ref 70–99)

## 2020-09-16 LAB — URINE CULTURE: Culture: NO GROWTH

## 2020-09-16 MED ORDER — INSULIN GLARGINE 100 UNIT/ML ~~LOC~~ SOLN
34.0000 [IU] | Freq: Two times a day (BID) | SUBCUTANEOUS | Status: DC
  Administered 2020-09-16 (×2): 34 [IU] via SUBCUTANEOUS
  Filled 2020-09-16 (×3): qty 0.34

## 2020-09-16 MED ORDER — INSULIN ASPART 100 UNIT/ML ~~LOC~~ SOLN
0.0000 [IU] | Freq: Every day | SUBCUTANEOUS | Status: DC
  Administered 2020-09-16: 3 [IU] via SUBCUTANEOUS
  Administered 2020-09-16: 5 [IU] via SUBCUTANEOUS

## 2020-09-16 MED ORDER — INSULIN ASPART PROT & ASPART (70-30 MIX) 100 UNIT/ML ~~LOC~~ SUSP
20.0000 [IU] | Freq: Two times a day (BID) | SUBCUTANEOUS | Status: DC
  Administered 2020-09-16 – 2020-09-17 (×2): 20 [IU] via SUBCUTANEOUS
  Filled 2020-09-16: qty 10

## 2020-09-16 MED ORDER — GABAPENTIN 400 MG PO CAPS
400.0000 mg | ORAL_CAPSULE | Freq: Three times a day (TID) | ORAL | Status: DC
  Administered 2020-09-16 – 2020-09-17 (×4): 400 mg via ORAL
  Filled 2020-09-16 (×4): qty 1

## 2020-09-16 MED ORDER — INSULIN ASPART 100 UNIT/ML ~~LOC~~ SOLN
0.0000 [IU] | Freq: Three times a day (TID) | SUBCUTANEOUS | Status: DC
  Administered 2020-09-16 (×2): 11 [IU] via SUBCUTANEOUS
  Administered 2020-09-16 – 2020-09-17 (×2): 15 [IU] via SUBCUTANEOUS

## 2020-09-16 NOTE — Progress Notes (Signed)
PROGRESS NOTE    Riley Hood   QAS:[id number]  DOB: 1966/04/19  DOA: 09/15/2020     1  PCP: Vevelyn Francois, NP  CC: weakness  Hospital Course: Riley Hood is a 55 year old male with PMH insulin-dependent diabetes, hypertension, hyperlipidemia who presented to the hospital with increased urinary frequency and generalized weakness.  He was found to be in DKA.  He has difficulty affording insulins outpatient and has been getting free samples from his PCP office for approximately 1 year. He was treated with DKA protocol, responded well, and was able to be transitioned off of insulin drip.   Interval History:  Feeling much better this am and very interested in starting a diet. Denies N/V.  We discussed transitioning insulins and trialing gabapentin due to complaints of what sounds like LE neuropathic pains   ROS: Constitutional: negative for chills and fevers, Respiratory: negative for cough, Cardiovascular: negative for chest pain and Gastrointestinal: negative for abdominal pain  Assessment & Plan: * DKA (diabetic ketoacidosis) (HCC)-resolved as of 09/16/2020 -Due to financial constraints affording insulins as patient is uninsured and getting insulin samples from PCP -Responded well to insulin drip and have transitioned off  Type 2 diabetes mellitus with other specified complication (HCC) -N9G 92.1% -Patient endorses difficulty affording insulins outpatient; uninsured -Diabetes coordinator consulted -Change Lantus to 70/30 and monitor glucoses overnight to adjust further if needed - continue SSI  Peripheral neuropathy -Patient endorsing burning and electrical pain sensation in lower extremities -Trial of gabapentin and monitor response  Hyperlipidemia -Continue Lipitor  Hypertension -Continue metoprolol  Hypercalcemia-resolved as of 09/16/2020 -Was considered due to dehydration -Responded well to fluids  AKI (acute kidney injury) (HCC)-resolved as of 09/16/2020 -  baseline creatinine ~ 0.8 - 1 - patient presents with increase in creat >0.3 mg/dL above baseline, creat increase >1.5x baseline presumed to have occurred within past 7 days PTA - creat 1.73 on admission; likely pre-renal  - responded well to IVF   Hyperkalemia-resolved as of 09/16/2020 - s/p treatment on admission    Old records reviewed in assessment of this patient  Antimicrobials: N/a  DVT prophylaxis: heparin injection 5,000 Units Start: 09/15/20 2200   Code Status:   Code Status: Full Code Family Communication: none present  Disposition Plan: Status is: Inpatient  Remains inpatient appropriate because:IV treatments appropriate due to intensity of illness or inability to take PO and Inpatient level of care appropriate due to severity of illness   Dispo: The patient is from: Home              Anticipated d/c is to: Home              Patient currently is not medically stable to d/c.   Difficult to place patient No   Risk of unplanned readmission score: Unplanned Admission- Pilot do not use: 11.1   Objective: Blood pressure 111/80, pulse 91, temperature 98.7 F (37.1 C), temperature source Oral, resp. rate 16, height 6' (1.829 m), weight 130.2 kg, SpO2 98 %.  Examination: General appearance: alert, cooperative and no distress Head: Normocephalic, without obvious abnormality, atraumatic Eyes: EOMI Lungs: clear to auscultation bilaterally Heart: regular rate and rhythm and S1, S2 normal Abdomen: normal findings: bowel sounds normal and soft, non-tender Extremities: no edema; Left left noted with prior skin grafting and irregular shape of soleus muscle Skin: mobility and turgor normal Neurologic: Grossly normal  Consultants:   n/a  Procedures:   n/a  Data Reviewed: I have personally reviewed following labs and  imaging studies Results for orders placed or performed during the hospital encounter of 09/15/20 (from the past 24 hour(s))  Beta-hydroxybutyric acid      Status: Abnormal   Collection Time: 09/15/20  2:30 PM  Result Value Ref Range   Beta-Hydroxybutyric Acid 3.88 (H) 0.05 - 0.27 mmol/L  Hepatic function panel     Status: Abnormal   Collection Time: 09/15/20  2:30 PM  Result Value Ref Range   Total Protein 8.3 (H) 6.5 - 8.1 g/dL   Albumin 3.8 3.5 - 5.0 g/dL   AST 18 15 - 41 U/L   ALT 20 0 - 44 U/L   Alkaline Phosphatase 84 38 - 126 U/L   Total Bilirubin 1.7 (H) 0.3 - 1.2 mg/dL   Bilirubin, Direct 0.1 0.0 - 0.2 mg/dL   Indirect Bilirubin 1.6 (H) 0.3 - 0.9 mg/dL  SARS CORONAVIRUS 2 (TAT 6-24 HRS) Nasopharyngeal Nasopharyngeal Swab     Status: None   Collection Time: 09/15/20  2:34 PM   Specimen: Nasopharyngeal Swab  Result Value Ref Range   SARS Coronavirus 2 NEGATIVE NEGATIVE  I-Stat venous blood gas, ED     Status: Abnormal   Collection Time: 09/15/20  2:42 PM  Result Value Ref Range   pH, Ven 7.440 (H) 7.250 - 7.430   pCO2, Ven 34.4 (L) 44.0 - 60.0 mmHg   pO2, Ven 80.0 (H) 32.0 - 45.0 mmHg   Bicarbonate 23.4 20.0 - 28.0 mmol/L   TCO2 24 22 - 32 mmol/L   O2 Saturation 96.0 %   Acid-Base Excess 0.0 0.0 - 2.0 mmol/L   Sodium 119 (LL) 135 - 145 mmol/L   Potassium 6.7 (HH) 3.5 - 5.1 mmol/L   Calcium, Ion 1.27 1.15 - 1.40 mmol/L   HCT 50.0 39.0 - 52.0 %   Hemoglobin 17.0 13.0 - 17.0 g/dL   Sample type VENOUS    Comment NOTIFIED PHYSICIAN   CBG monitoring, ED     Status: Abnormal   Collection Time: 09/15/20  3:21 PM  Result Value Ref Range   Glucose-Capillary 596 (HH) 70 - 99 mg/dL  CBG monitoring, ED     Status: Abnormal   Collection Time: 09/15/20  3:52 PM  Result Value Ref Range   Glucose-Capillary 574 (HH) 70 - 99 mg/dL  CBG monitoring, ED     Status: Abnormal   Collection Time: 09/15/20  4:45 PM  Result Value Ref Range   Glucose-Capillary 492 (H) 70 - 99 mg/dL  CBG monitoring, ED     Status: Abnormal   Collection Time: 09/15/20  5:17 PM  Result Value Ref Range   Glucose-Capillary 388 (H) 70 - 99 mg/dL  CBG  monitoring, ED     Status: Abnormal   Collection Time: 09/15/20  6:14 PM  Result Value Ref Range   Glucose-Capillary 338 (H) 70 - 99 mg/dL  CBG monitoring, ED     Status: Abnormal   Collection Time: 09/15/20  7:22 PM  Result Value Ref Range   Glucose-Capillary 283 (H) 70 - 99 mg/dL  CBG monitoring, ED     Status: Abnormal   Collection Time: 09/15/20  8:35 PM  Result Value Ref Range   Glucose-Capillary 290 (H) 70 - 99 mg/dL  CBG monitoring, ED     Status: Abnormal   Collection Time: 09/15/20  9:29 PM  Result Value Ref Range   Glucose-Capillary 264 (H) 70 - 99 mg/dL  Beta-hydroxybutyric acid     Status: Abnormal   Collection Time: 09/15/20  9:46 PM  Result Value Ref Range   Beta-Hydroxybutyric Acid 0.37 (H) 0.05 - 0.27 mmol/L  HIV Antibody (routine testing w rflx)     Status: None   Collection Time: 09/15/20  9:46 PM  Result Value Ref Range   HIV Screen 4th Generation wRfx Non Reactive Non Reactive  Basic metabolic panel     Status: Abnormal   Collection Time: 09/15/20  9:46 PM  Result Value Ref Range   Sodium 131 (L) 135 - 145 mmol/L   Potassium 3.9 3.5 - 5.1 mmol/L   Chloride 94 (L) 98 - 111 mmol/L   CO2 27 22 - 32 mmol/L   Glucose, Bld 244 (H) 70 - 99 mg/dL   BUN 27 (H) 6 - 20 mg/dL   Creatinine, Ser 1.02 0.61 - 1.24 mg/dL   Calcium 10.5 (H) 8.9 - 10.3 mg/dL   GFR, Estimated >60 >60 mL/min   Anion gap 10 5 - 15  Hemoglobin A1c     Status: Abnormal   Collection Time: 09/15/20  9:46 PM  Result Value Ref Range   Hgb A1c MFr Bld 13.1 (H) 4.8 - 5.6 %   Mean Plasma Glucose 329.27 mg/dL  Blood gas, venous     Status: Abnormal   Collection Time: 09/15/20 10:58 PM  Result Value Ref Range   FIO2 21.00    pH, Ven 7.433 (H) 7.250 - 7.430   pCO2, Ven 46.8 44.0 - 60.0 mmHg   pO2, Ven 61.1 (H) 32.0 - 45.0 mmHg   Bicarbonate 30.8 (H) 20.0 - 28.0 mmol/L   Acid-Base Excess 6.5 (H) 0.0 - 2.0 mmol/L   O2 Saturation 90.1 %   Patient temperature 37.0    Collection site NO ONE WOULD  PICK UP PHONE THAT KNEW PT INFO    Sample type VENOUS   Glucose, capillary     Status: Abnormal   Collection Time: 09/15/20 11:02 PM  Result Value Ref Range   Glucose-Capillary 197 (H) 70 - 99 mg/dL  Glucose, capillary     Status: Abnormal   Collection Time: 09/16/20 12:04 AM  Result Value Ref Range   Glucose-Capillary 224 (H) 70 - 99 mg/dL  Glucose, capillary     Status: Abnormal   Collection Time: 09/16/20 12:59 AM  Result Value Ref Range   Glucose-Capillary 257 (H) 70 - 99 mg/dL  Glucose, capillary     Status: Abnormal   Collection Time: 09/16/20  1:53 AM  Result Value Ref Range   Glucose-Capillary 266 (H) 70 - 99 mg/dL  Beta-hydroxybutyric acid     Status: Abnormal   Collection Time: 09/16/20  2:33 AM  Result Value Ref Range   Beta-Hydroxybutyric Acid 0.94 (H) 0.05 - 0.27 mmol/L  Basic metabolic panel     Status: Abnormal   Collection Time: 09/16/20  2:33 AM  Result Value Ref Range   Sodium 132 (L) 135 - 145 mmol/L   Potassium 3.8 3.5 - 5.1 mmol/L   Chloride 93 (L) 98 - 111 mmol/L   CO2 25 22 - 32 mmol/L   Glucose, Bld 272 (H) 70 - 99 mg/dL   BUN 23 (H) 6 - 20 mg/dL   Creatinine, Ser 0.94 0.61 - 1.24 mg/dL   Calcium 10.1 8.9 - 10.3 mg/dL   GFR, Estimated >60 >60 mL/min   Anion gap 14 5 - 15  Glucose, capillary     Status: Abnormal   Collection Time: 09/16/20  3:17 AM  Result Value Ref Range   Glucose-Capillary 284 (H)  70 - 99 mg/dL  Glucose, capillary     Status: Abnormal   Collection Time: 09/16/20  3:42 AM  Result Value Ref Range   Glucose-Capillary 301 (H) 70 - 99 mg/dL  Glucose, capillary     Status: Abnormal   Collection Time: 09/16/20  5:55 AM  Result Value Ref Range   Glucose-Capillary 281 (H) 70 - 99 mg/dL  Glucose, capillary     Status: Abnormal   Collection Time: 09/16/20  7:58 AM  Result Value Ref Range   Glucose-Capillary 275 (H) 70 - 99 mg/dL  Glucose, capillary     Status: Abnormal   Collection Time: 09/16/20 12:18 PM  Result Value Ref Range    Glucose-Capillary 264 (H) 70 - 99 mg/dL    Recent Results (from the past 240 hour(s))  Urine culture     Status: None   Collection Time: 09/15/20 12:35 PM   Specimen: Urine, Random  Result Value Ref Range Status   Specimen Description URINE, RANDOM  Final   Special Requests NONE  Final   Culture   Final    NO GROWTH Performed at [facility], [place] [address] [place], [place] [address]    Report Status 09/16/2020 FINAL  Final  SARS CORONAVIRUS 2 (TAT 6-24 HRS) Nasopharyngeal Nasopharyngeal Swab     Status: None   Collection Time: 09/15/20  2:34 PM   Specimen: Nasopharyngeal Swab  Result Value Ref Range Status   SARS Coronavirus 2 NEGATIVE NEGATIVE Final    Comment: (NOTE) SARS-CoV-2 target nucleic acids are NOT DETECTED.  The SARS-CoV-2 RNA is generally detectable in upper and lower respiratory specimens during the acute phase of infection. Negative results do not preclude SARS-CoV-2 infection, do not rule out co-infections with other pathogens, and should not be used as the sole basis for treatment or other patient management decisions. Negative results must be combined with clinical observations, patient history, and epidemiological information. The expected result is Negative.  Fact Sheet for Patients: SugarRoll.be  Fact Sheet for Healthcare Providers: https://www.woods-mathews.com/  This test is not yet approved or cleared by the [place] FDA and  has been authorized for detection and/or diagnosis of SARS-CoV-2 by FDA under an Emergency Use Authorization (EUA). This EUA will remain  in effect (meaning this test can be used) for the duration of the COVID-19 declaration under Se ction 564(b)(1) of the Act, 21 U.S.C. section 360bbb-3(b)(1), unless the authorization is terminated or revoked sooner.  Performed at [facility], [place] [address], [place], [place] [address]      Radiology Studies: DG Chest Portable 1  View  Result Date: 09/15/2020 CLINICAL DATA:  Cough. EXAM: PORTABLE CHEST 1 VIEW COMPARISON:  May 25, 2015. FINDINGS: The heart size and mediastinal contours are within normal limits. Both lungs are clear. The visualized skeletal structures are unremarkable. IMPRESSION: No active disease. Electronically Signed   By: Marijo Conception M.D.   On: 09/15/2020 15:22   DG Chest Portable 1 View  Final Result      Scheduled Meds: . aspirin EC  81 mg Oral Daily  . atorvastatin  40 mg Oral q1800  . gabapentin  400 mg Oral TID  . heparin  5,000 Units Subcutaneous Q8H  . insulin aspart  0-20 Units Subcutaneous TID WC  . insulin aspart  0-5 Units Subcutaneous QHS  . insulin aspart protamine- aspart  20 Units Subcutaneous BID WC  . metoprolol tartrate  50 mg Oral BID  . vitamin B-12  250 mcg Oral Daily   PRN Meds: dextrose Continuous Infusions:   LOS: 1 day  Time spent: Greater than 50% of the 35 minute visit was spent in counseling/coordination of care for the patient as laid out in the A&P.   Dwyane Dee, MD [facility] 09/16/2020, 2:14 PM

## 2020-09-16 NOTE — Assessment & Plan Note (Signed)
-  Continue Lipitor °

## 2020-09-16 NOTE — Assessment & Plan Note (Addendum)
-  A1c 13.1% -Patient endorses difficulty affording insulins outpatient; uninsured -Diabetes coordinator consulted -Changed Lantus to 70/30 and monitor glucoses overnight to adjust further if needed: Discharged on 30 units twice daily and a sliding scale regimen with short acting

## 2020-09-16 NOTE — Assessment & Plan Note (Signed)
-  Was considered due to dehydration -Responded well to fluids

## 2020-09-16 NOTE — Assessment & Plan Note (Signed)
-   baseline creatinine ~ 0.8 - 1 - patient presents with increase in creat >0.3 mg/dL above baseline, creat increase >1.5x baseline presumed to have occurred within past 7 days PTA - creat 1.73 on admission; likely pre-renal  - responded well to IVF

## 2020-09-16 NOTE — Progress Notes (Signed)
Inpatient Diabetes Program Recommendations  AACE/ADA: New Consensus Statement on Inpatient Glycemic Control (2015)  Target Ranges:  Prepandial:   less than 140 mg/dL      Peak postprandial:   less than 180 mg/dL (1-2 hours)      Critically ill patients:  140 - 180 mg/dL   Lab Results  Component Value Date   GLUCAP 264 (H) 09/16/2020   HGBA1C 13.1 (H) 09/15/2020    Review of Glycemic Control Results for Riley Hood, Riley Hood (MRN [id number]) as of 09/16/2020 14:25  Ref. Range 09/16/2020 05:55 09/16/2020 07:58 09/16/2020 12:18  Glucose-Capillary Latest Ref Range: 70 - 99 mg/dL 281 (H) 275 (H) 264 (H)   Diabetes history: Type 2 DM Outpatient Diabetes medications: Lantus 34 units BID, Novolog 5 units TID Current orders for Inpatient glycemic control: Lantus 35 units BID, Novolog 0-20 units TID, Novolog 0-5 units QHS  Inpatient Diabetes Program Recommendations:    Spoke with patient regarding outpatient diabetes management. New diagnoses in January 2022 and DM coordinator spoke with patient on 08/17/20. Patient reports that he has insulin and supplies, however, he became confused on dosages after calling and talking with someone from his doctor's office. Has been taking far less doses of insulin than prescribed. Reviewed patient's current A1c of 13.1% which was up from 11.8%. Explained what a A1c is and what it measures. Also reviewed goal A1c with patient, importance of good glucose control @ home, and blood sugar goals. Reviewed at length the importance of good glycemic control, DKA, role of pancreas, signs and symptoms of hypo vs hyper glycemia, interventions, vascular changes and commorbidities.  Patient has been being followed by [facility] for diabetes and has an appointment in April; gets meds from clinic. Education provided to patient on dosages, when to take, how to reference these dosages, when to call MD, and how to send messages through the portal in the event he cannot get an earlier  appointment.  Patient expresses understanding and has no further questions.   Thanks, Riley Curb, MSN, RNC-OB Diabetes Coordinator [phone] (8a-5p)

## 2020-09-16 NOTE — Hospital Course (Addendum)
Riley Hood is a 55 year old male with PMH insulin-dependent diabetes, hypertension, hyperlipidemia who presented to the hospital with increased urinary frequency and generalized weakness.  He was found to be in DKA.  He has difficulty affording insulins outpatient and has been getting free samples from his PCP office for approximately 1 year. He was treated with DKA protocol, responded well, and was able to be transitioned off of insulin drip.  He met with diabetes coordinator as well for further education prior to discharge.  Insulins were transitioned to NovoLog 70/30 and sliding scale insulin.  [facility] as [name] was still unaffordable.

## 2020-09-16 NOTE — Assessment & Plan Note (Signed)
-   s/p treatment on admission

## 2020-09-16 NOTE — Assessment & Plan Note (Signed)
Continue metoprolol. 

## 2020-09-16 NOTE — Assessment & Plan Note (Signed)
-  Due to financial constraints affording insulins as patient is uninsured and getting insulin samples from PCP -Responded well to insulin drip and have transitioned off

## 2020-09-16 NOTE — Plan of Care (Signed)
  Problem: Education: Goal: Knowledge of General Education information will improve Description: Including pain rating scale, medication(s)/side effects and non-pharmacologic comfort measures 09/16/2020 0137 by Rosemary Holms, RN Outcome: Progressing 09/16/2020 0136 by Rosemary Holms, RN Outcome: Progressing   Problem: Clinical Measurements: Goal: Respiratory complications will improve 09/16/2020 0137 by Rosemary Holms, RN Outcome: Progressing 09/16/2020 0136 by Rosemary Holms, RN Outcome: Progressing   Problem: Clinical Measurements: Goal: Cardiovascular complication will be avoided 09/16/2020 0137 by Rosemary Holms, RN Outcome: Progressing 09/16/2020 0136 by Rosemary Holms, RN Outcome: Progressing   Problem: Elimination: Goal: Will not experience complications related to bowel motility 09/16/2020 0137 by Rosemary Holms, RN Outcome: Progressing 09/16/2020 0136 by Rosemary Holms, RN Outcome: Progressing Goal: Will not experience complications related to urinary retention 09/16/2020 0137 by Rosemary Holms, RN Outcome: Progressing 09/16/2020 0136 by Rosemary Holms, RN Outcome: Progressing   Problem: Education: Goal: Ability to describe self-care measures that may prevent or decrease complications (Diabetes Survival Skills Education) will improve 09/16/2020 0137 by Rosemary Holms, RN Outcome: Progressing 09/16/2020 0136 by Rosemary Holms, RN Outcome: Progressing Goal: Individualized Educational Video(s) 09/16/2020 0137 by Rosemary Holms, RN Outcome: Progressing 09/16/2020 0136 by Rosemary Holms, RN Outcome: Progressing   Problem: Metabolic: Goal: Ability to maintain appropriate glucose levels will improve 09/16/2020 0137 by Rosemary Holms, RN Outcome: Progressing 09/16/2020 0136 by Rosemary Holms, RN Outcome: Progressing   Problem: Urinary Elimination: Goal: Ability to achieve and maintain adequate renal perfusion and functioning will improve 09/16/2020 0137 by Rosemary Holms, RN Outcome: Progressing 09/16/2020  0136 by Rosemary Holms, RN Outcome: Progressing

## 2020-09-16 NOTE — Assessment & Plan Note (Signed)
-  Patient endorsing burning and electrical pain sensation in lower extremities -Trial of gabapentin and monitor response; prescription provided at discharge

## 2020-09-17 ENCOUNTER — Other Ambulatory Visit (HOSPITAL_COMMUNITY): Payer: Self-pay | Admitting: Internal Medicine

## 2020-09-17 DIAGNOSIS — N179 Acute kidney failure, unspecified: Secondary | ICD-10-CM

## 2020-09-17 LAB — CBC WITH DIFFERENTIAL/PLATELET
Abs Immature Granulocytes: 0.01 10*3/uL (ref 0.00–0.07)
Basophils Absolute: 0.1 10*3/uL (ref 0.0–0.1)
Basophils Relative: 1 %
Eosinophils Absolute: 0 10*3/uL (ref 0.0–0.5)
Eosinophils Relative: 1 %
HCT: 42.4 % (ref 39.0–52.0)
Hemoglobin: 14.6 g/dL (ref 13.0–17.0)
Immature Granulocytes: 0 %
Lymphocytes Relative: 45 %
Lymphs Abs: 3.2 10*3/uL (ref 0.7–4.0)
MCH: 25.6 pg — ABNORMAL LOW (ref 26.0–34.0)
MCHC: 34.4 g/dL (ref 30.0–36.0)
MCV: 74.4 fL — ABNORMAL LOW (ref 80.0–100.0)
Monocytes Absolute: 0.6 10*3/uL (ref 0.1–1.0)
Monocytes Relative: 9 %
Neutro Abs: 3.1 10*3/uL (ref 1.7–7.7)
Neutrophils Relative %: 44 %
Platelets: 184 10*3/uL (ref 150–400)
RBC: 5.7 MIL/uL (ref 4.22–5.81)
RDW: 14 % (ref 11.5–15.5)
WBC: 7 10*3/uL (ref 4.0–10.5)
nRBC: 0 % (ref 0.0–0.2)

## 2020-09-17 LAB — BASIC METABOLIC PANEL
Anion gap: 10 (ref 5–15)
BUN: 15 mg/dL (ref 6–20)
CO2: 26 mmol/L (ref 22–32)
Calcium: 9.7 mg/dL (ref 8.9–10.3)
Chloride: 96 mmol/L — ABNORMAL LOW (ref 98–111)
Creatinine, Ser: 0.91 mg/dL (ref 0.61–1.24)
GFR, Estimated: >60 mL/min (ref >60)
Glucose, Bld: 190 mg/dL — ABNORMAL HIGH (ref 70–99)
Potassium: 3.6 mmol/L (ref 3.5–5.1)
Sodium: 132 mmol/L — ABNORMAL LOW (ref 135–145)

## 2020-09-17 LAB — GLUCOSE, CAPILLARY: Glucose-Capillary: 301 mg/dL — ABNORMAL HIGH (ref 70–99)

## 2020-09-17 LAB — MAGNESIUM: Magnesium: 1.7 mg/dL (ref 1.7–2.4)

## 2020-09-17 MED ORDER — ASPIRIN 81 MG PO TBEC: 81.0000 mg | DELAYED_RELEASE_TABLET | Freq: Every day | ORAL | 11 refills | Status: DC

## 2020-09-17 MED ORDER — METOPROLOL TARTRATE 50 MG PO TABS: 50.0000 mg | ORAL_TABLET | Freq: Two times a day (BID) | ORAL | 3 refills | Status: DC

## 2020-09-17 MED ORDER — INSULIN ASPART PROT & ASPART (70-30 MIX) 100 UNIT/ML ~~LOC~~ SUSP: 30.0000 [IU] | Freq: Two times a day (BID) | SUBCUTANEOUS | 11 refills | Status: DC

## 2020-09-17 MED ORDER — LIVING WELL WITH DIABETES BOOK
Freq: Once | Status: AC
  Filled 2020-09-17: qty 1

## 2020-09-17 MED ORDER — GABAPENTIN 400 MG PO CAPS: 400.0000 mg | ORAL_CAPSULE | Freq: Three times a day (TID) | ORAL | 3 refills | Status: DC

## 2020-09-17 MED ORDER — INSULIN ASPART 100 UNIT/ML ~~LOC~~ SOLN: 3.0000 [IU] | Freq: Three times a day (TID) | SUBCUTANEOUS | 11 refills | Status: DC

## 2020-09-17 MED ORDER — INSULIN ASPART 100 UNIT/ML ~~LOC~~ SOLN: 5.0000 [IU] | Freq: Three times a day (TID) | SUBCUTANEOUS | 11 refills | Status: DC

## 2020-09-17 MED FILL — GABAPENTIN 400 MG CAPSULE: 400 | 30 days supply | Qty: 90 | Fill #0

## 2020-09-17 MED FILL — METOPROLOL TARTRATE 50 MG T: 50 | 30 days supply | Qty: 60 | Fill #0

## 2020-09-17 MED FILL — NOVOLOG MIX 70/30 VIAL: (70-30) 100 | 16 days supply | Qty: 10 | Fill #0

## 2020-09-17 NOTE — Care Management (Signed)
09-17-20 1022 Case Manager scheduled an appointment at the [facility] [place]- appointment placed on the AVS. Case Manager spoke with patient regarding medications. Patient uses the [facility]) to get his medications. The Rx's were sent to [facility]- patient is asking that medications be sent to [facility]. Case Manager has sent a message to the provider to make the change. Once the change has been completed, Case Manager will call [facility] to stop the fill of medications. Bethena Roys, RN,BSN Case Manager

## 2020-09-17 NOTE — Discharge Summary (Signed)
Physician Discharge Summary   Riley Hood UXL:[id number] DOB: 1966-03-24 DOA: 09/15/2020  PCP: Vevelyn Francois, NP  Admit date: 09/15/2020 Discharge date: 09/17/2020   Admitted From: home Disposition:  home Discharging physician: Dwyane Dee, MD  Recommendations for Outpatient Follow-up:  1. May need insulin adjustments further   Patient discharged to home in Discharge Condition: stable Risk of unplanned readmission score: Unplanned Admission- Pilot do not use: 10  CODE STATUS: Full Diet recommendation:  Diet Orders (From admission, onward)    Start     Ordered   09/17/20 0000  Diet Carb Modified        09/17/20 0935   09/16/20 1142  Diet Carb Modified Fluid consistency: Thin; Room service appropriate? Yes  Diet effective now       Question Answer Comment  Diet-HS Snack? Nothing   Calorie Level Medium 1600-2000   Fluid consistency: Thin   Room service appropriate? Yes      09/16/20 1141          Hospital Course: Riley Hood is a 55 year old male with PMH insulin-dependent diabetes, hypertension, hyperlipidemia who presented to the hospital with increased urinary frequency and generalized weakness.  He was found to be in DKA.  He has difficulty affording insulins outpatient and has been getting free samples from his PCP office for approximately 1 year. He was treated with DKA protocol, responded well, and was able to be transitioned off of insulin drip.  He met with diabetes coordinator as well for further education prior to discharge.  Insulins were transitioned to NovoLog 70/30 and sliding scale insulin.  [facility] as [name] was still unaffordable.    * DKA (diabetic ketoacidosis) (HCC)-resolved as of 09/16/2020 -Due to financial constraints affording insulins as patient is uninsured and getting insulin samples from PCP -Responded well to insulin drip and have transitioned off  Type 2 diabetes  mellitus with other specified complication (HCC) -Z3G 64.4% -Patient endorses difficulty affording insulins outpatient; uninsured -Diabetes coordinator consulted -Changed Lantus to 70/30 and monitor glucoses overnight to adjust further if needed: Discharged on 30 units twice daily and a sliding scale regimen with short acting  Peripheral neuropathy -Patient endorsing burning and electrical pain sensation in lower extremities -Trial of gabapentin and monitor response; prescription provided at discharge  Hyperlipidemia -Continue Lipitor  Hypertension -Continue metoprolol  Hypercalcemia-resolved as of 09/16/2020 -Was considered due to dehydration -Responded well to fluids  AKI (acute kidney injury) (HCC)-resolved as of 09/16/2020 - baseline creatinine ~ 0.8 - 1 - patient presents with increase in creat >0.3 mg/dL above baseline, creat increase >1.5x baseline presumed to have occurred within past 7 days PTA - creat 1.73 on admission; likely pre-renal  - responded well to IVF   Hyperkalemia-resolved as of 09/16/2020 - s/p treatment on admission    The patient's chronic medical conditions were treated accordingly per the patient's home medication regimen except as noted.  On day of discharge, patient was felt deemed stable for discharge. Patient/family member advised to call PCP or come back to ER if needed.   Principal Diagnosis: DKA (diabetic ketoacidosis) ([place])  Discharge Diagnoses: Active Hospital Problems   Diagnosis Date Noted  . Type 2 diabetes mellitus with other specified complication ([place]) 03/47/4259    Priority: High  . Peripheral neuropathy 09/16/2020    Priority: Medium  . Hyperlipidemia 09/16/2020  . Hypertension 05/25/2015    Resolved Hospital Problems   Diagnosis Date Noted Date Resolved  .  DKA (diabetic ketoacidosis) ([place]) 09/15/2020 09/16/2020    Priority: High  . Hypercalcemia 09/16/2020 09/16/2020  . Hyperkalemia  09/16/2020  . AKI (acute kidney injury)  ([place])  09/16/2020    Discharge Instructions    Diet Carb Modified   Complete by: As directed    Increase activity slowly   Complete by: As directed      Allergies as of 09/17/2020   No Known Allergies     Medication List    STOP taking these medications   insulin glargine 100 UNIT/ML injection Commonly known as: LANTUS     TAKE these medications   aspirin 81 MG EC tablet Take 1 tablet (81 mg total) by mouth daily. Swallow whole. What changed: additional instructions   blood glucose meter kit and supplies Kit Dispense based on patient and insurance preference. Use up to four times daily as directed. (FOR ICD-9 250.00, 250.01).   gabapentin 400 MG capsule Commonly known as: NEURONTIN Take 1 capsule (400 mg total) by mouth 3 (three) times daily.   insulin aspart 100 UNIT/ML injection Commonly known as: novoLOG Inject 5 Units into the skin 3 (three) times daily with meals. What changed: Another medication with the same name was added. Make sure you understand how and when to take each.   insulin aspart 100 UNIT/ML injection Commonly known as: novoLOG Inject 3-20 Units into the skin 3 (three) times daily with meals. Glucose 121 - 150: 3 unit, Glucose 151 - 200: 4 unit, Glucose 201 - 250: 7 unit, Glucose 251 - 300: 11 unit, Glucose 301 - 350: 15 unit, Glucose 351 - 400: 20 units, Glucose > 400 call MD What changed: You were already taking a medication with the same name, and this prescription was added. Make sure you understand how and when to take each.   insulin aspart protamine- aspart (70-30) 100 UNIT/ML injection Commonly known as: NOVOLOG MIX 70/30 Inject 0.3 mLs (30 Units total) into the skin 2 (two) times daily with a meal.   metoprolol tartrate 50 MG tablet Commonly known as: LOPRESSOR Take 1 tablet (50 mg total) by mouth 2 (two) times daily.   vitamin B-12 250 MCG tablet Commonly known as: CYANOCOBALAMIN Take 250 mcg by mouth daily.   VITAMIN D PO Take 1  tablet by mouth daily.   VITAMIN E PO Take 1 capsule by mouth daily.       Follow-up [facility] Follow up on 09/23/2020.   Specialty: Internal Medicine Why: @ 2:40 pm with Dionisio David NP for hospital follow up appointment. Please use [facility] [place] [facility] for medications- cost ranges from $4.00-$10.00. thanks  Contact information: [place] [phone] [place] [phone]             No Known Allergies  Consultations:   Discharge Exam: BP (!) 154/86 (BP Location: Left Arm)   Pulse 99   Temp 98.4 F (36.9 C) (Oral)   Resp 20   Ht 6' (1.829 m)   Wt 131.9 kg   SpO2 100%   BMI 39.43 kg/m  General appearance: alert, cooperative and no distress Head: Normocephalic, without obvious abnormality, atraumatic Eyes: EOMI Lungs: clear to auscultation bilaterally Heart: regular rate and rhythm and S1, S2 normal Abdomen: normal findings: bowel sounds normal and soft, non-tender Extremities: no edema; Left left noted with prior skin grafting and irregular shape of soleus muscle Skin: mobility and turgor normal Neurologic: Grossly  normal  The results of significant diagnostics from this hospitalization (including imaging, microbiology, ancillary and laboratory) are listed below for reference.   Microbiology: Recent Results (from the past 240 hour(s))  Urine culture     Status: None   Collection Time: 09/15/20 12:35 PM   Specimen: Urine, Random  Result Value Ref Range Status   Specimen Description URINE, RANDOM  Final   Special Requests NONE  Final   Culture   Final    NO GROWTH Performed at [facility], [address] [place], [place] [address]    Report Status 09/16/2020 FINAL  Final  SARS CORONAVIRUS 2 (TAT 6-24 HRS) Nasopharyngeal Nasopharyngeal Swab     Status: None   Collection Time: 09/15/20  2:34 PM   Specimen: Nasopharyngeal Swab  Result Value Ref Range  Status   SARS Coronavirus 2 NEGATIVE NEGATIVE Final    Comment: (NOTE) SARS-CoV-2 target nucleic acids are NOT DETECTED.  The SARS-CoV-2 RNA is generally detectable in upper and lower respiratory specimens during the acute phase of infection. Negative results do not preclude SARS-CoV-2 infection, do not rule out co-infections with other pathogens, and should not be used as the sole basis for treatment or other patient management decisions. Negative results must be combined with clinical observations, patient history, and epidemiological information. The expected result is Negative.  Fact Sheet for Patients: SugarRoll.be  Fact Sheet for Healthcare Providers: https://www.woods-mathews.com/  This test is not yet approved or cleared by the [place] FDA and  has been authorized for detection and/or diagnosis of SARS-CoV-2 by FDA under an Emergency Use Authorization (EUA). This EUA will remain  in effect (meaning this test can be used) for the duration of the COVID-19 declaration under Se ction 564(b)(1) of the Act, 21 U.S.C. section 360bbb-3(b)(1), unless the authorization is terminated or revoked sooner.  Performed at [facility], [place] [address], [place], [place] [address]      Labs: BNP (last 3 results) No results for input(s): BNP in the last 8760 hours. Basic Metabolic Panel: Recent Labs  Lab 09/15/20 1203 09/15/20 1442 09/15/20 2146 09/16/20 0233 09/17/20 0257  NA 119* 119* 131* 132* 132*  K 6.6* 6.7* 3.9 3.8 3.6  CL 81*  --  94* 93* 96*  CO2 20*  --  $R'27 25 26  'Kp$ GLUCOSE 771*  --  244* 272* 190*  BUN 37*  --  27* 23* 15  CREATININE 1.73*  --  1.02 0.94 0.91  CALCIUM 11.1*  --  10.5* 10.1 9.7  MG  --   --   --   --  1.7   Liver Function Tests: Recent Labs  Lab 09/15/20 1430  AST 18  ALT 20  ALKPHOS 84  BILITOT 1.7*  PROT 8.3*  ALBUMIN 3.8   No results for input(s): LIPASE, AMYLASE in the last 168  hours. No results for input(s): AMMONIA in the last 168 hours. CBC: Recent Labs  Lab 09/15/20 1203 09/15/20 1442 09/17/20 0257  WBC 10.5  --  7.0  NEUTROABS  --   --  3.1  HGB 16.2 17.0 14.6  HCT 45.4 50.0 42.4  MCV 72.9*  --  74.4*  PLT 261  --  184   Cardiac Enzymes: No results for input(s): CKTOTAL, CKMB, CKMBINDEX, TROPONINI in the last 168 hours. BNP: Invalid input(s): POCBNP CBG: Recent Labs  Lab 09/16/20 1218 09/16/20 1632 09/16/20 2030 09/16/20 2157 09/17/20 0818  GLUCAP 264* 309* 455* 316* 301*   D-Dimer No  results for input(s): DDIMER in the last 72 hours. Hgb A1c Recent Labs    09/15/20 2146  HGBA1C 13.1*   Lipid Profile No results for input(s): CHOL, HDL, LDLCALC, TRIG, CHOLHDL, LDLDIRECT in the last 72 hours. Thyroid function studies No results for input(s): TSH, T4TOTAL, T3FREE, THYROIDAB in the last 72 hours.  Invalid input(s): FREET3 Anemia work up No results for input(s): VITAMINB12, FOLATE, FERRITIN, TIBC, IRON, RETICCTPCT in the last 72 hours. Urinalysis    Component Value Date/Time   COLORURINE STRAW (A) 09/15/2020 1203   APPEARANCEUR CLEAR 09/15/2020 1203   LABSPEC 1.024 09/15/2020 1203   PHURINE 5.0 09/15/2020 1203   GLUCOSEU >=500 (A) 09/15/2020 1203   HGBUR NEGATIVE 09/15/2020 1203   BILIRUBINUR NEGATIVE 09/15/2020 1203   BILIRUBINUR neg 10/07/2019 0951   KETONESUR 20 (A) 09/15/2020 1203   PROTEINUR NEGATIVE 09/15/2020 1203   UROBILINOGEN 0.2 09/15/2020 1032   NITRITE NEGATIVE 09/15/2020 1203   LEUKOCYTESUR NEGATIVE 09/15/2020 1203   Sepsis Labs Invalid input(s): PROCALCITONIN,  WBC,  LACTICIDVEN Microbiology Recent Results (from the past 240 hour(s))  Urine culture     Status: None   Collection Time: 09/15/20 12:35 PM   Specimen: Urine, Random  Result Value Ref Range Status   Specimen Description URINE, RANDOM  Final   Special Requests NONE  Final   Culture   Final    NO GROWTH Performed at [facility],  [place] [address] [place], [place] [address]    Report Status 09/16/2020 FINAL  Final  SARS CORONAVIRUS 2 (TAT 6-24 HRS) Nasopharyngeal Nasopharyngeal Swab     Status: None   Collection Time: 09/15/20  2:34 PM   Specimen: Nasopharyngeal Swab  Result Value Ref Range Status   SARS Coronavirus 2 NEGATIVE NEGATIVE Final    Comment: (NOTE) SARS-CoV-2 target nucleic acids are NOT DETECTED.  The SARS-CoV-2 RNA is generally detectable in upper and lower respiratory specimens during the acute phase of infection. Negative results do not preclude SARS-CoV-2 infection, do not rule out co-infections with other pathogens, and should not be used as the sole basis for treatment or other patient management decisions. Negative results must be combined with clinical observations, patient history, and epidemiological information. The expected result is Negative.  Fact Sheet for Patients: SugarRoll.be  Fact Sheet for Healthcare Providers: https://www.woods-mathews.com/  This test is not yet approved or cleared by the [place] FDA and  has been authorized for detection and/or diagnosis of SARS-CoV-2 by FDA under an Emergency Use Authorization (EUA). This EUA will remain  in effect (meaning this test can be used) for the duration of the COVID-19 declaration under Se ction 564(b)(1) of the Act, 21 U.S.C. section 360bbb-3(b)(1), unless the authorization is terminated or revoked sooner.  Performed at [facility], [place] [address] [place], [place] [address]     Procedures/Studies: DG Chest Portable 1 View  Result Date: 09/15/2020 CLINICAL DATA:  Cough. EXAM: PORTABLE CHEST 1 VIEW COMPARISON:  May 25, 2015. FINDINGS: The heart size and mediastinal contours are within normal limits. Both lungs are clear. The visualized skeletal structures are unremarkable. IMPRESSION: No active disease. Electronically Signed   By: Marijo Conception M.D.   On: 09/15/2020 15:22      Time coordinating discharge: Over 30 minutes    Dwyane Dee, MD  [facility] 09/17/2020, 4:04 PM

## 2020-09-17 NOTE — Progress Notes (Addendum)
Inpatient Diabetes Program Recommendations  AACE/ADA: New Consensus Statement on Inpatient Glycemic Control (2015)  Target Ranges:  Prepandial:   less than 140 mg/dL      Peak postprandial:   less than 180 mg/dL (1-2 hours)      Critically ill patients:  140 - 180 mg/dL   Lab Results  Component Value Date   GLUCAP 301 (H) 09/17/2020   HGBA1C 13.1 (H) 09/15/2020    Review of Glycemic Control Results for Riley Hood, Riley Hood (MRN [id number]) as of 09/17/2020 09:38  Ref. Range 09/16/2020 16:32 09/16/2020 20:30 09/16/2020 21:57 09/17/2020 08:18  Glucose-Capillary Latest Ref Range: 70 - 99 mg/dL 309 (H) 455 (H) 316 (H) 301 (H)   Diabetes history: Type 2 DM Outpatient Diabetes medications: Lantus 34 units BID, Novolog 5 units TID Current orders for Inpatient glycemic control: Lantus 35 units BID, Novolog 70/30 20 units BID, Novolog 0-20 units TID, Novolog 0-5 units QHS  Inpatient Diabetes Program Recommendations:    Consider further increasing Novolog 70/30 to 35 units BID.   Addendum: Spoke with patient again to review Novolog 70/30, discharge doses, importance of eating with administration, when to check CBGs, when to call MD, Relion products through [employer] if necessary, and to obtain medications through [facility]. Discussed with Hassan Rowan, CM and plan to send script over to [facility].  Thanks, Bronson Curb, MSN, RNC-OB Diabetes Coordinator [phone] (8a-5p)

## 2020-09-17 NOTE — Progress Notes (Signed)
Patient ambulated in the hall independently with steady gait. Noted favoring of left knee. Had injury 20 years ago with no surgery. Complained of pain in left knee  5/10 with ambulation. No pain at rest. No intolerance, no chest pain, shortness of breath, lightheadedness or dizziness with activity. Sitting in lounge chair at this time. BP prior to ambulation 117/78 with heart rate 92. After ambulation 154/86 BP with HR 99.

## 2020-09-23 ENCOUNTER — Emergency Department (HOSPITAL_COMMUNITY): Payer: Self-pay

## 2020-09-23 ENCOUNTER — Inpatient Hospital Stay (HOSPITAL_COMMUNITY)
Admission: EM | Admit: 2020-09-23 | Discharge: 2020-10-01 | DRG: 871 | Disposition: A | Discharge status: Home Health | Payer: Self-pay | Attending: Family Medicine | Admitting: Family Medicine

## 2020-09-23 ENCOUNTER — Inpatient Hospital Stay: Payer: Self-pay | Admitting: Nurse Practitioner

## 2020-09-23 DIAGNOSIS — R509 Fever, unspecified: Secondary | ICD-10-CM

## 2020-09-23 DIAGNOSIS — Z79899 Other long term (current) drug therapy: Secondary | ICD-10-CM

## 2020-09-23 DIAGNOSIS — Z1611 Resistance to penicillins: Secondary | ICD-10-CM | POA: Diagnosis present

## 2020-09-23 DIAGNOSIS — R6520 Severe sepsis without septic shock: Secondary | ICD-10-CM | POA: Diagnosis present

## 2020-09-23 DIAGNOSIS — N179 Acute kidney failure, unspecified: Secondary | ICD-10-CM | POA: Diagnosis present

## 2020-09-23 DIAGNOSIS — Z9119 Patient's noncompliance with other medical treatment and regimen: Secondary | ICD-10-CM

## 2020-09-23 DIAGNOSIS — M4646 Discitis, unspecified, lumbar region: Secondary | ICD-10-CM | POA: Diagnosis present

## 2020-09-23 DIAGNOSIS — E785 Hyperlipidemia, unspecified: Secondary | ICD-10-CM | POA: Diagnosis present

## 2020-09-23 DIAGNOSIS — M25562 Pain in left knee: Secondary | ICD-10-CM

## 2020-09-23 DIAGNOSIS — E118 Type 2 diabetes mellitus with unspecified complications: Secondary | ICD-10-CM | POA: Diagnosis present

## 2020-09-23 DIAGNOSIS — A419 Sepsis, unspecified organism: Secondary | ICD-10-CM | POA: Diagnosis present

## 2020-09-23 DIAGNOSIS — D1724 Benign lipomatous neoplasm of skin and subcutaneous tissue of left leg: Secondary | ICD-10-CM | POA: Diagnosis present

## 2020-09-23 DIAGNOSIS — E1169 Type 2 diabetes mellitus with other specified complication: Secondary | ICD-10-CM | POA: Diagnosis present

## 2020-09-23 DIAGNOSIS — A4151 Sepsis due to Escherichia coli [E. coli]: Principal | ICD-10-CM | POA: Diagnosis present

## 2020-09-23 DIAGNOSIS — Z7982 Long term (current) use of aspirin: Secondary | ICD-10-CM

## 2020-09-23 DIAGNOSIS — I358 Other nonrheumatic aortic valve disorders: Secondary | ICD-10-CM | POA: Diagnosis present

## 2020-09-23 DIAGNOSIS — E1142 Type 2 diabetes mellitus with diabetic polyneuropathy: Secondary | ICD-10-CM | POA: Diagnosis present

## 2020-09-23 DIAGNOSIS — G4733 Obstructive sleep apnea (adult) (pediatric): Secondary | ICD-10-CM | POA: Diagnosis present

## 2020-09-23 DIAGNOSIS — M5442 Lumbago with sciatica, left side: Secondary | ICD-10-CM

## 2020-09-23 DIAGNOSIS — E861 Hypovolemia: Secondary | ICD-10-CM | POA: Diagnosis present

## 2020-09-23 DIAGNOSIS — Z794 Long term (current) use of insulin: Secondary | ICD-10-CM

## 2020-09-23 DIAGNOSIS — R7881 Bacteremia: Secondary | ICD-10-CM | POA: Diagnosis present

## 2020-09-23 DIAGNOSIS — I10 Essential (primary) hypertension: Secondary | ICD-10-CM | POA: Diagnosis present

## 2020-09-23 DIAGNOSIS — Z20822 Contact with and (suspected) exposure to covid-19: Secondary | ICD-10-CM | POA: Diagnosis present

## 2020-09-23 DIAGNOSIS — M48061 Spinal stenosis, lumbar region without neurogenic claudication: Secondary | ICD-10-CM | POA: Diagnosis present

## 2020-09-23 DIAGNOSIS — B9620 Unspecified Escherichia coli [E. coli] as the cause of diseases classified elsewhere: Secondary | ICD-10-CM | POA: Diagnosis present

## 2020-09-23 DIAGNOSIS — M25462 Effusion, left knee: Secondary | ICD-10-CM | POA: Diagnosis present

## 2020-09-23 DIAGNOSIS — D509 Iron deficiency anemia, unspecified: Secondary | ICD-10-CM | POA: Diagnosis present

## 2020-09-23 DIAGNOSIS — D696 Thrombocytopenia, unspecified: Secondary | ICD-10-CM | POA: Diagnosis present

## 2020-09-23 DIAGNOSIS — J9622 Acute and chronic respiratory failure with hypercapnia: Secondary | ICD-10-CM | POA: Diagnosis present

## 2020-09-23 DIAGNOSIS — E1165 Type 2 diabetes mellitus with hyperglycemia: Secondary | ICD-10-CM | POA: Diagnosis present

## 2020-09-23 DIAGNOSIS — R7401 Elevation of levels of liver transaminase levels: Secondary | ICD-10-CM | POA: Diagnosis present

## 2020-09-23 DIAGNOSIS — I350 Nonrheumatic aortic (valve) stenosis: Secondary | ICD-10-CM

## 2020-09-23 DIAGNOSIS — G8929 Other chronic pain: Secondary | ICD-10-CM | POA: Diagnosis present

## 2020-09-23 DIAGNOSIS — Z8616 Personal history of COVID-19: Secondary | ICD-10-CM

## 2020-09-23 DIAGNOSIS — A498 Other bacterial infections of unspecified site: Secondary | ICD-10-CM | POA: Diagnosis present

## 2020-09-23 DIAGNOSIS — E871 Hypo-osmolality and hyponatremia: Secondary | ICD-10-CM | POA: Diagnosis present

## 2020-09-23 HISTORY — DX: Hyperlipidemia, unspecified: E78.5

## 2020-09-23 LAB — CBC WITH DIFFERENTIAL/PLATELET
Abs Immature Granulocytes: 0.1 10*3/uL — ABNORMAL HIGH (ref 0.00–0.07)
Band Neutrophils: 4 %
Basophils Absolute: 0.1 10*3/uL (ref 0.0–0.1)
Basophils Relative: 1 %
Eosinophils Absolute: 0 10*3/uL (ref 0.0–0.5)
Eosinophils Relative: 0 %
HCT: 41.4 % (ref 39.0–52.0)
Hemoglobin: 14.5 g/dL (ref 13.0–17.0)
Lymphocytes Relative: 10 %
Lymphs Abs: 1 10*3/uL (ref 0.7–4.0)
MCH: 26.2 pg (ref 26.0–34.0)
MCHC: 35 g/dL (ref 30.0–36.0)
MCV: 74.9 fL — ABNORMAL LOW (ref 80.0–100.0)
Metamyelocytes Relative: 1 %
Monocytes Absolute: 0.1 10*3/uL (ref 0.1–1.0)
Monocytes Relative: 1 %
Neutro Abs: 8.4 10*3/uL — ABNORMAL HIGH (ref 1.7–7.7)
Neutrophils Relative %: 83 %
Platelets: 212 10*3/uL (ref 150–400)
RBC: 5.53 MIL/uL (ref 4.22–5.81)
RDW: 15 % (ref 11.5–15.5)
WBC: 9.7 10*3/uL (ref 4.0–10.5)
nRBC: 0.3 % — ABNORMAL HIGH (ref 0.0–0.2)
nRBC: 4 /100 WBC — ABNORMAL HIGH

## 2020-09-23 LAB — I-STAT CHEM 8, ED
BUN: 15 mg/dL (ref 6–20)
Calcium, Ion: 1.18 mmol/L (ref 1.15–1.40)
Chloride: 96 mmol/L — ABNORMAL LOW (ref 98–111)
Creatinine, Ser: 1.4 mg/dL — ABNORMAL HIGH (ref 0.61–1.24)
Glucose, Bld: 367 mg/dL — ABNORMAL HIGH (ref 70–99)
HCT: 46 % (ref 39.0–52.0)
Hemoglobin: 15.6 g/dL (ref 13.0–17.0)
Potassium: 4.9 mmol/L (ref 3.5–5.1)
Sodium: 135 mmol/L (ref 135–145)
TCO2: 25 mmol/L (ref 22–32)

## 2020-09-23 LAB — BASIC METABOLIC PANEL
Anion gap: 12 (ref 5–15)
BUN: 13 mg/dL (ref 6–20)
CO2: 22 mmol/L (ref 22–32)
Calcium: 9.1 mg/dL (ref 8.9–10.3)
Chloride: 95 mmol/L — ABNORMAL LOW (ref 98–111)
Creatinine, Ser: 1.5 mg/dL — ABNORMAL HIGH (ref 0.61–1.24)
GFR, Estimated: 55 mL/min — ABNORMAL LOW (ref >60)
Glucose, Bld: 361 mg/dL — ABNORMAL HIGH (ref 70–99)
Potassium: 4.8 mmol/L (ref 3.5–5.1)
Sodium: 129 mmol/L — ABNORMAL LOW (ref 135–145)

## 2020-09-23 LAB — RESP PANEL BY RT-PCR (FLU A&B, COVID) ARPGX2
Influenza A by PCR: NEGATIVE
Influenza B by PCR: NEGATIVE
SARS Coronavirus 2 by RT PCR: NEGATIVE

## 2020-09-23 LAB — CBG MONITORING, ED: Glucose-Capillary: 365 mg/dL — ABNORMAL HIGH (ref 70–99)

## 2020-09-23 MED ORDER — ACETAMINOPHEN 500 MG PO TABS
1000.0000 mg | ORAL_TABLET | Freq: Once | ORAL | Status: AC
  Administered 2020-09-23: 1000 mg via ORAL
  Filled 2020-09-23: qty 2

## 2020-09-23 MED ORDER — HYDROMORPHONE HCL 1 MG/ML IJ SOLN
0.5000 mg | Freq: Once | INTRAMUSCULAR | Status: AC
Start: 2020-09-23 — End: 2020-09-23
  Administered 2020-09-23: 0.5 mg via INTRAVENOUS
  Filled 2020-09-23: qty 1

## 2020-09-23 MED ORDER — ONDANSETRON HCL 4 MG/2ML IJ SOLN
4.0000 mg | Freq: Once | INTRAMUSCULAR | Status: AC
  Administered 2020-09-23: 4 mg via INTRAVENOUS
  Filled 2020-09-23: qty 2

## 2020-09-23 MED ORDER — LACTATED RINGERS IV BOLUS
2000.0000 mL | Freq: Once | INTRAVENOUS | Status: AC
  Administered 2020-09-23: 2000 mL via INTRAVENOUS

## 2020-09-23 NOTE — ED Provider Notes (Signed)
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 09/25/20 1702)  lidocaine (LIDODERM) 5 % 1 patch (1 patch Transdermal Patch Removed 09/25/20 2118)  HYDROcodone-acetaminophen (NORCO/VICODIN) 5-325 MG per tablet 1 tablet (1 tablet Oral Given 09/25/20 1803)  metoprolol tartrate (LOPRESSOR) tablet 50 mg (50 mg Oral Given 09/25/20 2116)  cefTRIAXone (ROCEPHIN) 2 g in sodium chloride 0.9 % 100 mL IVPB (2 g Intravenous New Bag/Given 09/25/20 1508)  perflutren lipid microspheres (DEFINITY) IV suspension (1 mL Intravenous Given 09/24/20 1617)  sodium chloride 0.9 % bolus 1,000 mL (1,000 mLs Intravenous New Bag/Given 09/25/20 0603)    Followed by  0.9 %  sodium chloride infusion (0 mLs Intravenous Stopped 09/25/20 2111)  metoprolol tartrate (LOPRESSOR) injection 2.5 mg (2.5 mg Intravenous Given 09/25/20 1839)  lactated ringers bolus 2,000 mL (2,000 mLs Intravenous Bolus 09/23/20 1943)  HYDROmorphone (DILAUDID) injection 0.5 mg (0.5 mg Intravenous Given 09/23/20 1943)  ondansetron  (ZOFRAN) injection 4 mg (4 mg Intravenous Given 09/23/20 1943)  acetaminophen (TYLENOL) tablet 1,000 mg (1,000 mg Oral Given 09/23/20 2121)  HYDROmorphone (DILAUDID) injection 0.5 mg (0.5 mg Intravenous Given 09/24/20 0230)  gadobutrol (GADAVIST) 1 MMOL/ML injection 10 mL (10 mLs Intravenous Contrast Given 09/24/20 0329)  HYDROmorphone (DILAUDID) injection 1 mg (1 mg Intravenous Given 09/24/20 0520)  iohexol (OMNIPAQUE) 300 MG/ML solution 100 mL (100 mLs Intravenous Contrast Given 09/24/20 0435)  sodium chloride 0.9 % bolus 1,000 mL (0 mLs Intravenous Stopped 09/24/20 0815)    Followed by  sodium chloride 0.9 % bolus 1,000 mL (0 mLs Intravenous Stopped 09/24/20 0815)  acetaminophen (TYLENOL) tablet 1,000 mg (1,000 mg Oral Given 09/24/20 0643)  vancomycin (VANCOREADY) IVPB 2000 mg/400 mL (0 mg Intravenous Stopped 09/24/20 0853)  piperacillin-tazobactam (ZOSYN) IVPB 3.375 g (0 g Intravenous Stopped 09/24/20 1019)  sodium chloride 0.9 % bolus 500 mL (500 mLs Intravenous New Bag/Given 09/24/20 1814)    Vitals:   09/25/20 1800 09/25/20 1854 09/25/20 2036 09/25/20 2214  BP: (!) 130/100  114/72 95/71  Pulse: (!) 142 (!) 136 (!) 132 (!) 117  Resp: (!) _0 Temp: 99 F (37.2 C)  99 F (37.2 C) 100.1 F (37.8 C)  TempSrc: Oral  Oral Oral  SpO2: 97%  94% 93%  Weight:      Height:        Final diagnoses:  Acute left-sided low back pain with left-sided sciatica  Fever in adult    Admission/ observation were discussed with the admitting physician, patient and/or family and they are comfortable with the plan.   Final Clinical Impression(s) / ED Diagnoses Final diagnoses:  Acute left-sided low back pain with left-sided sciatica  Fever in adult    Rx / DC Orders ED Discharge Orders    None       Deno Etienne, DO 09/25/20 2258

## 2020-09-23 NOTE — ED Triage Notes (Signed)
Pt arrives via EMS from home with complaints of back pain. Upon arrival EMS found pt tachycardic at 130's CBG 484. Pain began yesterday, sharp pain radiating down leg. Pt alert and oriented X4  Gave 500NS 138/74 100% RA RR 16 HR 110

## 2020-09-23 NOTE — ED Notes (Signed)
Patient found to have urinated in bed while trying to use urinal. Patient also inadvertently removed IV. This RN and tech helped patient remove clothing and provided full bed and gown change.

## 2020-09-23 NOTE — Progress Notes (Deleted)
Patient no-showed today's appointment; provider notified for review of record.

## 2020-09-24 ENCOUNTER — Inpatient Hospital Stay (HOSPITAL_COMMUNITY): Payer: Self-pay

## 2020-09-24 ENCOUNTER — Emergency Department (HOSPITAL_COMMUNITY): Payer: Self-pay

## 2020-09-24 ENCOUNTER — Other Ambulatory Visit: Payer: Self-pay

## 2020-09-24 ENCOUNTER — Encounter (HOSPITAL_COMMUNITY): Payer: Self-pay | Admitting: Internal Medicine

## 2020-09-24 DIAGNOSIS — R7881 Bacteremia: Secondary | ICD-10-CM | POA: Diagnosis present

## 2020-09-24 DIAGNOSIS — A419 Sepsis, unspecified organism: Secondary | ICD-10-CM

## 2020-09-24 DIAGNOSIS — A498 Other bacterial infections of unspecified site: Secondary | ICD-10-CM | POA: Diagnosis present

## 2020-09-24 DIAGNOSIS — E871 Hypo-osmolality and hyponatremia: Secondary | ICD-10-CM

## 2020-09-24 HISTORY — DX: Sepsis, unspecified organism: A41.9

## 2020-09-24 LAB — HEPATIC FUNCTION PANEL
ALT: 36 U/L (ref 0–44)
AST: 50 U/L — ABNORMAL HIGH (ref 15–41)
Albumin: 2.7 g/dL — ABNORMAL LOW (ref 3.5–5.0)
Alkaline Phosphatase: 63 U/L (ref 38–126)
Bilirubin, Direct: 0.2 mg/dL (ref 0.0–0.2)
Indirect Bilirubin: 0.5 mg/dL (ref 0.3–0.9)
Total Bilirubin: 0.7 mg/dL (ref 0.3–1.2)
Total Protein: 6.5 g/dL (ref 6.5–8.1)

## 2020-09-24 LAB — BLOOD CULTURE ID PANEL (REFLEXED) - BCID2

## 2020-09-24 LAB — CBC WITH DIFFERENTIAL/PLATELET
Abs Immature Granulocytes: 0 10*3/uL (ref 0.00–0.07)
Basophils Absolute: 0 10*3/uL (ref 0.0–0.1)
Basophils Relative: 0 %
Eosinophils Absolute: 0 10*3/uL (ref 0.0–0.5)
Eosinophils Relative: 0 %
HCT: 34.9 % — ABNORMAL LOW (ref 39.0–52.0)
Hemoglobin: 12.1 g/dL — ABNORMAL LOW (ref 13.0–17.0)
Lymphocytes Relative: 11 %
Lymphs Abs: 1 10*3/uL (ref 0.7–4.0)
MCH: 25.7 pg — ABNORMAL LOW (ref 26.0–34.0)
MCHC: 34.7 g/dL (ref 30.0–36.0)
MCV: 74.3 fL — ABNORMAL LOW (ref 80.0–100.0)
Monocytes Absolute: 0 10*3/uL — ABNORMAL LOW (ref 0.1–1.0)
Monocytes Relative: 0 %
Neutro Abs: 7.8 10*3/uL — ABNORMAL HIGH (ref 1.7–7.7)
Neutrophils Relative %: 89 %
Platelets: 146 10*3/uL — ABNORMAL LOW (ref 150–400)
RBC: 4.7 MIL/uL (ref 4.22–5.81)
RDW: 14.6 % (ref 11.5–15.5)
WBC: 8.8 10*3/uL (ref 4.0–10.5)
nRBC: 0 % (ref 0.0–0.2)
nRBC: 1 /100 WBC — ABNORMAL HIGH

## 2020-09-24 LAB — ECHOCARDIOGRAM COMPLETE
AR max vel: 1.25 cm2
AV Area VTI: 1.09 cm2
AV Area mean vel: 0.96 cm2
AV Mean grad: 41.7 mmHg
AV Peak grad: 69.9 mmHg
Ao pk vel: 4.18 m/s
S' Lateral: 2 cm

## 2020-09-24 LAB — RAPID URINE DRUG SCREEN, HOSP PERFORMED
Amphetamines: NOT DETECTED
Barbiturates: NOT DETECTED
Benzodiazepines: NOT DETECTED
Cocaine: NOT DETECTED
Opiates: POSITIVE — AB
Tetrahydrocannabinol: NOT DETECTED

## 2020-09-24 LAB — URINALYSIS, ROUTINE W REFLEX MICROSCOPIC
Bacteria, UA: NONE SEEN
Bilirubin Urine: NEGATIVE
Glucose, UA: >=500 mg/dL — AB
Ketones, ur: 20 mg/dL — AB
Leukocytes,Ua: NEGATIVE
Nitrite: NEGATIVE
Protein, ur: 30 mg/dL — AB
Specific Gravity, Urine: 1.021 (ref 1.005–1.030)
pH: 5 (ref 5.0–8.0)

## 2020-09-24 LAB — BASIC METABOLIC PANEL
Anion gap: 7 (ref 5–15)
BUN: 18 mg/dL (ref 6–20)
CO2: 23 mmol/L (ref 22–32)
Calcium: 7.9 mg/dL — ABNORMAL LOW (ref 8.9–10.3)
Chloride: 105 mmol/L (ref 98–111)
Creatinine, Ser: 1.41 mg/dL — ABNORMAL HIGH (ref 0.61–1.24)
GFR, Estimated: 59 mL/min — ABNORMAL LOW (ref >60)
Glucose, Bld: 232 mg/dL — ABNORMAL HIGH (ref 70–99)
Potassium: 4.2 mmol/L (ref 3.5–5.1)
Sodium: 135 mmol/L (ref 135–145)

## 2020-09-24 LAB — CBG MONITORING, ED
Glucose-Capillary: 312 mg/dL — ABNORMAL HIGH (ref 70–99)
Glucose-Capillary: 221 mg/dL — ABNORMAL HIGH (ref 70–99)

## 2020-09-24 LAB — LACTIC ACID, PLASMA
Lactic Acid, Venous: 1.9 mmol/L (ref 0.5–1.9)
Lactic Acid, Venous: 2 mmol/L (ref 0.5–1.9)
Lactic Acid, Venous: 2.6 mmol/L (ref 0.5–1.9)
Lactic Acid, Venous: 2.9 mmol/L (ref 0.5–1.9)

## 2020-09-24 LAB — SEDIMENTATION RATE: Sed Rate: 46 mm/hr — ABNORMAL HIGH (ref 0–16)

## 2020-09-24 LAB — GLUCOSE, CAPILLARY
Glucose-Capillary: 209 mg/dL — ABNORMAL HIGH (ref 70–99)
Glucose-Capillary: 220 mg/dL — ABNORMAL HIGH (ref 70–99)

## 2020-09-24 LAB — C-REACTIVE PROTEIN: CRP: 45.3 mg/dL — ABNORMAL HIGH (ref <1.0)

## 2020-09-24 MED ORDER — PERFLUTREN LIPID MICROSPHERE
1.0000 mL | INTRAVENOUS | Status: AC | PRN
  Administered 2020-09-24: 1 mL via INTRAVENOUS
  Filled 2020-09-24: qty 10

## 2020-09-24 MED ORDER — ONDANSETRON HCL 4 MG PO TABS: 4.0000 mg | ORAL_TABLET | Freq: Four times a day (QID) | ORAL | Status: DC | PRN

## 2020-09-24 MED ORDER — LIDOCAINE 5 % EX PTCH
1.0000 | MEDICATED_PATCH | Freq: Every day | CUTANEOUS | Status: DC
  Administered 2020-09-24 – 2020-10-01 (×7): 1 via TRANSDERMAL
  Filled 2020-09-24 (×12): qty 1

## 2020-09-24 MED ORDER — VANCOMYCIN HCL 1000 MG/200ML IV SOLN: 1000.0000 mg | Freq: Two times a day (BID) | INTRAVENOUS | Status: DC

## 2020-09-24 MED ORDER — SODIUM CHLORIDE 0.9 % IV BOLUS
500.0000 mL | Freq: Once | INTRAVENOUS | Status: AC
  Administered 2020-09-24: 500 mL via INTRAVENOUS

## 2020-09-24 MED ORDER — ACETAMINOPHEN 325 MG PO TABS: 325.0000 mg | ORAL_TABLET | Freq: Once | ORAL | Status: DC

## 2020-09-24 MED ORDER — SODIUM CHLORIDE 0.9 % IV BOLUS (SEPSIS)
1000.0000 mL | Freq: Once | INTRAVENOUS | Status: AC
  Administered 2020-09-24: 1000 mL via INTRAVENOUS

## 2020-09-24 MED ORDER — METOPROLOL TARTRATE 50 MG PO TABS
50.0000 mg | ORAL_TABLET | Freq: Two times a day (BID) | ORAL | Status: DC
  Administered 2020-09-24 – 2020-10-01 (×14): 50 mg via ORAL
  Filled 2020-09-24 (×2): qty 1
  Filled 2020-09-24: qty 2
  Filled 2020-09-24 (×12): qty 1

## 2020-09-24 MED ORDER — ENOXAPARIN SODIUM 40 MG/0.4ML ~~LOC~~ SOLN
40.0000 mg | Freq: Every day | SUBCUTANEOUS | Status: DC
  Administered 2020-09-24 – 2020-09-27 (×4): 40 mg via SUBCUTANEOUS
  Filled 2020-09-24 (×4): qty 0.4

## 2020-09-24 MED ORDER — ASPIRIN EC 81 MG PO TBEC
81.0000 mg | DELAYED_RELEASE_TABLET | Freq: Every day | ORAL | Status: DC
  Administered 2020-09-24 – 2020-10-01 (×8): 81 mg via ORAL
  Filled 2020-09-24 (×8): qty 1

## 2020-09-24 MED ORDER — ACETAMINOPHEN 500 MG PO TABS
1000.0000 mg | ORAL_TABLET | Freq: Once | ORAL | Status: AC
  Administered 2020-09-24: 1000 mg via ORAL
  Filled 2020-09-24: qty 2

## 2020-09-24 MED ORDER — ALBUTEROL SULFATE (2.5 MG/3ML) 0.083% IN NEBU: 2.5000 mg | INHALATION_SOLUTION | Freq: Four times a day (QID) | RESPIRATORY_TRACT | Status: DC | PRN

## 2020-09-24 MED ORDER — ACETAMINOPHEN 650 MG RE SUPP: 650.0000 mg | Freq: Four times a day (QID) | RECTAL | Status: DC | PRN

## 2020-09-24 MED ORDER — ONDANSETRON HCL 4 MG/2ML IJ SOLN: 4.0000 mg | Freq: Four times a day (QID) | INTRAMUSCULAR | Status: DC | PRN

## 2020-09-24 MED ORDER — HYDROMORPHONE HCL 1 MG/ML IJ SOLN
1.0000 mg | Freq: Once | INTRAMUSCULAR | Status: AC
Start: 2020-09-24 — End: 2020-09-24
  Administered 2020-09-24: 1 mg via INTRAVENOUS
  Filled 2020-09-24: qty 1

## 2020-09-24 MED ORDER — SODIUM CHLORIDE 0.9 % IV SOLN
1000.0000 mL | INTRAVENOUS | Status: DC
  Administered 2020-09-24 (×3): 1000 mL via INTRAVENOUS

## 2020-09-24 MED ORDER — INSULIN ASPART PROT & ASPART (70-30 MIX) 100 UNIT/ML ~~LOC~~ SUSP
30.0000 [IU] | Freq: Two times a day (BID) | SUBCUTANEOUS | Status: DC
  Administered 2020-09-24 – 2020-09-30 (×13): 30 [IU] via SUBCUTANEOUS
  Filled 2020-09-24 (×2): qty 10

## 2020-09-24 MED ORDER — GADOBUTROL 1 MMOL/ML IV SOLN
10.0000 mL | Freq: Once | INTRAVENOUS | Status: AC | PRN
  Administered 2020-09-24: 10 mL via INTRAVENOUS

## 2020-09-24 MED ORDER — PIPERACILLIN-TAZOBACTAM 3.375 G IVPB: 3.3750 g | Freq: Three times a day (TID) | INTRAVENOUS | Status: DC

## 2020-09-24 MED ORDER — ACETAMINOPHEN 325 MG PO TABS
650.0000 mg | ORAL_TABLET | Freq: Four times a day (QID) | ORAL | Status: DC | PRN
  Administered 2020-09-24 – 2020-09-28 (×7): 650 mg via ORAL
  Filled 2020-09-24 (×7): qty 2

## 2020-09-24 MED ORDER — HYDROCODONE-ACETAMINOPHEN 5-325 MG PO TABS
1.0000 | ORAL_TABLET | Freq: Four times a day (QID) | ORAL | Status: DC | PRN
  Administered 2020-09-24 – 2020-10-01 (×8): 1 via ORAL
  Filled 2020-09-24 (×8): qty 1

## 2020-09-24 MED ORDER — VANCOMYCIN HCL 2000 MG/400ML IV SOLN
2000.0000 mg | Freq: Once | INTRAVENOUS | Status: AC
  Administered 2020-09-24: 2000 mg via INTRAVENOUS
  Filled 2020-09-24: qty 400

## 2020-09-24 MED ORDER — INSULIN ASPART 100 UNIT/ML ~~LOC~~ SOLN
0.0000 [IU] | Freq: Three times a day (TID) | SUBCUTANEOUS | Status: DC
  Administered 2020-09-24: 7 [IU] via SUBCUTANEOUS
  Administered 2020-09-24: 15 [IU] via SUBCUTANEOUS
  Administered 2020-09-24: 7 [IU] via SUBCUTANEOUS
  Administered 2020-09-25: 11 [IU] via SUBCUTANEOUS
  Administered 2020-09-25: 4 [IU] via SUBCUTANEOUS
  Administered 2020-09-25: 11 [IU] via SUBCUTANEOUS
  Administered 2020-09-26: 7 [IU] via SUBCUTANEOUS
  Administered 2020-09-26: 2 [IU] via SUBCUTANEOUS
  Administered 2020-09-26 – 2020-09-27 (×3): 4 [IU] via SUBCUTANEOUS
  Administered 2020-09-27: 7 [IU] via SUBCUTANEOUS
  Administered 2020-09-28: 3 [IU] via SUBCUTANEOUS
  Administered 2020-09-28: 4 [IU] via SUBCUTANEOUS
  Administered 2020-09-28: 7 [IU] via SUBCUTANEOUS
  Administered 2020-09-30: 3 [IU] via SUBCUTANEOUS
  Administered 2020-09-30 – 2020-10-01 (×3): 4 [IU] via SUBCUTANEOUS
  Administered 2020-10-01: 3 [IU] via SUBCUTANEOUS

## 2020-09-24 MED ORDER — PIPERACILLIN-TAZOBACTAM 3.375 G IVPB
3.3750 g | Freq: Once | INTRAVENOUS | Status: AC
  Administered 2020-09-24: 3.375 g via INTRAVENOUS
  Filled 2020-09-24: qty 50

## 2020-09-24 MED ORDER — SODIUM CHLORIDE 0.9 % IV SOLN
2.0000 g | INTRAVENOUS | Status: DC
  Administered 2020-09-24 – 2020-09-27 (×4): 2 g via INTRAVENOUS
  Filled 2020-09-24: qty 2
  Filled 2020-09-24 (×4): qty 20

## 2020-09-24 MED ORDER — INSULIN ASPART 100 UNIT/ML ~~LOC~~ SOLN: 0.0000 [IU] | Freq: Three times a day (TID) | SUBCUTANEOUS | Status: DC

## 2020-09-24 MED ORDER — IOHEXOL 300 MG/ML  SOLN
100.0000 mL | Freq: Once | INTRAMUSCULAR | Status: AC | PRN
  Administered 2020-09-24: 100 mL via INTRAVENOUS

## 2020-09-24 MED ORDER — SODIUM CHLORIDE 0.9% FLUSH
3.0000 mL | Freq: Two times a day (BID) | INTRAVENOUS | Status: DC
  Administered 2020-09-24 – 2020-10-01 (×9): 3 mL via INTRAVENOUS

## 2020-09-24 MED ORDER — HYDROMORPHONE HCL 1 MG/ML IJ SOLN
0.5000 mg | Freq: Once | INTRAMUSCULAR | Status: AC
  Administered 2020-09-24: 0.5 mg via INTRAVENOUS
  Filled 2020-09-24: qty 1
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
## 2020-09-24 NOTE — ED Notes (Signed)
Patient in MRI, will obtain vital signs as soon as he returns

## 2020-09-24 NOTE — Progress Notes (Signed)
Patient only able to make it through lumbar MRI. He was in too much pain and breathing too heavy to obtain diagnostic images of his pelvis. I spoke to a Radiologist and he recommended a CT of his pelvis instead.

## 2020-09-24 NOTE — Progress Notes (Addendum)
HOSPITAL MEDICINE OVERNIGHT EVENT NOTE    Notified by nursing that patient is exhibiting narrow complex tachycardia in excess of 160BPM.  Patient denies chest pain or shortness of breath.  Patient is normotensive.  No previous history of arrhythmias.   Chart reviewed.  Patient is appropriately on Ceftriaxone IV for Ecoli bactermia.  Patient is receiving IVF and is making good urine output.  Patient is AAO x 3 per nursing.  Obtaining stat ECG.  Obtaining CBC, BMP, Lactic acid.  Nursing to update me once this is obtained.  Vernelle Emerald  MD [facility]   ADDENDUM (3/11 4:45am)  Notified by nursing lactic acid level is still somewhat elevated at 2.6. Ordering additional bolus of 1 L normal saline over 2 hours followed by continuous normal saline infusion of 125 cc/h.  ECG earlier revealed sinus tachycardia.  Heart rate gradually downtrending throughout the night shift.  Sherryll Burger Riannon Mukherjee

## 2020-09-24 NOTE — ED Provider Notes (Signed)
I assumed care of this patient.  Please see previous provider note for further details of Hx, PE.  Briefly patient is a 55 y.o. male who presented back pain and fever currently awaiting MRI of the lumbar spine and pelvis to assess for any occult infection.  MRI of the lumbar spine without discitis/osteomyelitis or epidural abscess.  Unable to obtain MRI of the pelvis due to patient moving.  CT of the abdomen and pelvis was ordered and negative.  UA without evidence of infection.  No other source of infection.  Covid and influenza negative.  Lactic acid was elevated but no leukocytosis.  Patient continued to spike fevers of 103 and remained tachycardic.  IV fluid boluses and antibiotics were ordered.  I assessed the patient and he denied any neck pain or headache concerning for meningitis.  Patient was admitted to medicine for further work-up and management.   Marland Kitchen1-3 Lead EKG Interpretation Performed by: Fatima Blank, MD Authorized by: Fatima Blank, MD     Interpretation: abnormal     ECG rate:  123   ECG rate assessment: tachycardic     Rhythm: sinus tachycardia     Ectopy: none     Conduction: normal   .Critical Care Performed by: Fatima Blank, MD Authorized by: Fatima Blank, MD   Critical care provider statement:    Critical care time (minutes):  45   Critical care was time spent personally by me on the following activities:  Discussions with consultants, evaluation of patient's response to treatment, examination of patient, ordering and performing treatments and interventions, ordering and review of laboratory studies, ordering and review of radiographic studies, pulse oximetry, re-evaluation of patient's condition, obtaining history from patient or surrogate and review of old [place] discussed with: admitting provider        Fatima Blank, MD 09/24/20 [phone]

## 2020-09-24 NOTE — Progress Notes (Signed)
*  PRELIMINARY RESULTS* Echocardiogram 2D Echocardiogram has been performed.  Riley Hood 09/24/2020, 4:30 PM

## 2020-09-24 NOTE — ED Notes (Signed)
Patient transported to MRI 

## 2020-09-24 NOTE — Progress Notes (Signed)
Pharmacy Antibiotic Note  Riley Hood is a 55 y.o. male admitted on 09/23/2020 with back pain and fever, possible sepsis.  Pharmacy has been consulted for Vancomycin and Zosyn  dosing.  Plan: Vancomycin 2 g IV, then 1 g IV q12h Zosyn 3.375 g IV q8h      Temp (24hrs), Avg:101.7 F (38.7 C), Min:98.4 F (36.9 C), Max:103.8 F (39.9 C)  Recent Labs  Lab 09/23/20 1940 09/23/20 1956 09/23/20 2341  WBC 9.7  --   --   CREATININE 1.50* 1.40*  --   LATICACIDVEN  --   --  2.0*    Estimated Creatinine Clearance: 83.7 mL/min (A) (by C-G formula based on SCr of 1.4 mg/dL (H)).    No Known Allergies   Riley Hood 09/24/2020 8:18 AM

## 2020-09-24 NOTE — Progress Notes (Signed)
PHARMACY - PHYSICIAN COMMUNICATION CRITICAL VALUE ALERT - BLOOD CULTURE IDENTIFICATION (BCID)  Riley Hood is an 55 y.o. male who presented to [facility] on 09/23/2020 with a chief complaint of back pain and fever with possible sepsis.   Assessment: Currently afebrile (Tm 103.8). On admit, WBC 9.7, LAC 2). Blood cultures growing GNRs in 4/4 bottles. BCID reports E. Coli with no resistance detected.   Name of physician (or Provider) Contacted: Fuller Plan, MD  Current antibiotics: Vancomycin, Zosyn  Changes to prescribed antibiotics recommended:  Change antibiotics to ceftriaxone 2g IV q24h  Results for orders placed or performed during the hospital encounter of 09/23/20  Blood Culture ID Panel (Reflexed) (Collected: 09/23/2020  9:23 PM)  Result Value Ref Range   Enterococcus faecalis NOT DETECTED NOT DETECTED   Enterococcus Faecium NOT DETECTED NOT DETECTED   Listeria monocytogenes NOT DETECTED NOT DETECTED   Staphylococcus species NOT DETECTED NOT DETECTED   Staphylococcus aureus (BCID) NOT DETECTED NOT DETECTED   Staphylococcus epidermidis NOT DETECTED NOT DETECTED   Staphylococcus lugdunensis NOT DETECTED NOT DETECTED   Streptococcus species NOT DETECTED NOT DETECTED   Streptococcus agalactiae NOT DETECTED NOT DETECTED   Streptococcus pneumoniae NOT DETECTED NOT DETECTED   Streptococcus pyogenes NOT DETECTED NOT DETECTED   A.calcoaceticus-baumannii NOT DETECTED NOT DETECTED   Bacteroides fragilis NOT DETECTED NOT DETECTED   Enterobacterales DETECTED (A) NOT DETECTED   Enterobacter cloacae complex NOT DETECTED NOT DETECTED   Escherichia coli DETECTED (A) NOT DETECTED   Klebsiella aerogenes NOT DETECTED NOT DETECTED   Klebsiella oxytoca NOT DETECTED NOT DETECTED   Klebsiella pneumoniae NOT DETECTED NOT DETECTED   Proteus species NOT DETECTED NOT DETECTED   Salmonella species NOT DETECTED NOT DETECTED   Serratia marcescens NOT DETECTED NOT DETECTED   Haemophilus influenzae  NOT DETECTED NOT DETECTED   Neisseria meningitidis NOT DETECTED NOT DETECTED   Pseudomonas aeruginosa NOT DETECTED NOT DETECTED   Stenotrophomonas maltophilia NOT DETECTED NOT DETECTED   Candida albicans NOT DETECTED NOT DETECTED   Candida auris NOT DETECTED NOT DETECTED   Candida glabrata NOT DETECTED NOT DETECTED   Candida krusei NOT DETECTED NOT DETECTED   Candida parapsilosis NOT DETECTED NOT DETECTED   Candida tropicalis NOT DETECTED NOT DETECTED   Cryptococcus neoformans/gattii NOT DETECTED NOT DETECTED   CTX-M ESBL NOT DETECTED NOT DETECTED   Carbapenem resistance IMP NOT DETECTED NOT DETECTED   Carbapenem resistance KPC NOT DETECTED NOT DETECTED   Carbapenem resistance NDM NOT DETECTED NOT DETECTED   Carbapenem resist OXA 48 LIKE NOT DETECTED NOT DETECTED   Carbapenem resistance VIM NOT DETECTED NOT DETECTED    Claudina Lick, PharmD PGY1 Acute Care Pharmacy Resident 09/24/2020 11:23 AM  Please check [email or web address] for unit-specific pharmacy phone numbers.

## 2020-09-24 NOTE — Progress Notes (Signed)
MD Tamala Julian paged in regards to patient's yellow MEWS due to fever and tachycardia. Also notified of critical lactic acid of 2.9. New orders for saline bolus and another LA draw.

## 2020-09-25 LAB — COMPREHENSIVE METABOLIC PANEL
ALT: 43 U/L (ref 0–44)
AST: 77 U/L — ABNORMAL HIGH (ref 15–41)
Albumin: 2.4 g/dL — ABNORMAL LOW (ref 3.5–5.0)
Alkaline Phosphatase: 75 U/L (ref 38–126)
Anion gap: 8 (ref 5–15)
BUN: 17 mg/dL (ref 6–20)
CO2: 21 mmol/L — ABNORMAL LOW (ref 22–32)
Calcium: 7.9 mg/dL — ABNORMAL LOW (ref 8.9–10.3)
Chloride: 102 mmol/L (ref 98–111)
Creatinine, Ser: 1.43 mg/dL — ABNORMAL HIGH (ref 0.61–1.24)
GFR, Estimated: 58 mL/min — ABNORMAL LOW (ref >60)
Glucose, Bld: 219 mg/dL — ABNORMAL HIGH (ref 70–99)
Potassium: 3.7 mmol/L (ref 3.5–5.1)
Sodium: 131 mmol/L — ABNORMAL LOW (ref 135–145)
Total Bilirubin: 0.6 mg/dL (ref 0.3–1.2)
Total Protein: 5.8 g/dL — ABNORMAL LOW (ref 6.5–8.1)

## 2020-09-25 LAB — CBC
HCT: 33.6 % — ABNORMAL LOW (ref 39.0–52.0)
Hemoglobin: 11.7 g/dL — ABNORMAL LOW (ref 13.0–17.0)
MCH: 25.9 pg — ABNORMAL LOW (ref 26.0–34.0)
MCHC: 34.8 g/dL (ref 30.0–36.0)
MCV: 74.3 fL — ABNORMAL LOW (ref 80.0–100.0)
Platelets: 121 10*3/uL — ABNORMAL LOW (ref 150–400)
RBC: 4.52 MIL/uL (ref 4.22–5.81)
RDW: 14.6 % (ref 11.5–15.5)
WBC: 11 10*3/uL — ABNORMAL HIGH (ref 4.0–10.5)
nRBC: 0 % (ref 0.0–0.2)

## 2020-09-25 LAB — GLUCOSE, CAPILLARY
Glucose-Capillary: 190 mg/dL — ABNORMAL HIGH (ref 70–99)
Glucose-Capillary: 294 mg/dL — ABNORMAL HIGH (ref 70–99)
Glucose-Capillary: 271 mg/dL — ABNORMAL HIGH (ref 70–99)
Glucose-Capillary: 128 mg/dL — ABNORMAL HIGH (ref 70–99)

## 2020-09-25 LAB — LACTIC ACID, PLASMA: Lactic Acid, Venous: 1.2 mmol/L (ref 0.5–1.9)

## 2020-09-25 LAB — TSH: TSH: 1.289 u[IU]/mL (ref 0.350–4.500)

## 2020-09-25 MED ORDER — SODIUM CHLORIDE 0.9 % IV BOLUS
1000.0000 mL | Freq: Once | INTRAVENOUS | Status: AC
  Administered 2020-09-25: 1000 mL via INTRAVENOUS

## 2020-09-25 MED ORDER — SODIUM CHLORIDE 0.9 % IV SOLN: INTRAVENOUS | Status: DC

## 2020-09-25 MED ORDER — METOPROLOL TARTRATE 5 MG/5ML IV SOLN
2.5000 mg | INTRAVENOUS | Status: DC | PRN
  Administered 2020-09-25: 2.5 mg via INTRAVENOUS
  Filled 2020-09-25: qty 5

## 2020-09-25 MED ORDER — SODIUM CHLORIDE 0.9 % IV SOLN: INTRAVENOUS | Status: AC

## 2020-09-25 NOTE — Progress Notes (Signed)
   09/25/20 1800  Assess: MEWS Score  Temp 99 F (37.2 C)  BP (!) 130/100  Pulse Rate (!) 142  Resp (!) 22  Level of Consciousness Alert  SpO2 97 %  O2 Device Room Air  Assess: MEWS Score  MEWS Temp 0  MEWS Systolic 0  MEWS Pulse 3  MEWS RR 1  MEWS LOC 0  MEWS Score 4  MEWS Score Color Red  Assess: if the MEWS score is Yellow or Red  Were vital signs taken at a resting state? Yes  Focused Assessment No change from prior assessment  Early Detection of Sepsis Score *See Row Information* Medium  MEWS guidelines implemented *See Row Information* No, previously red, continue vital signs every 4 hours  Escalate  MEWS: Escalate Red: discuss with charge nurse/RN and provider, consider discussing with RRT  Notify: Charge Nurse/RN  Name of Charge Nurse/RN Notified Prentiss Bells  Date Charge Nurse/RN Notified 09/25/20  Time Charge Nurse/RN Notified 7903  Notify: Provider  Provider Name/Title Dr. Nevada Crane  Date Provider Notified 09/25/20  Time Provider Notified 1820  Notification Type Page  Notification Reason  (red MEWS)  Provider response See new orders  Date of Provider Response 09/25/20  Time of Provider Response 1823  Document  Patient Outcome Stabilized after interventions  Progress note created (see row info) Yes

## 2020-09-25 NOTE — Progress Notes (Signed)
MD  notified elevated lactic acid normal saline bolus 1 l administered as per MD order

## 2020-09-25 NOTE — Progress Notes (Signed)
Inpatient Diabetes Program Recommendations  AACE/ADA: New Consensus Statement on Inpatient Glycemic Control (2015)  Target Ranges:  Prepandial:   less than 140 mg/dL      Peak postprandial:   less than 180 mg/dL (1-2 hours)      Critically ill patients:  140 - 180 mg/dL   Lab Results  Component Value Date   GLUCAP 190 (H) 09/25/2020   HGBA1C 13.1 (H) 09/15/2020    Review of Glycemic Control Results for Riley Hood, Riley Hood (MRN [id number]) as of 09/25/2020 11:13  Ref. Range 09/24/2020 08:03 09/24/2020 12:42 09/24/2020 16:03 09/24/2020 21:13 09/25/2020 06:01  Glucose-Capillary Latest Ref Range: 70 - 99 mg/dL 312 (H) 221 (H) 209 (H) 220 (H) 190 (H)   Diabetes history: DM 2 Outpatient Diabetes medications: 70/30 30 units bid, Novolog 3-20 units tid Current orders for Inpatient glycemic control:  70/30 30 units bid Novolog 0-20 units tid  Inpatient Diabetes Program Recommendations:    -  Increase 70/30 to 35 units bid.  Thanks,  Tama Headings RN, MSN, BC-ADM Inpatient Diabetes Coordinator Team Pager [phone] (8a-5p)

## 2020-09-25 NOTE — Progress Notes (Signed)
PROGRESS NOTE  Riley Hood BJS:[id number] DOB: 1966/01/17 DOA: 09/23/2020 PCP: Vevelyn Francois, NP  HPI/Recap of past 24 hours:  HPI: Riley Hood is a 55 y.o. 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 Patient had just been recently hospitalized from 3/1-3/3 with diabetic ketoacidosis which resolved with IV fluids and drip.  He was also noted to have signs of peripheral neuropathy and started on gabapentin.  After getting home patient reports that he was doing okay and has been taking medications as advised.  Symptoms with his back seem to start after he had been outside raking the yard a couple days ago.  Initially thought that he may have pulled a muscle.  When he went to stand up had a sharp pain in the lower part of his back that radiated down his left leg.  Noted associated symptoms of some numbness down the left leg as well.  Denies having any recent nausea, vomiting, diarrhea, perianal anesthesia, or inability to urinate.  He admits that he had not taken his insulin yesterday due to the severity of the pain  ED Course: Upon admission into the emergency department patient was seen to be febrile up to 103.8 F, pulse 10 2-1 39, respirations 13-29, blood pressure 90/57-141/127, and O2 saturations 89-97% currently on room air.  Labs from 3/9 significant for WBC 9.7, sodium 129, BUN 13, creatinine 1.5, and glucose 361.  Urinalysis was significant for moderate hemoglobin, glucose, ketones, and no bacteria.  Chest x-ray was clear.  Influenza and COVID-19 screening were negative.  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  Patient has been given full fluid bolus with vancomycin, Zosyn, acetaminophen, and  hydromorphone.  MRI of the pelvis pending.  09/25/20: Seen and examined at his bedside.  He is somnolent but arousable to voices.  Reports lower back pain.  He is currently being treated for E. coli bacteremia.  Blood cultures x2 repeated on 09/25/2020.  Assessment/Plan: Principal Problem:   Sepsis ([place]) Active Problems:   Type 2 diabetes mellitus with other specified complication (HCC)   AKI (acute kidney injury) ([place])   Bacterial infection due to E. coli   Bacteremia due to Escherichia coli  Sepsis secondary to E. coli bacteremia, unclear source.   Presented with complaints of back pain  On presentation febrile with T-max 103.9, tachycardia and tachypnea  Blood cultures positive for E. coli x2 peripherally.   Repeat blood cultures x2.  Final 09/25/2020.  Follow cultures results.   Is currently treated with Rocephin 2 g daily.   Monitor fever curve and WBC.   2D echo 09/24/2020 showed LVEF 70 to 75%  AKI Creatinine at baseline 0.9 with GFR greater than 60 Presented with creatinine of 1.4 with GFR 58. Continue IV fluid Continue to avoid nephrotoxic agents Monitor urine output Repeat BMP in the morning   Type 2 diabetes with hyperglycemia Hemoglobin A1c 13.1 on 09/15/2020 He is currently on 70/30 insulin 30 units twice daily, insulin sliding scale.    Essential hypertension, BPs are currently soft. Currently on metoprolol tartrate 50 mg twice daily. Continue to monitor vital signs.  Hypovolemic hyponatremia  Presented with serum sodium 129 Serum sodium 131, continue normal saline  Encourage oral intake.  Isolated elevated AST. Unclear etiology Monitor  Chronic microcytic anemia/acute thrombocytopenia Mild drop in hemoglobin 11.7 with MCV of 74. No overt bleeding Continue to monitor  Chronic lower back pain No acute bony abnormality on CT abdomen and pelvis with contrast done on 09/24/2020. Supportive care.    DVT prophylaxis: Lovenox subcu daily Code Status:  Full Family Communication: Family to be updated over the phone Disposition Plan: Hopefully discharge home once medically stable Consults called: None   Procedures:  None.  Antimicrobials:  Rocephin  DVT prophylaxis: Subcu Lovenox daily.  Status is: Inpatient   Dispo: The patient is from: Home.              Anticipated d/c is to: Home.               Patient currently not stable for discharge due to ongoing treatment of sepsis secondary to E. coli bacteremia.   Difficult to place patient: Not applicable.        Objective: Vitals:   09/24/20 2312 09/25/20 0009 09/25/20 0414 09/25/20 0818  BP: 98/68 110/75 110/84 110/76  Pulse: (!) 119 (!) 121 (!) 106 (!) 110  Resp: 20 (!) 21 20 20   Temp: (!) 102 F (38.9 C) 100.2 F (37.9 C) 97.7 F (36.5 C) 97.9 F (36.6 C)  TempSrc: Oral Oral Oral Oral  SpO2: 93% 98% 95% 95%    Intake/Output Summary (Last 24 hours) at 09/25/2020 1124 Last data filed at 09/25/2020 0453 Gross per 24 hour  Intake 1617.51 ml  Output 1025 ml  Net 592.51 ml   There were no vitals filed for this visit.  Exam:  . General: 55 y.o. year-old male well developed well nourished in no acute distress.  Somnolent but is arousable to voices. . Cardiovascular: Regular rate and rhythm with no rubs or gallops.  No thyromegaly or JVD noted.   Marland Kitchen Respiratory: Clear to auscultation with no wheezes or rales.  Poor inspiratory effort. . Abdomen: Soft nontender nondistended with normal bowel sounds x4 quadrants. . Musculoskeletal: Trace lower extremity edema. . Skin: No ulcerative lesions noted or rashes . Psychiatry: Mood is appropriate for condition and setting   Data Reviewed: CBC: Recent Labs  Lab 09/23/20 1940 09/23/20 1956 09/24/20 2059 09/25/20 0053  WBC 9.7  --  8.8 11.0*  NEUTROABS 8.4*  --  7.8*  --   HGB 14.5 15.6 12.1* 11.7*  HCT 41.4 46.0 34.9* 33.6*  MCV 74.9*  --  74.3* 74.3*  PLT 212  --  146* 176*   Basic Metabolic Panel: Recent  Labs  Lab 09/23/20 1940 09/23/20 1956 09/24/20 2059 09/25/20 0053  NA 129* 135 135 131*  K 4.8 4.9 4.2 3.7  CL 95* 96* 105 102  CO2 22  --  23 21*  GLUCOSE 361* 367* 232* 219*  BUN 13 15 18 17   CREATININE 1.50* 1.40* 1.41* 1.43*  CALCIUM 9.1  --  7.9* 7.9*   GFR: Estimated Creatinine Clearance: 82 mL/min (A) (by C-G formula based on SCr of 1.43 mg/dL (H)). Liver Function Tests: Recent Labs  Lab 09/23/20 2319 09/25/20 0053  AST 50* 77*  ALT 36 43  ALKPHOS 63 75  BILITOT 0.7 0.6  PROT 6.5 5.8*  ALBUMIN 2.7* 2.4*   No results for input(s): LIPASE, AMYLASE in the last 168 hours. No results for input(s): AMMONIA in the last 168 hours. Coagulation Profile: No results for input(s): INR, PROTIME in the last 168 hours. Cardiac Enzymes: No results for input(s): CKTOTAL,  CKMB, CKMBINDEX, TROPONINI in the last 168 hours. BNP (last 3 results) No results for input(s): PROBNP in the last 8760 hours. HbA1C: No results for input(s): HGBA1C in the last 72 hours. CBG: Recent Labs  Lab 09/24/20 0803 09/24/20 1242 09/24/20 1603 09/24/20 2113 09/25/20 0601  GLUCAP 312* 221* 209* 220* 190*   Lipid Profile: No results for input(s): CHOL, HDL, LDLCALC, TRIG, CHOLHDL, LDLDIRECT in the last 72 hours. Thyroid Function Tests: No results for input(s): TSH, T4TOTAL, FREET4, T3FREE, THYROIDAB in the last 72 hours. Anemia Panel: No results for input(s): VITAMINB12, FOLATE, FERRITIN, TIBC, IRON, RETICCTPCT in the last 72 hours. Urine analysis:    Component Value Date/Time   COLORURINE AMBER (A) 09/24/2020 0223   APPEARANCEUR HAZY (A) 09/24/2020 0223   LABSPEC 1.021 09/24/2020 0223   PHURINE 5.0 09/24/2020 0223   GLUCOSEU >=500 (A) 09/24/2020 0223   HGBUR MODERATE (A) 09/24/2020 0223   BILIRUBINUR NEGATIVE 09/24/2020 0223   BILIRUBINUR neg 10/07/2019 0951   KETONESUR 20 (A) 09/24/2020 0223   PROTEINUR 30 (A) 09/24/2020 0223   UROBILINOGEN 0.2 09/15/2020 1032   NITRITE NEGATIVE  09/24/2020 0223   LEUKOCYTESUR NEGATIVE 09/24/2020 0223   Sepsis Labs: @LABRCNTIP (procalcitonin:4,lacticidven:4)  ) Recent Results (from the past 240 hour(s))  Urine culture     Status: None   Collection Time: 09/15/20 12:35 PM   Specimen: Urine, Random  Result Value Ref Range Status   Specimen Description URINE, RANDOM  Final   Special Requests NONE  Final   Culture   Final    NO GROWTH Performed at [facility], [place] [address] [place], [place] [address]    Report Status 09/16/2020 FINAL  Final  SARS CORONAVIRUS 2 (TAT 6-24 HRS) Nasopharyngeal Nasopharyngeal Swab     Status: None   Collection Time: 09/15/20  2:34 PM   Specimen: Nasopharyngeal Swab  Result Value Ref Range Status   SARS Coronavirus 2 NEGATIVE NEGATIVE Final    Comment: (NOTE) SARS-CoV-2 target nucleic acids are NOT DETECTED.  The SARS-CoV-2 RNA is generally detectable in upper and lower respiratory specimens during the acute phase of infection. Negative results do not preclude SARS-CoV-2 infection, do not rule out co-infections with other pathogens, and should not be used as the sole basis for treatment or other patient management decisions. Negative results must be combined with clinical observations, patient history, and epidemiological information. The expected result is Negative.  Fact Sheet for Patients: SugarRoll.be  Fact Sheet for Healthcare Providers: https://www.woods-mathews.com/  This test is not yet approved or cleared by the [place] FDA and  has been authorized for detection and/or diagnosis of SARS-CoV-2 by FDA under an Emergency Use Authorization (EUA). This EUA will remain  in effect (meaning this test can be used) for the duration of the COVID-19 declaration under Se ction 564(b)(1) of the Act, 21 U.S.C. section 360bbb-3(b)(1), unless the authorization is terminated or revoked sooner.  Performed at [facility], [place]  [address] [place], [place] [address]   Blood culture (routine x 2)     Status: None (Preliminary result)   Collection Time: 09/23/20  9:23 PM   Specimen: BLOOD RIGHT HAND  Result Value Ref Range Status   Specimen Description BLOOD RIGHT HAND  Final   Special Requests   Final    BOTTLES DRAWN AEROBIC AND ANAEROBIC Blood Culture adequate volume   Culture  Setup Time   Final    GRAM NEGATIVE RODS IN BOTH AEROBIC AND ANAEROBIC BOTTLES CRITICAL VALUE NOTED.  VALUE IS CONSISTENT WITH PREVIOUSLY REPORTED AND CALLED VALUE. Performed at [facility], [place] [address], [place], [place] [address]    Culture GRAM NEGATIVE RODS  Final   Report Status PENDING  Incomplete  Blood culture (routine x 2)     Status: Abnormal (Preliminary result)   Collection Time: 09/23/20  9:23 PM   Specimen: BLOOD  Result Value Ref Range Status   Specimen Description BLOOD RIGHT ANTECUBITAL  Final   Special Requests   Final    BOTTLES DRAWN AEROBIC AND ANAEROBIC Blood Culture results may not be optimal due to an excessive volume of blood received in culture bottles   Culture  Setup Time   Final    GRAM NEGATIVE RODS IN BOTH AEROBIC AND ANAEROBIC BOTTLES Organism ID to follow CRITICAL RESULT CALLED TO, READ BACK BY AND VERIFIED WITH: Sharen Heck PharmD 11:10 09/24/20 ([email or web address])    Culture (A)  Final    ESCHERICHIA COLI SUSCEPTIBILITIES TO FOLLOW Performed at [facility], [place] [address], [place], [place] [address]    Report Status PENDING  Incomplete  Blood Culture ID Panel (Reflexed)     Status: Abnormal   Collection Time: 09/23/20  9:23 PM  Result Value Ref Range Status   Enterococcus faecalis NOT DETECTED NOT DETECTED Final   Enterococcus Faecium NOT DETECTED NOT DETECTED Final   Listeria monocytogenes NOT DETECTED NOT DETECTED Final   Staphylococcus species NOT DETECTED NOT DETECTED Final   Staphylococcus aureus (BCID) NOT DETECTED NOT DETECTED Final   Staphylococcus epidermidis NOT DETECTED NOT  DETECTED Final   Staphylococcus lugdunensis NOT DETECTED NOT DETECTED Final   Streptococcus species NOT DETECTED NOT DETECTED Final   Streptococcus agalactiae NOT DETECTED NOT DETECTED Final   Streptococcus pneumoniae NOT DETECTED NOT DETECTED Final   Streptococcus pyogenes NOT DETECTED NOT DETECTED Final   A.calcoaceticus-baumannii NOT DETECTED NOT DETECTED Final   Bacteroides fragilis NOT DETECTED NOT DETECTED Final   Enterobacterales DETECTED (A) NOT DETECTED Final    Comment: Enterobacterales represent a large order of gram negative bacteria, not a single organism. CRITICAL RESULT CALLED TO, READ BACK BY AND VERIFIED WITH: Sharen Heck PharmD 11:10 09/24/20 ([email or web address])    Enterobacter cloacae complex NOT DETECTED NOT DETECTED Final   Escherichia coli DETECTED (A) NOT DETECTED Final   Klebsiella aerogenes NOT DETECTED NOT DETECTED Final   Klebsiella oxytoca NOT DETECTED NOT DETECTED Final    Comment: CRITICAL RESULT CALLED TO, READ BACK BY AND VERIFIED WITH: Sharen Heck PharmD 11:15 09/24/20 ([email or web address])    Klebsiella pneumoniae NOT DETECTED NOT DETECTED Final   Proteus species NOT DETECTED NOT DETECTED Final   Salmonella species NOT DETECTED NOT DETECTED Final   Serratia marcescens NOT DETECTED NOT DETECTED Final   Haemophilus influenzae NOT DETECTED NOT DETECTED Final   Neisseria meningitidis NOT DETECTED NOT DETECTED Final   Pseudomonas aeruginosa NOT DETECTED NOT DETECTED Final   Stenotrophomonas maltophilia NOT DETECTED NOT DETECTED Final   Candida albicans NOT DETECTED NOT DETECTED Final   Candida auris NOT DETECTED NOT DETECTED Final   Candida glabrata NOT DETECTED NOT DETECTED Final   Candida krusei NOT DETECTED NOT DETECTED Final   Candida parapsilosis NOT DETECTED NOT DETECTED Final   Candida tropicalis NOT DETECTED NOT DETECTED Final   Cryptococcus neoformans/gattii NOT DETECTED NOT DETECTED Final   CTX-M ESBL NOT DETECTED NOT DETECTED Final   Carbapenem resistance IMP  NOT DETECTED NOT DETECTED Final   Carbapenem resistance KPC NOT DETECTED NOT DETECTED Final  Carbapenem resistance NDM NOT DETECTED NOT DETECTED Final   Carbapenem resist OXA 48 LIKE NOT DETECTED NOT DETECTED Final   Carbapenem resistance VIM NOT DETECTED NOT DETECTED Final    Comment: Performed at [facility], [place] [address] [place], [place] [address]  Resp Panel by RT-PCR (Flu A&B, Covid) Nasopharyngeal Swab     Status: None   Collection Time: 09/23/20 10:00 PM   Specimen: Nasopharyngeal Swab; Nasopharyngeal(NP) swabs in vial transport medium  Result Value Ref Range Status   SARS Coronavirus 2 by RT PCR NEGATIVE NEGATIVE Final    Comment: (NOTE) SARS-CoV-2 target nucleic acids are NOT DETECTED.  The SARS-CoV-2 RNA is generally detectable in upper respiratory specimens during the acute phase of infection. The lowest concentration of SARS-CoV-2 viral copies this assay can detect is 138 copies/mL. A negative result does not preclude SARS-Cov-2 infection and should not be used as the sole basis for treatment or other patient management decisions. A negative result may occur with  improper specimen collection/handling, submission of specimen other than nasopharyngeal swab, presence of viral mutation(s) within the areas targeted by this assay, and inadequate number of viral copies(<138 copies/mL). A negative result must be combined with clinical observations, patient history, and epidemiological information. The expected result is Negative.  Fact Sheet for Patients:  EntrepreneurPulse.com.au  Fact Sheet for Healthcare Providers:  IncredibleEmployment.be  This test is no t yet approved or cleared by the [place] FDA and  has been authorized for detection and/or diagnosis of SARS-CoV-2 by FDA under an Emergency Use Authorization (EUA). This EUA will remain  in effect (meaning this test can be used) for the duration of the COVID-19  declaration under Section 564(b)(1) of the Act, 21 U.S.C.section 360bbb-3(b)(1), unless the authorization is terminated  or revoked sooner.       Influenza A by PCR NEGATIVE NEGATIVE Final   Influenza B by PCR NEGATIVE NEGATIVE Final    Comment: (NOTE) The Xpert Xpress SARS-CoV-2/FLU/RSV plus assay is intended as an aid in the diagnosis of influenza from Nasopharyngeal swab specimens and should not be used as a sole basis for treatment. Nasal washings and aspirates are unacceptable for Xpert Xpress SARS-CoV-2/FLU/RSV testing.  Fact Sheet for Patients: EntrepreneurPulse.com.au  Fact Sheet for Healthcare Providers: IncredibleEmployment.be  This test is not yet approved or cleared by the [place] FDA and has been authorized for detection and/or diagnosis of SARS-CoV-2 by FDA under an Emergency Use Authorization (EUA). This EUA will remain in effect (meaning this test can be used) for the duration of the COVID-19 declaration under Section 564(b)(1) of the Act, 21 U.S.C. section 360bbb-3(b)(1), unless the authorization is terminated or revoked.  Performed at [facility], [place] [address], [place], [place] [address]       Studies: ECHOCARDIOGRAM COMPLETE  Result Date: 09/24/2020    ECHOCARDIOGRAM REPORT   Patient Name:   URI TURNBOUGH Date of Exam: 09/24/2020 Medical Rec #:  [id number]         Height:       72.0 in Accession #:    [id number]        Weight:       290.7 lb Date of Birth:  1966-03-03          BSA:          2.498 m Patient Age:    67 years          BP:           113/70  mmHg Patient Gender: M                 HR:           109 bpm. Exam Location:  Inpatient Procedure: 2D Echo Indications:    Bacteremia R78.81  History:        Patient has prior history of Echocardiogram examinations, most                 recent 08/18/2019. Signs/Symptoms:Bacteremia and Chest Pain; Risk                 Factors:Diabetes, Hypertension, Non-Smoker and  Dyslipidemia.  Sonographer:    Leavy Cella Referring Phys: [phone] RONDELL A SMITH IMPRESSIONS  1. Left ventricular ejection fraction, by estimation, is 70 to 75%. The left ventricle has hyperdynamic function. The left ventricle has no regional wall motion abnormalities. There is moderate concentric left ventricular hypertrophy. Left ventricular diastolic parameters were normal.  2. Right ventricular systolic function is normal. The right ventricular size is normal. Tricuspid regurgitation signal is inadequate for assessing PA pressure.  3. The mitral valve is grossly normal. No evidence of mitral valve regurgitation. No evidence of mitral stenosis.  4. The aortic valve has an indeterminant number of cusps. There is moderate calcification of the aortic valve. There is moderate thickening of the aortic valve. Aortic valve regurgitation is not visualized.  5. The inferior vena cava is normal in size with greater than 50% respiratory variability, suggesting right atrial pressure of 3 mmHg. Comparison(s): Changes from prior study are noted. Conclusion(s)/Recommendation(s): Since prior study, aortic stenosis is now severe, with peak velocity >4 m/s, mean gradient 42 mmHg. FINDINGS  Left Ventricle: Left ventricular ejection fraction, by estimation, is 70 to 75%. The left ventricle has hyperdynamic function. The left ventricle has no regional wall motion abnormalities. Definity contrast agent was given IV to delineate the left ventricular endocardial borders. The left ventricular internal cavity size was normal in size. There is moderate concentric left ventricular hypertrophy. Left ventricular diastolic parameters were normal. Right Ventricle: The right ventricular size is normal. No increase in right ventricular wall thickness. Right ventricular systolic function is normal. Tricuspid regurgitation signal is inadequate for assessing PA pressure. Left Atrium: Left atrial size was normal in size. Right Atrium: Right  atrial size was normal in size. Pericardium: There is no evidence of pericardial effusion. Mitral Valve: The mitral valve is grossly normal. Mild to moderate mitral annular calcification. No evidence of mitral valve regurgitation. No evidence of mitral valve stenosis. Tricuspid Valve: The tricuspid valve is normal in structure. Tricuspid valve regurgitation is not demonstrated. No evidence of tricuspid stenosis. Aortic Valve: The aortic valve has an indeterminant number of cusps. There is moderate calcification of the aortic valve. There is moderate thickening of the aortic valve. Aortic valve regurgitation is not visualized. Aortic valve mean gradient measures 41.7 mmHg. Aortic valve peak gradient measures 69.9 mmHg. Aortic valve area, by VTI measures 1.09 cm. Pulmonic Valve: The pulmonic valve was not well visualized. Pulmonic valve regurgitation is not visualized. Aorta: The aortic root and ascending aorta are structurally normal, with no evidence of dilitation. Venous: The inferior vena cava is normal in size with greater than 50% respiratory variability, suggesting right atrial pressure of 3 mmHg. IAS/Shunts: The atrial septum is grossly normal.  LEFT VENTRICLE PLAX 2D LVIDd:         3.90 cm  Diastology LVIDs:         2.00 cm  LV e' medial:  4.68 cm/s LV PW:         1.75 cm  LV e' lateral: 11.50 cm/s LV IVS:        1.65 cm LVOT diam:     2.20 cm LV SV:         73 LV SV Index:   29 LVOT Area:     3.80 cm  RIGHT VENTRICLE TAPSE (M-mode): 3.0 cm LEFT ATRIUM             Index       RIGHT ATRIUM           Index LA diam:        3.90 cm 1.56 cm/m  RA Area:     10.50 cm LA Vol (A2C):   42.4 ml 16.98 ml/m RA Volume:   21.70 ml  8.69 ml/m LA Vol (A4C):   56.1 ml 22.46 ml/m LA Biplane Vol: 50.7 ml 20.30 ml/m  AORTIC VALVE AV Area (Vmax):    1.25 cm AV Area (Vmean):   0.96 cm AV Area (VTI):     1.09 cm AV Vmax:           418.00 cm/s AV Vmean:          291.667 cm/s AV VTI:            0.675 m AV Peak Grad:       69.9 mmHg AV Mean Grad:      41.7 mmHg LVOT Vmax:         137.33 cm/s LVOT Vmean:        73.333 cm/s LVOT VTI:          0.193 m LVOT/AV VTI ratio: 0.29  AORTA Ao Root diam: 2.50 cm MV A velocity: 143.00 cm/s                             SHUNTS                             Systemic VTI:  0.19 m                             Systemic Diam: 2.20 cm Buford Dresser MD Electronically signed by Buford Dresser MD Signature Date/Time: 09/24/2020/7:56:44 PM    Final     Scheduled Meds: . aspirin EC  81 mg Oral Daily  . enoxaparin (LOVENOX) injection  40 mg Subcutaneous Daily  . insulin aspart  0-20 Units Subcutaneous TID WC  . insulin aspart protamine- aspart  30 Units Subcutaneous BID WC  . lidocaine  1 patch Transdermal Daily  . metoprolol tartrate  50 mg Oral BID  . sodium chloride flush  3 mL Intravenous Q12H    Continuous Infusions: . sodium chloride 125 mL/hr at 09/25/20 0827  . cefTRIAXone (ROCEPHIN)  IV Stopped (09/24/20 1436)     LOS: 1 day     Kayleen Memos, MD [facility] Pager [phone]  If 7PM-7AM, please contact night-coverage [email or web address] Password [facility] 09/25/2020, 11:24 AM

## 2020-09-26 ENCOUNTER — Inpatient Hospital Stay (HOSPITAL_COMMUNITY): Payer: Self-pay

## 2020-09-26 ENCOUNTER — Encounter (HOSPITAL_COMMUNITY): Payer: Self-pay | Admitting: Family Medicine

## 2020-09-26 DIAGNOSIS — I350 Nonrheumatic aortic (valve) stenosis: Secondary | ICD-10-CM

## 2020-09-26 LAB — COMPREHENSIVE METABOLIC PANEL
ALT: 42 U/L (ref 0–44)
AST: 69 U/L — ABNORMAL HIGH (ref 15–41)
Albumin: 2.1 g/dL — ABNORMAL LOW (ref 3.5–5.0)
Alkaline Phosphatase: 134 U/L — ABNORMAL HIGH (ref 38–126)
Anion gap: 6 (ref 5–15)
BUN: 18 mg/dL (ref 6–20)
CO2: 23 mmol/L (ref 22–32)
Calcium: 7.9 mg/dL — ABNORMAL LOW (ref 8.9–10.3)
Chloride: 105 mmol/L (ref 98–111)
Creatinine, Ser: 1.67 mg/dL — ABNORMAL HIGH (ref 0.61–1.24)
GFR, Estimated: 48 mL/min — ABNORMAL LOW (ref >60)
Glucose, Bld: 149 mg/dL — ABNORMAL HIGH (ref 70–99)
Potassium: 4.2 mmol/L (ref 3.5–5.1)
Sodium: 134 mmol/L — ABNORMAL LOW (ref 135–145)
Total Bilirubin: 0.6 mg/dL (ref 0.3–1.2)
Total Protein: 6.1 g/dL — ABNORMAL LOW (ref 6.5–8.1)

## 2020-09-26 LAB — GLUCOSE, CAPILLARY
Glucose-Capillary: 171 mg/dL — ABNORMAL HIGH (ref 70–99)
Glucose-Capillary: 171 mg/dL — ABNORMAL HIGH (ref 70–99)
Glucose-Capillary: 233 mg/dL — ABNORMAL HIGH (ref 70–99)
Glucose-Capillary: 198 mg/dL — ABNORMAL HIGH (ref 70–99)

## 2020-09-26 LAB — CBC
HCT: 32.7 % — ABNORMAL LOW (ref 39.0–52.0)
HCT: 31.3 % — ABNORMAL LOW (ref 39.0–52.0)
Hemoglobin: 11.4 g/dL — ABNORMAL LOW (ref 13.0–17.0)
Hemoglobin: 10.8 g/dL — ABNORMAL LOW (ref 13.0–17.0)
MCH: 25.7 pg — ABNORMAL LOW (ref 26.0–34.0)
MCH: 25.4 pg — ABNORMAL LOW (ref 26.0–34.0)
MCHC: 34.9 g/dL (ref 30.0–36.0)
MCHC: 34.5 g/dL (ref 30.0–36.0)
MCV: 73.8 fL — ABNORMAL LOW (ref 80.0–100.0)
MCV: 73.6 fL — ABNORMAL LOW (ref 80.0–100.0)
Platelets: 89 10*3/uL — ABNORMAL LOW (ref 150–400)
Platelets: 87 10*3/uL — ABNORMAL LOW (ref 150–400)
RBC: 4.43 MIL/uL (ref 4.22–5.81)
RBC: 4.25 MIL/uL (ref 4.22–5.81)
RDW: 14.6 % (ref 11.5–15.5)
RDW: 14.6 % (ref 11.5–15.5)
WBC: 10.3 10*3/uL (ref 4.0–10.5)
WBC: 9.7 10*3/uL (ref 4.0–10.5)
nRBC: 0 % (ref 0.0–0.2)
nRBC: 0 % (ref 0.0–0.2)

## 2020-09-26 LAB — IRON AND TIBC
Iron: 12 ug/dL — ABNORMAL LOW (ref 45–182)
Saturation Ratios: 7 % — ABNORMAL LOW (ref 17.9–39.5)
TIBC: 174 ug/dL — ABNORMAL LOW (ref 250–450)
UIBC: 162 ug/dL

## 2020-09-26 LAB — PROCALCITONIN: Procalcitonin: 59.91 ng/mL

## 2020-09-26 LAB — FERRITIN: Ferritin: 1466 ng/mL — ABNORMAL HIGH (ref 24–336)

## 2020-09-26 LAB — FOLATE: Folate: 11.2 ng/mL (ref >5.9)

## 2020-09-26 LAB — VITAMIN B12: Vitamin B-12: 978 pg/mL — ABNORMAL HIGH (ref 180–914)

## 2020-09-26 LAB — MRSA PCR SCREENING: MRSA by PCR: NEGATIVE

## 2020-09-26 MED ORDER — METHOCARBAMOL 500 MG PO TABS
500.0000 mg | ORAL_TABLET | Freq: Three times a day (TID) | ORAL | Status: DC | PRN
  Administered 2020-09-26 – 2020-09-28 (×2): 500 mg via ORAL
  Filled 2020-09-26 (×2): qty 1

## 2020-09-26 MED ORDER — SODIUM CHLORIDE 0.9 % IV SOLN: INTRAVENOUS | Status: AC
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
## 2020-09-26 NOTE — Progress Notes (Signed)
PROGRESS NOTE  Riley Hood JKK:[id number] DOB: [birth year] DOA: 09/23/2020 PCP: Vevelyn Francois, NP  HPI/Recap of past 24 hours:  HPI: Riley Hood is a 55 y.o. 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 Patient had just been recently hospitalized from 3/1-3/3 with diabetic ketoacidosis which resolved with IV fluids and drip.  He was also noted to have signs of peripheral neuropathy and started on gabapentin.  After getting home patient reports that he was doing okay and has been taking medications as advised.  Symptoms started after he had been outside raking the yard a couple days ago.  Initially thought that he may have pulled a muscle.  When he went to stand up had a sharp pain in the lower part of his back that radiated down his left leg.  Noted associated symptoms of some numbness down the left leg as well.  ED Course: Febrile up to 103.8 F, pulse 102-139, respirations 13-29, blood pressure 90/57-141/127, and O2 saturations 89-97% currently on room air.  Labs from 3/9 significant for WBC 9.7, sodium 129, BUN 13, creatinine 1.5, and glucose 361.  Urinalysis was significant for moderate hemoglobin, glucose, ketones, and no bacteria.  Influenza and COVID-19 screening were negative.  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  Patient has been given full fluid bolus with IV vancomycin, Zosyn, acetaminophen, and hydromorphone.  MRI of the pelvis completed on 09/26/2020 and showed no evidence of acute osteomyelitis or septic arthritis involving the bilateral hips or pelvis, partial thickness tearing of the left adductor tendon insertion on the pubic tubercle.  09/26/20: Seen and examined  at his bedside this morning.  More alert and interactive this morning.  Mainly complains of lower back pain.  Worked with PT with recommendation for home health PT.  Procalcitonin elevated greater than 50.  Fever with T-max 100.5.  He is currently on Rocephin 2 g daily.  MRSA screening was negative.  Assessment/Plan: Principal Problem:   Sepsis ([place]) Active Problems:   Type 2 diabetes mellitus with other specified complication (HCC)   AKI (acute kidney injury) ([place])   Bacterial infection due to E. coli   Bacteremia due to Escherichia coli  Sepsis secondary to E. coli bacteremia, unclear source.   Presented with complaints of lower back pain  On presentation febrile with T-max 103.9, tachycardia and tachypnea  Blood cultures positive for E. coli x2/2 bottles.  Resistant to ampicillin and Bactrim.  Intermediate sensitivity for ampicillin/sulbactam. Repeat blood cultures taken on 09/25/2020 - to date. Continue Rocephin 2 g daily. Continue to monitor fever curve and WBC. Procalcitonin 59.9 on 09/26/2020  Severe aortic stenosis seen on 2D echo done on 09/24/2020 2D echo done on 09/24/2020 showed LVEF 70 to 75% with moderate thickening and calcification of the aortic valve.  Now severe aortic stenosis. Cardiology consulted to assist with the management  Worsening AKI Creatinine at baseline 0.9 with GFR greater than 60 Presented with creatinine of 1.4 with GFR 58. Creatinine is uptrending 1.6 from 1.4. He is currently on gentle IV fluid hydration normal saline at 75 cc/h. Continue to avoid nephrotoxic agents Continue to monitor urine output Last urine output recorded at 1.0 L in the last 24 hours.  Repeat BMP in the morning.  Acute thrombocytopenia, unclear etiology Platelet count 89K this morning from 121K Repeat CBC with differential  Chronic microcytic anemia Appears to be at his baseline hemoglobin 11.4 with MCV 73. Obtain iron studies Monitor H&H   Type 2 diabetes with  hyperglycemia Hemoglobin A1c 13.1 on 09/15/2020 He is currently on 70/30 insulin 30 units twice daily, insulin sliding scale.   Continue to monitor CBGs  Essential hypertension, BPs are currently soft. Currently on metoprolol tartrate 50 mg twice daily. Continue to monitor vital signs.  Resolved sinus tachycardia TSH 1.289. Continue metoprolol tartrate 50 mg twice daily. IV Lopressor as needed with parameters.  Improving hypovolemic hyponatremia  Presented with serum sodium 129 Serum sodium 134, on normal saline. Encourage oral intake.   Elevated liver chemistries Alkaline phosphatase acutely elevated 134 AST downtrending Avoid hepatotoxic agents. Continue to monitor  Chronic lower back pain No acute bony abnormality on CT abdomen and pelvis with contrast done on 09/24/2020. MRI lumbar spine and pelvic spine, results as stated above. Continue analgesics.   Physical debility/generalized weakness suspect in the setting of acute illness and sepsis. Non focal Assessed by PT recommendation for home health PT    DVT prophylaxis: Lovenox subcu daily Code Status: Full Family Communication:  Called his sister with his permission [phone], Posey Pronto.  No answer, left a voicemail message. Disposition Plan: Hopefully discharge home once medically stable Consults called:  Cardiology.   Procedures:  2D echo on 09/24/2020.  Antimicrobials:  Rocephin  DVT prophylaxis: Subcu Lovenox daily.  Status is: Inpatient   Dispo: The patient is from: Home.              Anticipated d/c is to: Home.               Patient currently not stable for discharge due to ongoing treatment of sepsis secondary to E. coli bacteremia.   Difficult to place patient: Not applicable.        Objective: Vitals:   09/26/20 0803 09/26/20 1006 09/26/20 1006 09/26/20 1253  BP: 113/74 115/67 115/67 119/69  Pulse: (!) 108 100 99 97  Resp:  16 16 16   Temp:  97.9 F (36.6 C) 97.9 F (36.6 C) (!)  100.5 F (38.1 C)  TempSrc:  Oral Oral Oral  SpO2:   93% 95%  Weight:      Height:        Intake/Output Summary (Last 24 hours) at 09/26/2020 1423 Last data filed at 09/26/2020 1332 Gross per 24 hour  Intake 818.36 ml  Output 1125 ml  Net -306.64 ml   Filed Weights   09/25/20 1400  Weight: 131.5 kg    Exam:  . General: 55 y.o. year-old male well-developed well-nourished chronically ill-appearing, weak appearing.  Alert oriented x3.   . Cardiovascular: Regular rate and rhythm no rubs or gallops. Marland Kitchen Respiratory: Clear to auscultation no wheezes or rales.   . Abdomen: Soft nontender normal bowel sounds present.. . Musculoskeletal: Trace upper and lower extremity edema bilaterally.  .  Skin: Dry skin all throughout. Marland Kitchen Psychiatry: Mood is appropriate for condition and setting.   Data Reviewed: CBC: Recent Labs  Lab 09/23/20 1940 09/23/20 1956 09/24/20 2059 09/25/20 0053 09/26/20 0200  WBC 9.7  --  8.8 11.0* 10.3  NEUTROABS 8.4*  --  7.8*  --   --   HGB 14.5 15.6 12.1* 11.7* 11.4*  HCT 41.4 46.0 34.9* 33.6* 32.7*  MCV 74.9*  --  74.3* 74.3* 73.8*  PLT  212  --  146* 121* 89*   Basic Metabolic Panel: Recent Labs  Lab 09/23/20 1940 09/23/20 1956 09/24/20 2059 09/25/20 0053 09/26/20 0200  NA 129* 135 135 131* 134*  K 4.8 4.9 4.2 3.7 4.2  CL 95* 96* 105 102 105  CO2 22  --  23 21* 23  GLUCOSE 361* 367* 232* 219* 149*  BUN 13 15 18 17 18   CREATININE 1.50* 1.40* 1.41* 1.43* 1.67*  CALCIUM 9.1  --  7.9* 7.9* 7.9*   GFR: Estimated Creatinine Clearance: 70.1 mL/min (A) (by C-G formula based on SCr of 1.67 mg/dL (H)). Liver Function Tests: Recent Labs  Lab 09/23/20 2319 09/25/20 0053 09/26/20 0200  AST 50* 77* 69*  ALT 36 43 42  ALKPHOS 63 75 134*  BILITOT 0.7 0.6 0.6  PROT 6.5 5.8* 6.1*  ALBUMIN 2.7* 2.4* 2.1*   No results for input(s): LIPASE, AMYLASE in the last 168 hours. No results for input(s): AMMONIA in the last 168 hours. Coagulation Profile: No  results for input(s): INR, PROTIME in the last 168 hours. Cardiac Enzymes: No results for input(s): CKTOTAL, CKMB, CKMBINDEX, TROPONINI in the last 168 hours. BNP (last 3 results) No results for input(s): PROBNP in the last 8760 hours. HbA1C: No results for input(s): HGBA1C in the last 72 hours. CBG: Recent Labs  Lab 09/25/20 1121 09/25/20 1608 09/25/20 2132 09/26/20 0605 09/26/20 1112  GLUCAP 294* 271* 128* 171* 171*   Lipid Profile: No results for input(s): CHOL, HDL, LDLCALC, TRIG, CHOLHDL, LDLDIRECT in the last 72 hours. Thyroid Function Tests: Recent Labs    09/25/20 1800  TSH 1.289   Anemia Panel: No results for input(s): VITAMINB12, FOLATE, FERRITIN, TIBC, IRON, RETICCTPCT in the last 72 hours. Urine analysis:    Component Value Date/Time   COLORURINE AMBER (A) 09/24/2020 0223   APPEARANCEUR HAZY (A) 09/24/2020 0223   LABSPEC 1.021 09/24/2020 0223   PHURINE 5.0 09/24/2020 0223   GLUCOSEU >=500 (A) 09/24/2020 0223   HGBUR MODERATE (A) 09/24/2020 0223   BILIRUBINUR NEGATIVE 09/24/2020 0223   BILIRUBINUR neg 10/07/2019 0951   KETONESUR 20 (A) 09/24/2020 0223   PROTEINUR 30 (A) 09/24/2020 0223   UROBILINOGEN 0.2 09/15/2020 1032   NITRITE NEGATIVE 09/24/2020 0223   LEUKOCYTESUR NEGATIVE 09/24/2020 0223   Sepsis Labs: @LABRCNTIP (procalcitonin:4,lacticidven:4)  ) Recent Results (from the past 240 hour(s))  Blood culture (routine x 2)     Status: Abnormal (Preliminary result)   Collection Time: 09/23/20  9:23 PM   Specimen: BLOOD RIGHT HAND  Result Value Ref Range Status   Specimen Description BLOOD RIGHT HAND  Final   Special Requests   Final    BOTTLES DRAWN AEROBIC AND ANAEROBIC Blood Culture adequate volume   Culture  Setup Time   Final    GRAM NEGATIVE RODS IN BOTH AEROBIC AND ANAEROBIC BOTTLES CRITICAL VALUE NOTED.  VALUE IS CONSISTENT WITH PREVIOUSLY REPORTED AND CALLED VALUE.    Culture (A)  Final    ESCHERICHIA COLI SUSCEPTIBILITIES PERFORMED ON  PREVIOUS CULTURE WITHIN THE LAST 5 DAYS. Performed at [facility], [place] [address], [place], [place] [address]    Report Status PENDING  Incomplete  Blood culture (routine x 2)     Status: Abnormal (Preliminary result)   Collection Time: 09/23/20  9:23 PM   Specimen: BLOOD  Result Value Ref Range Status   Specimen Description BLOOD RIGHT ANTECUBITAL  Final   Special Requests   Final    BOTTLES DRAWN AEROBIC AND ANAEROBIC  Blood Culture results may not be optimal due to an excessive volume of blood received in culture bottles   Culture  Setup Time   Final    GRAM NEGATIVE RODS IN BOTH AEROBIC AND ANAEROBIC BOTTLES CRITICAL RESULT CALLED TO, READ BACK BY AND VERIFIED WITH: Sharen Heck PharmD 11:10 09/24/20 ([email or web address]) Performed at [facility], [place] [address] [place], [place] [address]    Culture ESCHERICHIA COLI (A)  Final   Report Status PENDING  Incomplete   Organism ID, Bacteria ESCHERICHIA COLI  Final      Susceptibility   Escherichia coli - MIC*    AMPICILLIN >=32 RESISTANT Resistant     CEFAZOLIN <=4 SENSITIVE Sensitive     CEFEPIME <=0.12 SENSITIVE Sensitive     CEFTAZIDIME <=1 SENSITIVE Sensitive     CEFTRIAXONE <=0.25 SENSITIVE Sensitive     CIPROFLOXACIN <=0.25 SENSITIVE Sensitive     GENTAMICIN <=1 SENSITIVE Sensitive     IMIPENEM <=0.25 SENSITIVE Sensitive     TRIMETH/SULFA >=320 RESISTANT Resistant     AMPICILLIN/SULBACTAM 16 INTERMEDIATE Intermediate     PIP/TAZO <=4 SENSITIVE Sensitive     * ESCHERICHIA COLI  Blood Culture ID Panel (Reflexed)     Status: Abnormal   Collection Time: 09/23/20  9:23 PM  Result Value Ref Range Status   Enterococcus faecalis NOT DETECTED NOT DETECTED Final   Enterococcus Faecium NOT DETECTED NOT DETECTED Final   Listeria monocytogenes NOT DETECTED NOT DETECTED Final   Staphylococcus species NOT DETECTED NOT DETECTED Final   Staphylococcus aureus (BCID) NOT DETECTED NOT DETECTED Final   Staphylococcus epidermidis NOT  DETECTED NOT DETECTED Final   Staphylococcus lugdunensis NOT DETECTED NOT DETECTED Final   Streptococcus species NOT DETECTED NOT DETECTED Final   Streptococcus agalactiae NOT DETECTED NOT DETECTED Final   Streptococcus pneumoniae NOT DETECTED NOT DETECTED Final   Streptococcus pyogenes NOT DETECTED NOT DETECTED Final   A.calcoaceticus-baumannii NOT DETECTED NOT DETECTED Final   Bacteroides fragilis NOT DETECTED NOT DETECTED Final   Enterobacterales DETECTED (A) NOT DETECTED Final    Comment: Enterobacterales represent a large order of gram negative bacteria, not a single organism. CRITICAL RESULT CALLED TO, READ BACK BY AND VERIFIED WITH: Sharen Heck PharmD 11:10 09/24/20 ([email or web address])    Enterobacter cloacae complex NOT DETECTED NOT DETECTED Final   Escherichia coli DETECTED (A) NOT DETECTED Final   Klebsiella aerogenes NOT DETECTED NOT DETECTED Final   Klebsiella oxytoca NOT DETECTED NOT DETECTED Final    Comment: CRITICAL RESULT CALLED TO, READ BACK BY AND VERIFIED WITH: Sharen Heck PharmD 11:15 09/24/20 ([email or web address])    Klebsiella pneumoniae NOT DETECTED NOT DETECTED Final   Proteus species NOT DETECTED NOT DETECTED Final   Salmonella species NOT DETECTED NOT DETECTED Final   Serratia marcescens NOT DETECTED NOT DETECTED Final   Haemophilus influenzae NOT DETECTED NOT DETECTED Final   Neisseria meningitidis NOT DETECTED NOT DETECTED Final   Pseudomonas aeruginosa NOT DETECTED NOT DETECTED Final   Stenotrophomonas maltophilia NOT DETECTED NOT DETECTED Final   Candida albicans NOT DETECTED NOT DETECTED Final   Candida auris NOT DETECTED NOT DETECTED Final   Candida glabrata NOT DETECTED NOT DETECTED Final   Candida krusei NOT DETECTED NOT DETECTED Final   Candida parapsilosis NOT DETECTED NOT DETECTED Final   Candida tropicalis NOT DETECTED NOT DETECTED Final   Cryptococcus neoformans/gattii NOT DETECTED NOT DETECTED Final   CTX-M ESBL NOT DETECTED NOT DETECTED Final   Carbapenem  resistance IMP NOT DETECTED NOT DETECTED Final  Carbapenem resistance KPC NOT DETECTED NOT DETECTED Final   Carbapenem resistance NDM NOT DETECTED NOT DETECTED Final   Carbapenem resist OXA 48 LIKE NOT DETECTED NOT DETECTED Final   Carbapenem resistance VIM NOT DETECTED NOT DETECTED Final    Comment: Performed at [facility], [place] [address] [place], [place] [address]  Resp Panel by RT-PCR (Flu A&B, Covid) Nasopharyngeal Swab     Status: None   Collection Time: 09/23/20 10:00 PM   Specimen: Nasopharyngeal Swab; Nasopharyngeal(NP) swabs in vial transport medium  Result Value Ref Range Status   SARS Coronavirus 2 by RT PCR NEGATIVE NEGATIVE Final    Comment: (NOTE) SARS-CoV-2 target nucleic acids are NOT DETECTED.  The SARS-CoV-2 RNA is generally detectable in upper respiratory specimens during the acute phase of infection. The lowest concentration of SARS-CoV-2 viral copies this assay can detect is 138 copies/mL. A negative result does not preclude SARS-Cov-2 infection and should not be used as the sole basis for treatment or other patient management decisions. A negative result may occur with  improper specimen collection/handling, submission of specimen other than nasopharyngeal swab, presence of viral mutation(s) within the areas targeted by this assay, and inadequate number of viral copies(<138 copies/mL). A negative result must be combined with clinical observations, patient history, and epidemiological information. The expected result is Negative.  Fact Sheet for Patients:  EntrepreneurPulse.com.au  Fact Sheet for Healthcare Providers:  IncredibleEmployment.be  This test is no t yet approved or cleared by the [place] FDA and  has been authorized for detection and/or diagnosis of SARS-CoV-2 by FDA under an Emergency Use Authorization (EUA). This EUA will remain  in effect (meaning this test can be used) for the duration of  the COVID-19 declaration under Section 564(b)(1) of the Act, 21 U.S.C.section 360bbb-3(b)(1), unless the authorization is terminated  or revoked sooner.       Influenza A by PCR NEGATIVE NEGATIVE Final   Influenza B by PCR NEGATIVE NEGATIVE Final    Comment: (NOTE) The Xpert Xpress SARS-CoV-2/FLU/RSV plus assay is intended as an aid in the diagnosis of influenza from Nasopharyngeal swab specimens and should not be used as a sole basis for treatment. Nasal washings and aspirates are unacceptable for Xpert Xpress SARS-CoV-2/FLU/RSV testing.  Fact Sheet for Patients: EntrepreneurPulse.com.au  Fact Sheet for Healthcare Providers: IncredibleEmployment.be  This test is not yet approved or cleared by the [place] FDA and has been authorized for detection and/or diagnosis of SARS-CoV-2 by FDA under an Emergency Use Authorization (EUA). This EUA will remain in effect (meaning this test can be used) for the duration of the COVID-19 declaration under Section 564(b)(1) of the Act, 21 U.S.C. section 360bbb-3(b)(1), unless the authorization is terminated or revoked.  Performed at [facility], [place] [address] [place], [place] [address]   Culture, blood (routine x 2)     Status: None (Preliminary result)   Collection Time: 09/25/20  9:29 AM   Specimen: BLOOD  Result Value Ref Range Status   Specimen Description BLOOD LEFT ANTECUBITAL  Final   Special Requests   Final    BOTTLES DRAWN AEROBIC AND ANAEROBIC Blood Culture results may not be optimal due to an inadequate volume of blood received in culture bottles   Culture   Final    NO GROWTH < 12 HOURS Performed at [facility], [place] [address], [place],  [address]    Report Status PENDING  Incomplete  Culture, blood (routine x 2)  Status: None (Preliminary result)   Collection Time: 09/25/20  9:38 AM   Specimen: BLOOD  Result Value Ref Range Status   Specimen Description  BLOOD LEFT ANTECUBITAL  Final   Special Requests   Final    BOTTLES DRAWN AEROBIC ONLY Blood Culture adequate volume   Culture   Final    NO GROWTH < 12 HOURS Performed at [facility], [address] [place], [place] [address]    Report Status PENDING  Incomplete  MRSA PCR Screening     Status: None   Collection Time: 09/26/20  8:00 AM   Specimen: Nasal Mucosa; Nasopharyngeal  Result Value Ref Range Status   MRSA by PCR NEGATIVE NEGATIVE Final    Comment:        The GeneXpert MRSA Assay (FDA approved for NASAL specimens only), is one component of a comprehensive MRSA colonization surveillance program. It is not intended to diagnose MRSA infection nor to guide or monitor treatment for MRSA infections. Performed at [facility], [place] [address], [place], [place] [address]       Studies: MR PELVIS WO CONTRAST  Result Date: 09/26/2020 CLINICAL DATA:  Back pain. Pelvic pain. Diabetes. Osteomyelitis suspected. EXAM: MRI PELVIS WITHOUT CONTRAST TECHNIQUE: Multiplanar multisequence MR imaging of the pelvis was performed. No intravenous contrast was administered. Examination was ordered without and with IV contrast. Patient was unable to tolerate the postcontrast portion of the exam. COMPARISON:  CT pelvis 09/24/2020.  MRI lumbar spine 09/24/2020 FINDINGS: Urinary Tract:  No abnormality visualized. Bowel:  Unremarkable visualized pelvic bowel loops. Vascular/Lymphatic: No pelvic or inguinal lymphadenopathy. No evidence of vascular abnormality by MRI. Reproductive:  No mass or other significant abnormality Other:  Trace free fluid within the pelvis is new from prior CT. Musculoskeletal: Susceptibility artifact related to proximal right femoral ORIF degrades evaluation of the adjacent structures with resulting poor fat saturation. No evidence of acute fracture or dislocation. No femoral head avascular necrosis. No bony erosion or cortical destruction. No bone marrow edema.  Physiologic amount of fluid within the bilateral hip joints without significant effusion. No SI joint effusion. Partial-thickness tearing of the left adductor tendon insertions on the pubic tubercle. Mild tendinosis of the bilateral hamstring tendon origins. Right gluteal tendons are poorly evaluated secondary to adjacent hardware. Intramuscular edema and trace perifascial fluid within the anterior compartment of the proximal left thigh (series 17, image 47). No intramuscular fluid collections. Nonspecific subcutaneous edema overlies the bilateral gluteal regions and proximal bilateral thighs, left greater than right. No organized fluid collection within the soft tissues. IMPRESSION: 1. No evidence of acute osteomyelitis or septic arthritis involving the bilateral hips or pelvis. 2. Intramuscular edema and trace perifascial fluid within the anterior compartment of the proximal left thigh. Findings can be seen in the setting of myositis, infection, or trauma. 3. Nonspecific subcutaneous edema overlies the bilateral gluteal regions and proximal bilateral thighs, left greater than right. No organized fluid collection. 4. Partial-thickness tearing of the left adductor tendon insertion on the pubic tubercle. 5. Mild tendinosis of the bilateral hamstring tendon origins. Electronically Signed   By: Davina Poke D.O.   On: 09/26/2020 11:43    Scheduled Meds: . aspirin EC  81 mg Oral Daily  . enoxaparin (LOVENOX) injection  40 mg Subcutaneous Daily  . insulin aspart  0-20 Units Subcutaneous TID WC  . insulin aspart protamine- aspart  30 Units Subcutaneous BID WC  . lidocaine  1 patch Transdermal Daily  . metoprolol tartrate  50 mg Oral BID  . sodium chloride flush  3 mL Intravenous Q12H    Continuous Infusions: . sodium chloride 75 mL/hr at 09/26/20 1319  . cefTRIAXone (ROCEPHIN)  IV 2 g (09/26/20 1332)     LOS: 2 days     Kayleen Memos, MD [facility] Pager [phone]  If 7PM-7AM,  please contact night-coverage [email or web address] Password [facility] 09/26/2020, 2:23 PM
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
## 2020-09-27 LAB — CULTURE, BLOOD (ROUTINE X 2): Special Requests: ADEQUATE

## 2020-09-27 LAB — CBC WITH DIFFERENTIAL/PLATELET
Abs Immature Granulocytes: 0.06 10*3/uL (ref 0.00–0.07)
Basophils Absolute: 0 10*3/uL (ref 0.0–0.1)
Basophils Relative: 0 %
Eosinophils Absolute: 0 10*3/uL (ref 0.0–0.5)
Eosinophils Relative: 0 %
HCT: 31.3 % — ABNORMAL LOW (ref 39.0–52.0)
Hemoglobin: 10.9 g/dL — ABNORMAL LOW (ref 13.0–17.0)
Immature Granulocytes: 1 %
Lymphocytes Relative: 15 %
Lymphs Abs: 1.4 10*3/uL (ref 0.7–4.0)
MCH: 25.5 pg — ABNORMAL LOW (ref 26.0–34.0)
MCHC: 34.8 g/dL (ref 30.0–36.0)
MCV: 73.1 fL — ABNORMAL LOW (ref 80.0–100.0)
Monocytes Absolute: 1 10*3/uL (ref 0.1–1.0)
Monocytes Relative: 11 %
Neutro Abs: 6.6 10*3/uL (ref 1.7–7.7)
Neutrophils Relative %: 73 %
Platelets: 99 10*3/uL — ABNORMAL LOW (ref 150–400)
RBC: 4.28 MIL/uL (ref 4.22–5.81)
RDW: 14.6 % (ref 11.5–15.5)
WBC: 9 10*3/uL (ref 4.0–10.5)
nRBC: 0 % (ref 0.0–0.2)

## 2020-09-27 LAB — GLUCOSE, CAPILLARY
Glucose-Capillary: 162 mg/dL — ABNORMAL HIGH (ref 70–99)
Glucose-Capillary: 162 mg/dL — ABNORMAL HIGH (ref 70–99)
Glucose-Capillary: 217 mg/dL — ABNORMAL HIGH (ref 70–99)
Glucose-Capillary: 190 mg/dL — ABNORMAL HIGH (ref 70–99)

## 2020-09-27 LAB — COMPREHENSIVE METABOLIC PANEL
ALT: 39 U/L (ref 0–44)
AST: 44 U/L — ABNORMAL HIGH (ref 15–41)
Albumin: 1.9 g/dL — ABNORMAL LOW (ref 3.5–5.0)
Alkaline Phosphatase: 100 U/L (ref 38–126)
Anion gap: 6 (ref 5–15)
BUN: 20 mg/dL (ref 6–20)
CO2: 24 mmol/L (ref 22–32)
Calcium: 8.2 mg/dL — ABNORMAL LOW (ref 8.9–10.3)
Chloride: 105 mmol/L (ref 98–111)
Creatinine, Ser: 1.28 mg/dL — ABNORMAL HIGH (ref 0.61–1.24)
GFR, Estimated: >60 mL/min (ref >60)
Glucose, Bld: 195 mg/dL — ABNORMAL HIGH (ref 70–99)
Potassium: 3.8 mmol/L (ref 3.5–5.1)
Sodium: 135 mmol/L (ref 135–145)
Total Bilirubin: 0.4 mg/dL (ref 0.3–1.2)
Total Protein: 5.7 g/dL — ABNORMAL LOW (ref 6.5–8.1)

## 2020-09-27 LAB — SAVE SMEAR(SSMR), FOR PROVIDER SLIDE REVIEW

## 2020-09-27 LAB — LIPID PANEL
Cholesterol: 134 mg/dL (ref 0–200)
HDL: <10 mg/dL — ABNORMAL LOW (ref >40)
Triglycerides: 258 mg/dL — ABNORMAL HIGH (ref <150)
VLDL: 52 mg/dL — ABNORMAL HIGH (ref 0–40)

## 2020-09-27 LAB — PROCALCITONIN: Procalcitonin: 37.48 ng/mL

## 2020-09-27 MED ORDER — ATORVASTATIN CALCIUM 40 MG PO TABS
40.0000 mg | ORAL_TABLET | Freq: Every day | ORAL | Status: DC
  Administered 2020-09-27 – 2020-10-01 (×5): 40 mg via ORAL
  Filled 2020-09-27 (×5): qty 1

## 2020-09-27 NOTE — Progress Notes (Signed)
Progress Note  Patient Name: Riley Hood Date of Encounter: 09/27/2020  Primary Cardiologist: [name], MD   Subjective   BCX from 3/11 NGTD. Fever overnight. No CP or SOB.  Inpatient Medications    Scheduled Meds: . aspirin EC  81 mg Oral Daily  . enoxaparin (LOVENOX) injection  40 mg Subcutaneous Daily  . insulin aspart  0-20 Units Subcutaneous TID WC  . insulin aspart protamine- aspart  30 Units Subcutaneous BID WC  . lidocaine  1 patch Transdermal Daily  . metoprolol tartrate  50 mg Oral BID  . sodium chloride flush  3 mL Intravenous Q12H   Continuous Infusions: . sodium chloride 50 mL/hr at 09/26/20 2139  . cefTRIAXone (ROCEPHIN)  IV Stopped (09/26/20 1402)   PRN Meds: acetaminophen **OR** acetaminophen, albuterol, HYDROcodone-acetaminophen, methocarbamol, metoprolol tartrate, ondansetron **OR** ondansetron (ZOFRAN) IV   Vital Signs    Vitals:   09/26/20 1606 09/26/20 2033 09/27/20 0003 09/27/20 0447  BP: 132/78 112/82 121/89 122/88  Pulse: (!) 105 93 (!) 102 (!) 104  Resp: 16 18 18 18   Temp: 99.1 F (37.3 C) 97.9 F (36.6 C) 97.8 F (36.6 C) 98.3 F (36.8 C)  TempSrc: Oral Oral Oral Oral  SpO2: 93% 97% 98% 97%  Weight:      Height:        Intake/Output Summary (Last 24 hours) at 09/27/2020 0833 Last data filed at 09/27/2020 0450 Gross per 24 hour  Intake 841.09 ml  Output 2175 ml  Net -1333.91 ml   Filed Weights   09/25/20 1400  Weight: 131.5 kg    Telemetry    Sinus tachycardia rate 100-110 - Personally Reviewed  ECG    No new - Personally Reviewed  Physical Exam   GEN: No acute distress.   Neck: No JVD Cardiac: regular rhythm, normal rate, 3/6 SEM, late peaking, preserved S2. Respiratory: Clear to auscultation bilaterally. GI: Soft, nontender, non-distended  MS: No edema; No deformity. Neuro:  Nonfocal  Psych: Normal affect   Labs    Chemistry Recent Labs  Lab 09/25/20 0053 09/26/20 0200 09/27/20 0121  NA  131* 134* 135  K 3.7 4.2 3.8  CL 102 105 105  CO2 21* 23 24  GLUCOSE 219* 149* 195*  BUN 17 18 20   CREATININE 1.43* 1.67* 1.28*  CALCIUM 7.9* 7.9* 8.2*  PROT 5.8* 6.1* 5.7*  ALBUMIN 2.4* 2.1* 1.9*  AST 77* 69* 44*  ALT 43 42 39  ALKPHOS 75 134* 100  BILITOT 0.6 0.6 0.4  GFRNONAA 58* 48* >60  ANIONGAP 8 6 6      Hematology Recent Labs  Lab 09/26/20 0200 09/26/20 1646 09/27/20 0121  WBC 10.3 9.7 9.0  RBC 4.43 4.25 4.28  HGB 11.4* 10.8* 10.9*  HCT 32.7* 31.3* 31.3*  MCV 73.8* 73.6* 73.1*  MCH 25.7* 25.4* 25.5*  MCHC 34.9 34.5 34.8  RDW 14.6 14.6 14.6  PLT 89* 87* 99*    Cardiac EnzymesNo results for input(s): TROPONINI in the last 168 hours. No results for input(s): TROPIPOC in the last 168 hours.   BNPNo results for input(s): BNP, PROBNP in the last 168 hours.   DDimer No results for input(s): DDIMER in the last 168 hours.   Radiology    MR PELVIS WO CONTRAST  Result Date: 09/26/2020 CLINICAL DATA:  Back pain. Pelvic pain. Diabetes. Osteomyelitis suspected. EXAM: MRI PELVIS WITHOUT CONTRAST TECHNIQUE: Multiplanar multisequence MR imaging of the pelvis was performed. No intravenous contrast was administered. Examination was ordered  without and with IV contrast. Patient was unable to tolerate the postcontrast portion of the exam. COMPARISON:  CT pelvis 09/24/2020.  MRI lumbar spine 09/24/2020 FINDINGS: Urinary Tract:  No abnormality visualized. Bowel:  Unremarkable visualized pelvic bowel loops. Vascular/Lymphatic: No pelvic or inguinal lymphadenopathy. No evidence of vascular abnormality by MRI. Reproductive:  No mass or other significant abnormality Other:  Trace free fluid within the pelvis is new from prior CT. Musculoskeletal: Susceptibility artifact related to proximal right femoral ORIF degrades evaluation of the adjacent structures with resulting poor fat saturation. No evidence of acute fracture or dislocation. No femoral head avascular necrosis. No bony erosion or  cortical destruction. No bone marrow edema. Physiologic amount of fluid within the bilateral hip joints without significant effusion. No SI joint effusion. Partial-thickness tearing of the left adductor tendon insertions on the pubic tubercle. Mild tendinosis of the bilateral hamstring tendon origins. Right gluteal tendons are poorly evaluated secondary to adjacent hardware. Intramuscular edema and trace perifascial fluid within the anterior compartment of the proximal left thigh (series 17, image 47). No intramuscular fluid collections. Nonspecific subcutaneous edema overlies the bilateral gluteal regions and proximal bilateral thighs, left greater than right. No organized fluid collection within the soft tissues. IMPRESSION: 1. No evidence of acute osteomyelitis or septic arthritis involving the bilateral hips or pelvis. 2. Intramuscular edema and trace perifascial fluid within the anterior compartment of the proximal left thigh. Findings can be seen in the setting of myositis, infection, or trauma. 3. Nonspecific subcutaneous edema overlies the bilateral gluteal regions and proximal bilateral thighs, left greater than right. No organized fluid collection. 4. Partial-thickness tearing of the left adductor tendon insertion on the pubic tubercle. 5. Mild tendinosis of the bilateral hamstring tendon origins. Electronically Signed   By: Davina Poke D.O.   On: 09/26/2020 11:43    Cardiac Studies   Echo 3/10 Hyperdynamic LV function in the setting of sepsis with severely calcified aortic valve and severely reduced aortic valve leaflet excursion. Indeterminate valve morphology due to image quality and calcification, given patient's young age suspect bicuspid aortic valve. Mean gradient through the aortic valve was reported at 42 mmHg, on my independent review these may be slightly over trace and valve gradient may be closer to 35-36 mmHg. This suggest moderate to severe aortic valve stenosis.   Patient  Profile     GABRIEN MENTINK is a 55 y.o. male with a hx of diabetes mellitus with recent hospitalization for DKA, hypertension, hyperlipidemia who is being seen today for the evaluation of aortic valve stenosis at the request of Dr. Nevada Crane.   Assessment & Plan   Principal Problem:   Sepsis ([place]) Active Problems:   Type 2 diabetes mellitus with other specified complication (HCC)   AKI (acute kidney injury) ([place])   Bacterial infection due to E. coli   Bacteremia due to Escherichia coli   Nonrheumatic aortic valve stenosis   Sepsis - unknown source. BCx NGTD from 3/11. Fever overnight. Abx per primary team. Likely reactive thrombocytopenia, plateau today.   Moderate-severe AS - by exam and echo, suspect moderate severe aortic valve stenosis, Stage B/C1 AS. Probable bicuspid aortic valve given age, however cannot tell by TTE. No strong indication for TEE at this time given thrombocytopenia and negative BCx. If TEE is indicated later in hospitalization, would request TEE physician interrogate aortic valve fully if able from transgastric views.   He will need outpatient follow up in my clinic with plans for CT angiography for AV assessment, and  follow up echo in a few months when not septic. I will coordinate this upon follow up.   HLD - recommend statin prior to discharge, consider atorvastatin 40 mg daily.   HTN - currently bacteremic, BP appropriate, no med changes today. When sepsis resolves, if hypertensive consider coreg. Would avoid pure vasodilators given AV stenosis.   [facility] will sign off.   Medication Recommendations:  Continue ASA 81 mg daily. Consider adding statin prior to discharge. Other recommendations (labs, testing, etc):  - Follow up as an outpatient:  Follow up to be arranged in my clinic in 4-6 weeks.   Please contact on call cardiology team if cardiovascular issues arise prior to discharge.  For questions or updates, please contact [facility] Please  consult [email or web address] for contact info under        Signed, [name], MD  09/27/2020, 8:33 AM

## 2020-09-27 NOTE — Progress Notes (Signed)
PROGRESS NOTE  Riley Hood KGY:[id number] DOB: 09-23-1965 DOA: 09/23/2020 PCP: Vevelyn Francois, NP  HPI/Recap of past 24 hours:  HPI: Riley Hood is a 55 y.o. 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 Patient had just been recently hospitalized from 3/1-3/3 with diabetic ketoacidosis which resolved with IV fluids and insulin drip.  He was also noted to have signs of peripheral neuropathy and started on gabapentin.  After getting home patient reports that he was doing okay and has been taking medications as advised.  Symptoms started after he had been outside raking the yard a couple days prior to his presentation.  Initially thought that he may have pulled a muscle.  When he went to stand up had a sharp pain in the lower part of his back that radiated down his left leg.  Noted associated symptoms of some numbness down the left leg as well.    Work-up revealed sepsis secondary to E. coli bacteremia with no clear source of infection.  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  He had a completed 2D echo done on 09/24/2020 which showed LVEF 70 to 75%, moderate concentric left ventricular hypertrophy, moderate calcification of the aortic valve, moderate thickening of the aortic valve, since prior study, the aortic stenosis is now severe.  09/27/20: Seen and examined at bedside this morning.  He is somnolent but easily arousable to verbal stimuli.  Denies any dyspnea or chest pain.  Reports prior history of OSA, did not follow through.   Does not have a CPAP machine at home.  Assessment/Plan: Principal Problem:   Sepsis ([place]) Active Problems:   Type 2 diabetes mellitus with other specified complication (HCC)   AKI (acute kidney injury) ([place])   Bacterial infection due to E. coli   Bacteremia due to Escherichia coli   Nonrheumatic aortic valve stenosis  Sepsis secondary to E. coli bacteremia, unclear source.   Presented with complaints of lower back pain  On presentation febrile with T-max 103.9, tachycardia and tachypnea  Blood cultures positive for E. coli x2/2 bottles.  Resistant to ampicillin and Bactrim.  Intermediate sensitivity for ampicillin/sulbactam. Repeat blood cultures taken on 09/25/2020 - to date. Continue Rocephin 2 g daily. Continue to monitor fever curve and WBC. Procalcitonin 59.9 on 09/26/2020 Procalcitonin downtrending, 37 Afebrile in the last 24 hours. Leukocytosis has resolved.  Severe aortic stenosis seen on 2D echo done on 09/24/2020 2D echo done on 09/24/2020 showed LVEF 70 to 75% with moderate thickening and calcification of the aortic valve.  Now severe aortic stenosis. Cardiology consulted to assist with the management Patient denies history of dizziness or passing out. Appears to be asymptomatic Cardiology planning to follow-up outpatient.  Hyperlipidemia Total cholesterol and LDL undetected Started on Lipitor 40 mg daily as recommended by cardiology.  Untreated OSA, not on CPAP Patient reports prior evaluation for OSA, did not follow through Advised to follow-up with pulmonology posthospitalization Patient is receptive.  Improving AKI Creatinine at baseline 0.9 with GFR greater than 60 Presented with creatinine of 1.4 with GFR 58. Creatinine is downtrending 1.2 from 1.6  from 1.4. Decrease rate of IV fluids, down to 50 cc/h x 1 day. Continue to avoid nephrotoxic agents Continue to monitor urine output Last urine output recorded at 2.1 L in the last 24 hours. Repeat BMP in the  morning.  Improving acute thrombocytopenia, suspect secondary to acute illness Platelet count 95K from 89K from 121K No evidence of bleeding.  Chronic microcytic anemia Appears to be at his baseline hemoglobin 11.4 with MCV 73. Iron studies suggestive of marked iron deficiency. Continue to monitor H&H   Type 2 diabetes with hyperglycemia Hemoglobin A1c 13.1 on 09/15/2020 He is currently on 70/30 insulin 30 units twice daily, insulin sliding scale.   Continue to monitor CBGs  Essential hypertension, BPs are currently soft. Currently on metoprolol tartrate 50 mg twice daily. Continue to monitor vital signs.  Resolved sinus tachycardia TSH 1.289. Continue home metoprolol tartrate 50 mg twice daily. IV Lopressor as needed with parameters.  Resolved hypovolemic hyponatremia  Presented with serum sodium 129 Serum sodium 135. Encourage oral intake.   Improving elevated liver chemistries LFTs are downtrending. Continue to avoid hepatotoxic agents. Continue to monitor. CT abdomen and pelvis done on 09/24/2020 was benign.  Chronic lower back pain No acute bony abnormality on CT abdomen and pelvis with contrast done on 09/24/2020. MRI lumbar spine and pelvic spine completed Continue analgesics.  Robaxin as needed added for muscle spasm.  Physical debility/generalized weakness suspect in the setting of acute illness and sepsis. Non focal Assessed by PT recommendation for home health PT    DVT prophylaxis: Lovenox subcu daily Code Status: Full Family Communication:  Called his sister with his permission [phone], Posey Pronto.  No answer, left a voicemail message. Disposition Plan: Hopefully discharge home once medically stable Consults called:  Cardiology.   Procedures:  2D echo on 09/24/2020.  Antimicrobials:  Rocephin  DVT prophylaxis: Subcu Lovenox daily.  Status is: Inpatient   Dispo: The patient is from: Home.              Anticipated d/c is to: Home.                Patient currently not stable for discharge due to ongoing treatment of sepsis secondary to E. coli bacteremia.   Difficult to place patient: Not applicable.        Objective: Vitals:   09/26/20 2033 09/27/20 0003 09/27/20 0447 09/27/20 0900  BP: 112/82 121/89 122/88 123/78  Pulse: 93 (!) 102 (!) 104 (!) 101  Resp: 18 18 18 20   Temp: 97.9 F (36.6 C) 97.8 F (36.6 C) 98.3 F (36.8 C) 99.2 F (37.3 C)  TempSrc: Oral Oral Oral Oral  SpO2: 97% 98% 97% 95%  Weight:      Height:        Intake/Output Summary (Last 24 hours) at 09/27/2020 1113 Last data filed at 09/27/2020 0450 Gross per 24 hour  Intake 841.09 ml  Output 2175 ml  Net -1333.91 ml   Filed Weights   09/25/20 1400  Weight: 131.5 kg    Exam:  . General: 55 y.o. year-old male obese in no acute stress.  Somnolent but easily arousable to voices.  Oriented x3.   . Cardiovascular: Regular rate and rhythm no rubs or gallops. Marland Kitchen Respiratory: Clear to auscultation no wheezes or rales. . Abdomen: Soft nontender normal bowel sounds present. . Musculoskeletal: Trace upper and lower extremity edema bilaterally. .  Skin: Dry skin throughout. Marland Kitchen Psychiatry: Mood is appropriate for condition and setting.   Data Reviewed: CBC:  Recent Labs  Lab 09/23/20 1940 09/23/20 1956 09/24/20 2059 09/25/20 0053 09/26/20 0200 09/26/20 1646 09/27/20 0121  WBC 9.7  --  8.8 11.0* 10.3 9.7 9.0  NEUTROABS 8.4*  --  7.8*  --   --   --  6.6  HGB 14.5   < > 12.1* 11.7* 11.4* 10.8* 10.9*  HCT 41.4   < > 34.9* 33.6* 32.7* 31.3* 31.3*  MCV 74.9*  --  74.3* 74.3* 73.8* 73.6* 73.1*  PLT 212  --  146* 121* 89* 87* 99*   < > = values in this interval not displayed.   Basic Metabolic Panel: Recent Labs  Lab 09/23/20 1940 09/23/20 1956 09/24/20 2059 09/25/20 0053 09/26/20 0200 09/27/20 0121  NA 129* 135 135 131* 134* 135  K 4.8 4.9 4.2 3.7 4.2 3.8  CL 95* 96* 105 102 105 105  CO2 22  --  23 21* 23 24  GLUCOSE 361* 367* 232*  219* 149* 195*  BUN 13 15 18 17 18 20   CREATININE 1.50* 1.40* 1.41* 1.43* 1.67* 1.28*  CALCIUM 9.1  --  7.9* 7.9* 7.9* 8.2*   GFR: Estimated Creatinine Clearance: 91.5 mL/min (A) (by C-G formula based on SCr of 1.28 mg/dL (H)). Liver Function Tests: Recent Labs  Lab 09/23/20 2319 09/25/20 0053 09/26/20 0200 09/27/20 0121  AST 50* 77* 69* 44*  ALT 36 43 42 39  ALKPHOS 63 75 134* 100  BILITOT 0.7 0.6 0.6 0.4  PROT 6.5 5.8* 6.1* 5.7*  ALBUMIN 2.7* 2.4* 2.1* 1.9*   No results for input(s): LIPASE, AMYLASE in the last 168 hours. No results for input(s): AMMONIA in the last 168 hours. Coagulation Profile: No results for input(s): INR, PROTIME in the last 168 hours. Cardiac Enzymes: No results for input(s): CKTOTAL, CKMB, CKMBINDEX, TROPONINI in the last 168 hours. BNP (last 3 results) No results for input(s): PROBNP in the last 8760 hours. HbA1C: No results for input(s): HGBA1C in the last 72 hours. CBG: Recent Labs  Lab 09/26/20 0605 09/26/20 1112 09/26/20 1603 09/26/20 2148 09/27/20 0609  GLUCAP 171* 171* 233* 198* 162*   Lipid Profile: Recent Labs    09/27/20 0121  CHOL 134  HDL <10*  LDLCALC NOT CALCULATED  TRIG 258*  CHOLHDL NOT CALCULATED   Thyroid Function Tests: Recent Labs    09/25/20 1800  TSH 1.289   Anemia Panel: Recent Labs    09/26/20 1646  VITAMINB12 978*  FOLATE 11.2  FERRITIN 1,466*  TIBC 174*  IRON 12*   Urine analysis:    Component Value Date/Time   COLORURINE AMBER (A) 09/24/2020 0223   APPEARANCEUR HAZY (A) 09/24/2020 0223   LABSPEC 1.021 09/24/2020 0223   PHURINE 5.0 09/24/2020 0223   GLUCOSEU >=500 (A) 09/24/2020 0223   HGBUR MODERATE (A) 09/24/2020 0223   BILIRUBINUR NEGATIVE 09/24/2020 0223   BILIRUBINUR neg 10/07/2019 0951   KETONESUR 20 (A) 09/24/2020 0223   PROTEINUR 30 (A) 09/24/2020 0223   UROBILINOGEN 0.2 09/15/2020 1032   NITRITE NEGATIVE 09/24/2020 0223   LEUKOCYTESUR NEGATIVE 09/24/2020 0223   Sepsis  Labs: @LABRCNTIP (procalcitonin:4,lacticidven:4)  ) Recent Results (from the past 240 hour(s))  Blood culture (routine x 2)     Status: Abnormal (Preliminary result)   Collection Time: 09/23/20  9:23 PM   Specimen: BLOOD RIGHT HAND  Result Value Ref Range Status   Specimen Description BLOOD RIGHT HAND  Final   Special Requests   Final    BOTTLES DRAWN AEROBIC AND ANAEROBIC Blood Culture adequate  volume   Culture  Setup Time   Final    GRAM NEGATIVE RODS IN BOTH AEROBIC AND ANAEROBIC BOTTLES CRITICAL VALUE NOTED.  VALUE IS CONSISTENT WITH PREVIOUSLY REPORTED AND CALLED VALUE.    Culture (A)  Final    ESCHERICHIA COLI SUSCEPTIBILITIES PERFORMED ON PREVIOUS CULTURE WITHIN THE LAST 5 DAYS. Performed at [facility], [place] [address], [place], [place] [address]    Report Status PENDING  Incomplete  Blood culture (routine x 2)     Status: Abnormal (Preliminary result)   Collection Time: 09/23/20  9:23 PM   Specimen: BLOOD  Result Value Ref Range Status   Specimen Description BLOOD RIGHT ANTECUBITAL  Final   Special Requests   Final    BOTTLES DRAWN AEROBIC AND ANAEROBIC Blood Culture results may not be optimal due to an excessive volume of blood received in culture bottles   Culture  Setup Time   Final    GRAM NEGATIVE RODS IN BOTH AEROBIC AND ANAEROBIC BOTTLES CRITICAL RESULT CALLED TO, READ BACK BY AND VERIFIED WITH: Sharen Heck PharmD 11:10 09/24/20 ([email or web address]) Performed at [facility], [place] [address] [place], [place] [address]    Culture ESCHERICHIA COLI (A)  Final   Report Status PENDING  Incomplete   Organism ID, Bacteria ESCHERICHIA COLI  Final      Susceptibility   Escherichia coli - MIC*    AMPICILLIN >=32 RESISTANT Resistant     CEFAZOLIN <=4 SENSITIVE Sensitive     CEFEPIME <=0.12 SENSITIVE Sensitive     CEFTAZIDIME <=1 SENSITIVE Sensitive     CEFTRIAXONE <=0.25 SENSITIVE Sensitive     CIPROFLOXACIN <=0.25 SENSITIVE Sensitive     GENTAMICIN <=1 SENSITIVE  Sensitive     IMIPENEM <=0.25 SENSITIVE Sensitive     TRIMETH/SULFA >=320 RESISTANT Resistant     AMPICILLIN/SULBACTAM 16 INTERMEDIATE Intermediate     PIP/TAZO <=4 SENSITIVE Sensitive     * ESCHERICHIA COLI  Blood Culture ID Panel (Reflexed)     Status: Abnormal   Collection Time: 09/23/20  9:23 PM  Result Value Ref Range Status   Enterococcus faecalis NOT DETECTED NOT DETECTED Final   Enterococcus Faecium NOT DETECTED NOT DETECTED Final   Listeria monocytogenes NOT DETECTED NOT DETECTED Final   Staphylococcus species NOT DETECTED NOT DETECTED Final   Staphylococcus aureus (BCID) NOT DETECTED NOT DETECTED Final   Staphylococcus epidermidis NOT DETECTED NOT DETECTED Final   Staphylococcus lugdunensis NOT DETECTED NOT DETECTED Final   Streptococcus species NOT DETECTED NOT DETECTED Final   Streptococcus agalactiae NOT DETECTED NOT DETECTED Final   Streptococcus pneumoniae NOT DETECTED NOT DETECTED Final   Streptococcus pyogenes NOT DETECTED NOT DETECTED Final   A.calcoaceticus-baumannii NOT DETECTED NOT DETECTED Final   Bacteroides fragilis NOT DETECTED NOT DETECTED Final   Enterobacterales DETECTED (A) NOT DETECTED Final    Comment: Enterobacterales represent a large order of gram negative bacteria, not a single organism. CRITICAL RESULT CALLED TO, READ BACK BY AND VERIFIED WITH: Sharen Heck PharmD 11:10 09/24/20 ([email or web address])    Enterobacter cloacae complex NOT DETECTED NOT DETECTED Final   Escherichia coli DETECTED (A) NOT DETECTED Final   Klebsiella aerogenes NOT DETECTED NOT DETECTED Final   Klebsiella oxytoca NOT DETECTED NOT DETECTED Final    Comment: CRITICAL RESULT CALLED TO, READ BACK BY AND VERIFIED WITH: Sharen Heck PharmD 11:15 09/24/20 ([email or web address])    Klebsiella pneumoniae NOT DETECTED NOT DETECTED Final   Proteus species NOT DETECTED NOT DETECTED Final   Salmonella species NOT  DETECTED NOT DETECTED Final   Serratia marcescens NOT DETECTED NOT DETECTED Final    Haemophilus influenzae NOT DETECTED NOT DETECTED Final   Neisseria meningitidis NOT DETECTED NOT DETECTED Final   Pseudomonas aeruginosa NOT DETECTED NOT DETECTED Final   Stenotrophomonas maltophilia NOT DETECTED NOT DETECTED Final   Candida albicans NOT DETECTED NOT DETECTED Final   Candida auris NOT DETECTED NOT DETECTED Final   Candida glabrata NOT DETECTED NOT DETECTED Final   Candida krusei NOT DETECTED NOT DETECTED Final   Candida parapsilosis NOT DETECTED NOT DETECTED Final   Candida tropicalis NOT DETECTED NOT DETECTED Final   Cryptococcus neoformans/gattii NOT DETECTED NOT DETECTED Final   CTX-M ESBL NOT DETECTED NOT DETECTED Final   Carbapenem resistance IMP NOT DETECTED NOT DETECTED Final   Carbapenem resistance KPC NOT DETECTED NOT DETECTED Final   Carbapenem resistance NDM NOT DETECTED NOT DETECTED Final   Carbapenem resist OXA 48 LIKE NOT DETECTED NOT DETECTED Final   Carbapenem resistance VIM NOT DETECTED NOT DETECTED Final    Comment: Performed at [facility], [address], [place], [place] [address]  Resp Panel by RT-PCR (Flu A&B, Covid) Nasopharyngeal Swab     Status: None   Collection Time: 09/23/20 10:00 PM   Specimen: Nasopharyngeal Swab; Nasopharyngeal(NP) swabs in vial transport medium  Result Value Ref Range Status   SARS Coronavirus 2 by RT PCR NEGATIVE NEGATIVE Final    Comment: (NOTE) SARS-CoV-2 target nucleic acids are NOT DETECTED.  The SARS-CoV-2 RNA is generally detectable in upper respiratory specimens during the acute phase of infection. The lowest concentration of SARS-CoV-2 viral copies this assay can detect is 138 copies/mL. A negative result does not preclude SARS-Cov-2 infection and should not be used as the sole basis for treatment or other patient management decisions. A negative result may occur with  improper specimen collection/handling, submission of specimen other than nasopharyngeal swab, presence of viral mutation(s) within  the areas targeted by this assay, and inadequate number of viral copies(<138 copies/mL). A negative result must be combined with clinical observations, patient history, and epidemiological information. The expected result is Negative.  Fact Sheet for Patients:  EntrepreneurPulse.com.au  Fact Sheet for Healthcare Providers:  IncredibleEmployment.be  This test is no t yet approved or cleared by the [place] FDA and  has been authorized for detection and/or diagnosis of SARS-CoV-2 by FDA under an Emergency Use Authorization (EUA). This EUA will remain  in effect (meaning this test can be used) for the duration of the COVID-19 declaration under Section 564(b)(1) of the Act, 21 U.S.C.section 360bbb-3(b)(1), unless the authorization is terminated  or revoked sooner.       Influenza A by PCR NEGATIVE NEGATIVE Final   Influenza B by PCR NEGATIVE NEGATIVE Final    Comment: (NOTE) The Xpert Xpress SARS-CoV-2/FLU/RSV plus assay is intended as an aid in the diagnosis of influenza from Nasopharyngeal swab specimens and should not be used as a sole basis for treatment. Nasal washings and aspirates are unacceptable for Xpert Xpress SARS-CoV-2/FLU/RSV testing.  Fact Sheet for Patients: EntrepreneurPulse.com.au  Fact Sheet for Healthcare Providers: IncredibleEmployment.be  This test is not yet approved or cleared by the [place] FDA and has been authorized for detection and/or diagnosis of SARS-CoV-2 by FDA under an Emergency Use Authorization (EUA). This EUA will remain in effect (meaning this test can be used) for the duration of the COVID-19 declaration under Section 564(b)(1) of the Act, 21 U.S.C. section 360bbb-3(b)(1), unless the authorization is terminated or  revoked.  Performed at [facility], [place] [address], [place], [place] [address]   Culture, blood (routine x 2)     Status: None  (Preliminary result)   Collection Time: 09/25/20  9:29 AM   Specimen: BLOOD  Result Value Ref Range Status   Specimen Description BLOOD LEFT ANTECUBITAL  Final   Special Requests   Final    BOTTLES DRAWN AEROBIC AND ANAEROBIC Blood Culture results may not be optimal due to an inadequate volume of blood received in culture bottles   Culture   Final    NO GROWTH 1 DAY Performed at [facility], [place] [address] [place], [place] [address]    Report Status PENDING  Incomplete  Culture, blood (routine x 2)     Status: None (Preliminary result)   Collection Time: 09/25/20  9:38 AM   Specimen: BLOOD  Result Value Ref Range Status   Specimen Description BLOOD LEFT ANTECUBITAL  Final   Special Requests   Final    BOTTLES DRAWN AEROBIC ONLY Blood Culture adequate volume   Culture   Final    NO GROWTH 1 DAY Performed at [facility], [place] [address], [place], [place] [address]    Report Status PENDING  Incomplete  MRSA PCR Screening     Status: None   Collection Time: 09/26/20  8:00 AM   Specimen: Nasal Mucosa; Nasopharyngeal  Result Value Ref Range Status   MRSA by PCR NEGATIVE NEGATIVE Final    Comment:        The GeneXpert MRSA Assay (FDA approved for NASAL specimens only), is one component of a comprehensive MRSA colonization surveillance program. It is not intended to diagnose MRSA infection nor to guide or monitor treatment for MRSA infections. Performed at [facility], [place] [address], [place], [place] [address]       Studies: No results found.  Scheduled Meds: . aspirin EC  81 mg Oral Daily  . atorvastatin  40 mg Oral Daily  . enoxaparin (LOVENOX) injection  40 mg Subcutaneous Daily  . insulin aspart  0-20 Units Subcutaneous TID WC  . insulin aspart protamine- aspart  30 Units Subcutaneous BID WC  . lidocaine  1 patch Transdermal Daily  . metoprolol tartrate  50 mg Oral BID  . sodium chloride flush  3 mL Intravenous Q12H    Continuous  Infusions: . sodium chloride 50 mL/hr at 09/26/20 2139  . cefTRIAXone (ROCEPHIN)  IV Stopped (09/26/20 1402)     LOS: 3 days     Kayleen Memos, MD [facility] Pager [phone]  If 7PM-7AM, please contact night-coverage [email or web address] Password [facility] 09/27/2020, 11:13 AM

## 2020-09-28 DIAGNOSIS — E1169 Type 2 diabetes mellitus with other specified complication: Secondary | ICD-10-CM

## 2020-09-28 DIAGNOSIS — A4151 Sepsis due to Escherichia coli [E. coli]: Principal | ICD-10-CM

## 2020-09-28 DIAGNOSIS — B9620 Unspecified Escherichia coli [E. coli] as the cause of diseases classified elsewhere: Secondary | ICD-10-CM

## 2020-09-28 DIAGNOSIS — I350 Nonrheumatic aortic (valve) stenosis: Secondary | ICD-10-CM

## 2020-09-28 DIAGNOSIS — N179 Acute kidney failure, unspecified: Secondary | ICD-10-CM

## 2020-09-28 DIAGNOSIS — M545 Low back pain, unspecified: Secondary | ICD-10-CM

## 2020-09-28 DIAGNOSIS — R6520 Severe sepsis without septic shock: Secondary | ICD-10-CM

## 2020-09-28 DIAGNOSIS — Z794 Long term (current) use of insulin: Secondary | ICD-10-CM

## 2020-09-28 DIAGNOSIS — A498 Other bacterial infections of unspecified site: Secondary | ICD-10-CM

## 2020-09-28 LAB — BASIC METABOLIC PANEL
Anion gap: 6 (ref 5–15)
BUN: 15 mg/dL (ref 6–20)
CO2: 24 mmol/L (ref 22–32)
Calcium: 8.2 mg/dL — ABNORMAL LOW (ref 8.9–10.3)
Chloride: 105 mmol/L (ref 98–111)
Creatinine, Ser: 0.93 mg/dL (ref 0.61–1.24)
GFR, Estimated: >60 mL/min (ref >60)
Glucose, Bld: 162 mg/dL — ABNORMAL HIGH (ref 70–99)
Potassium: 3.6 mmol/L (ref 3.5–5.1)
Sodium: 135 mmol/L (ref 135–145)

## 2020-09-28 LAB — CBC WITH DIFFERENTIAL/PLATELET
Abs Immature Granulocytes: 0.07 10*3/uL (ref 0.00–0.07)
Basophils Absolute: 0 10*3/uL (ref 0.0–0.1)
Basophils Relative: 1 %
Eosinophils Absolute: 0 10*3/uL (ref 0.0–0.5)
Eosinophils Relative: 1 %
HCT: 27.9 % — ABNORMAL LOW (ref 39.0–52.0)
Hemoglobin: 10.1 g/dL — ABNORMAL LOW (ref 13.0–17.0)
Immature Granulocytes: 1 %
Lymphocytes Relative: 21 %
Lymphs Abs: 1.6 10*3/uL (ref 0.7–4.0)
MCH: 26 pg (ref 26.0–34.0)
MCHC: 36.2 g/dL — ABNORMAL HIGH (ref 30.0–36.0)
MCV: 71.7 fL — ABNORMAL LOW (ref 80.0–100.0)
Monocytes Absolute: 1 10*3/uL (ref 0.1–1.0)
Monocytes Relative: 13 %
Neutro Abs: 5.2 10*3/uL (ref 1.7–7.7)
Neutrophils Relative %: 63 %
Platelets: 130 10*3/uL — ABNORMAL LOW (ref 150–400)
RBC: 3.89 MIL/uL — ABNORMAL LOW (ref 4.22–5.81)
RDW: 14.6 % (ref 11.5–15.5)
WBC: 8 10*3/uL (ref 4.0–10.5)
nRBC: 0 % (ref 0.0–0.2)

## 2020-09-28 LAB — BLOOD GAS, VENOUS
Acid-base deficit: 0 mmol/L (ref 0.0–2.0)
Bicarbonate: 25.6 mmol/L (ref 20.0–28.0)
Drawn by: 5896
FIO2: 21
O2 Saturation: 34.1 %
Patient temperature: 37
pCO2, Ven: 54 mmHg (ref 44.0–60.0)
pH, Ven: 7.298 (ref 7.250–7.430)
pO2, Ven: <31 mmHg — CL (ref 32.0–45.0)

## 2020-09-28 LAB — MAGNESIUM: Magnesium: 1.8 mg/dL (ref 1.7–2.4)

## 2020-09-28 LAB — GLUCOSE, CAPILLARY
Glucose-Capillary: 159 mg/dL — ABNORMAL HIGH (ref 70–99)
Glucose-Capillary: 213 mg/dL — ABNORMAL HIGH (ref 70–99)
Glucose-Capillary: 147 mg/dL — ABNORMAL HIGH (ref 70–99)
Glucose-Capillary: 116 mg/dL — ABNORMAL HIGH (ref 70–99)

## 2020-09-28 LAB — PROCALCITONIN: Procalcitonin: 17.63 ng/mL

## 2020-09-28 MED ORDER — ENOXAPARIN SODIUM 80 MG/0.8ML ~~LOC~~ SOLN
65.0000 mg | Freq: Every day | SUBCUTANEOUS | Status: DC
  Administered 2020-09-28 – 2020-10-01 (×4): 65 mg via SUBCUTANEOUS
  Filled 2020-09-28 (×4): qty 0.8

## 2020-09-28 MED ORDER — PHENOL 1.4 % MT LIQD
1.0000 | OROMUCOSAL | Status: DC | PRN
  Administered 2020-09-28: 1 via OROMUCOSAL
  Filled 2020-09-28: qty 177

## 2020-09-28 MED ORDER — CEFAZOLIN SODIUM-DEXTROSE 2-4 GM/100ML-% IV SOLN
2.0000 g | Freq: Three times a day (TID) | INTRAVENOUS | Status: DC
  Administered 2020-09-28 – 2020-10-01 (×10): 2 g via INTRAVENOUS
  Filled 2020-09-28 (×12): qty 100

## 2020-09-28 MED ORDER — SODIUM CHLORIDE 0.9 % IV SOLN: INTRAVENOUS | Status: DC

## 2020-09-28 NOTE — Progress Notes (Signed)
Patient placed on CPAP for tonight.
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
## 2020-09-28 NOTE — Progress Notes (Signed)
    [facility] has been requested to perform a transesophageal echocardiogram on Riley Hood for bacteremia.  After careful review of history and examination, the risks and benefits of transesophageal echocardiogram have been explained including risks of esophageal damage, perforation (1:10,000 risk), bleeding, pharyngeal hematoma as well as other potential complications associated with conscious sedation including aspiration, arrhythmia, respiratory failure and death. Alternatives to treatment were discussed, questions were answered. Patient is willing to proceed.   Kathyrn Drown, NP  09/28/2020 4:41 PM

## 2020-09-28 NOTE — Progress Notes (Signed)
OT Cancellation Note  Patient Details Name: TAGEN MILBY MRN: [id number] DOB: April 25, 1966   Cancelled Treatment:    Reason Eval/Treat Not Completed: Patient declined, no reason specified- reports " Can you come back later, I'm sleepy".  Declined OT this am, will re-attempt as able.   Jolaine Artist, OT Acute Rehabilitation Services Pager [phone] Office [phone]   Delight Stare 09/28/2020, 8:57 AM

## 2020-09-28 NOTE — Consult Note (Signed)
Date of Admission:  09/23/2020          Reason for Consult: E. coli bacteremia without clear source and severe low back pain    Referring Provider: Dr. Nevada Crane   Assessment:  1. E. coli bacteremia of unclear source 2. Severe low back pain with negative imaging including MRI 3. Heart murmur and aortic valve stenosis seen on transthoracic echocardiogram 4. Acute kidney injury 5. Diabetes mellitus with recent hospitalization for diabetic ketoacidosis. 6. Hyperlipidemia  Plan:  1. Narrow to cefazolin 2. Repeat blood cultures 3. Would ask cardiology to perform transesophageal echocardiogram to evaluate his heart valves.  E. coli is certainly not a common cause of endocarditis but his back pain is quite prominent and is a known symptom that endocarditis can present with and he has a murmur that he tells me he has never been told about before 4. Recheck esr, crp  Principal Problem:   Sepsis ([place]) Active Problems:   Type 2 diabetes mellitus with other specified complication (HCC)   AKI (acute kidney injury) ([place])   Bacterial infection due to E. coli   Bacteremia due to Escherichia coli   Nonrheumatic aortic valve stenosis   Scheduled Meds: . aspirin EC  81 mg Oral Daily  . atorvastatin  40 mg Oral Daily  . enoxaparin (LOVENOX) injection  65 mg Subcutaneous Daily  . insulin aspart  0-20 Units Subcutaneous TID WC  . insulin aspart protamine- aspart  30 Units Subcutaneous BID WC  . lidocaine  1 patch Transdermal Daily  . metoprolol tartrate  50 mg Oral BID  . sodium chloride flush  3 mL Intravenous Q12H   Continuous Infusions: .  ceFAZolin (ANCEF) IV    . cefTRIAXone (ROCEPHIN)  IV 2 g (09/27/20 1344)   PRN Meds:.acetaminophen **OR** acetaminophen, albuterol, HYDROcodone-acetaminophen, methocarbamol, metoprolol tartrate, ondansetron **OR** ondansetron (ZOFRAN) IV, phenol  HPI: CHARES Hood is a 55 y.o. male with past medical history significant for hypertension  hyperlipidemia obstructive sleep apnea diabetes type with neuropathy, who was recently hospitalized for diabetic ketoacidosis that improved with fluids and insulin drip in early March.  He then apparently had been outside raking in the yard when he developed some severe low back pain.  He initially thought that this was a due to him stooping over and bending down and getting back up with a rake.  He said he experienced a sharp pain lower part of his back that radiate down his left leg.  He ultimately came to the emergency department on March the 10th 2022.  On admission he was a febrile to 103.8 tachypneic.  He had acute kidney injury and some hyponatremia.  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  In the interim blood cultures have come back positive for E. coli.  Source of his infection remains unclear.  He denies having had antecedent dysuria or suprapubic pain.  When asked him where his back pain is located he gestures to the center of his back.  Back pain also does not seem to have improved very much but he also states that it was not present  prior to this admission.  Given the fact that back pain can be a presenting symptom for endocarditis in particular with negative imaging of the spine and given his murmur and unclear source of his bacteremia I think getting a transesophageal echocardiogram would be wise next step.  I will narrow his antibiotics to cefazolin.  If TEE is negative we will need to contemplate whether to give him a short course of antibiotics versus giving him a course of antibiotics for a discitis that  is not visualized on imaging yet.  I have certainly seen that happen in the context of staph aureus bacteremia but I have not seen it with E. coli and the abruptness with which is present it is fairly a typical.  Review of Systems: Review of Systems  Constitutional: Positive for chills, diaphoresis, fever and malaise/fatigue. Negative for weight loss.  HENT: Negative for congestion, hearing loss, sore throat and tinnitus.   Eyes: Negative for blurred vision and double vision.  Respiratory: Negative for cough, sputum production, shortness of breath and wheezing.   Cardiovascular: Positive for palpitations. Negative for chest pain and leg swelling.  Gastrointestinal: Negative for abdominal pain, blood in stool, constipation, diarrhea, heartburn, melena, nausea and vomiting.  Genitourinary: Negative for dysuria, flank pain and hematuria.  Musculoskeletal: Positive for back pain and myalgias. Negative for falls and joint pain.  Skin: Negative for itching and rash.  Neurological: Negative for dizziness, sensory change, focal weakness, loss of consciousness, weakness and headaches.  Endo/Heme/Allergies: Does not bruise/bleed easily.  Psychiatric/Behavioral: Negative for depression, memory loss and suicidal ideas. The patient is not nervous/anxious.     Past Medical History:  Diagnosis Date  . Diabetes mellitus without complication ([place])   . HLD (hyperlipidemia)   . Hypertension     Social History   Tobacco Use  . Smoking status: Never Smoker  . Smokeless tobacco: Never Used  Vaping Use  . Vaping Use: Never used  Substance Use Topics  . Alcohol use: Yes    Comment: occasional beer  . Drug use: No    Family History  Family history unknown: Yes   No Known Allergies  OBJECTIVE: Blood pressure 136/87, pulse 99, temperature 100 F (37.8 C), temperature source Oral, resp. rate 20, height 6' (1.829 m), weight 131.5 kg, SpO2 99 %.  Physical Exam Constitutional:      General: He is  not in acute distress.    Appearance: Normal appearance. He is well-developed. He is not ill-appearing or diaphoretic.  HENT:     Head: Normocephalic and atraumatic.     Right Ear: Hearing and external ear normal.     Left Ear: Hearing and external ear normal.     Nose: No nasal deformity or rhinorrhea.  Eyes:     General: No scleral icterus.    Conjunctiva/sclera: Conjunctivae normal.     Right eye: Right conjunctiva is not injected.     Left eye: Left conjunctiva is not injected.     Pupils: Pupils are equal, round, and reactive to light.  Neck:     Vascular: No JVD.  Cardiovascular:     Rate and Rhythm: Normal rate and regular rhythm.     Heart sounds: S1 normal and S2 normal. Murmur heard.  No friction rub.  Pulmonary:     Effort: Pulmonary effort is normal. No respiratory distress.     Breath sounds: Normal breath sounds. No wheezing or rhonchi.  Abdominal:     General: Bowel sounds are normal. There is  no distension.     Palpations: Abdomen is soft.     Tenderness: There is no abdominal tenderness. There is no rebound.  Musculoskeletal:        General: Normal range of motion.     Right shoulder: Normal.     Left shoulder: Normal.     Cervical back: Normal range of motion and neck supple.     Right hip: Normal.     Left hip: Normal.     Right knee: Normal.     Left knee: Normal.  Lymphadenopathy:     Head:     Right side of head: No submandibular, preauricular or posterior auricular adenopathy.     Left side of head: No submandibular, preauricular or posterior auricular adenopathy.     Cervical: No cervical adenopathy.     Right cervical: No superficial or deep cervical adenopathy.    Left cervical: No superficial or deep cervical adenopathy.  Skin:    General: Skin is warm and dry.     Coloration: Skin is not pale.     Findings: No abrasion, bruising, ecchymosis, erythema, lesion or rash.     Nails: There is no clubbing.  Neurological:     Mental Status: He is  alert and oriented to person, place, and time.     Sensory: No sensory deficit.     Coordination: Coordination normal.     Gait: Gait normal.  Psychiatric:        Attention and Perception: Attention normal. He is attentive.        Mood and Affect: Mood normal.        Speech: Speech is delayed.        Behavior: Behavior normal. Behavior is cooperative.        Thought Content: Thought content normal.        Cognition and Memory: Cognition normal.        Judgment: Judgment normal.     Comments: Patient was very sleepy this am when I examiend him     Lab Results Lab Results  Component Value Date   WBC 8.0 09/28/2020   HGB 10.1 (L) 09/28/2020   HCT 27.9 (L) 09/28/2020   MCV 71.7 (L) 09/28/2020   PLT 130 (L) 09/28/2020    Lab Results  Component Value Date   CREATININE 0.93 09/28/2020   BUN 15 09/28/2020   NA 135 09/28/2020   K 3.6 09/28/2020   CL 105 09/28/2020   CO2 24 09/28/2020    Lab Results  Component Value Date   ALT 39 09/27/2020   AST 44 (H) 09/27/2020   ALKPHOS 100 09/27/2020   BILITOT 0.4 09/27/2020     Microbiology: Recent Results (from the past 240 hour(s))  Blood culture (routine x 2)     Status: Abnormal   Collection Time: 09/23/20  9:23 PM   Specimen: BLOOD RIGHT HAND  Result Value Ref Range Status   Specimen Description BLOOD RIGHT HAND  Final   Special Requests   Final    BOTTLES DRAWN AEROBIC AND ANAEROBIC Blood Culture adequate volume   Culture  Setup Time   Final    GRAM NEGATIVE RODS IN BOTH AEROBIC AND ANAEROBIC BOTTLES CRITICAL VALUE NOTED.  VALUE IS CONSISTENT WITH PREVIOUSLY REPORTED AND CALLED VALUE.    Culture (A)  Final    ESCHERICHIA COLI SUSCEPTIBILITIES PERFORMED ON PREVIOUS CULTURE WITHIN THE LAST 5 DAYS. Performed at [facility], [place] [address] [place], [place] [address]  Report Status 09/27/2020 FINAL  Final  Blood culture (routine x 2)     Status: Abnormal   Collection Time: 09/23/20  9:23 PM   Specimen: BLOOD   Result Value Ref Range Status   Specimen Description BLOOD RIGHT ANTECUBITAL  Final   Special Requests   Final    BOTTLES DRAWN AEROBIC AND ANAEROBIC Blood Culture results may not be optimal due to an excessive volume of blood received in culture bottles   Culture  Setup Time   Final    GRAM NEGATIVE RODS IN BOTH AEROBIC AND ANAEROBIC BOTTLES CRITICAL RESULT CALLED TO, READ BACK BY AND VERIFIED WITH: Sharen Heck PharmD 11:10 09/24/20 ([email or web address]) Performed at [facility], [place] [address] [place], [place] [address]    Culture ESCHERICHIA COLI (A)  Final   Report Status 09/27/2020 FINAL  Final   Organism ID, Bacteria ESCHERICHIA COLI  Final      Susceptibility   Escherichia coli - MIC*    AMPICILLIN >=32 RESISTANT Resistant     CEFAZOLIN <=4 SENSITIVE Sensitive     CEFEPIME <=0.12 SENSITIVE Sensitive     CEFTAZIDIME <=1 SENSITIVE Sensitive     CEFTRIAXONE <=0.25 SENSITIVE Sensitive     CIPROFLOXACIN <=0.25 SENSITIVE Sensitive     GENTAMICIN <=1 SENSITIVE Sensitive     IMIPENEM <=0.25 SENSITIVE Sensitive     TRIMETH/SULFA >=320 RESISTANT Resistant     AMPICILLIN/SULBACTAM 16 INTERMEDIATE Intermediate     PIP/TAZO <=4 SENSITIVE Sensitive     * ESCHERICHIA COLI  Blood Culture ID Panel (Reflexed)     Status: Abnormal   Collection Time: 09/23/20  9:23 PM  Result Value Ref Range Status   Enterococcus faecalis NOT DETECTED NOT DETECTED Final   Enterococcus Faecium NOT DETECTED NOT DETECTED Final   Listeria monocytogenes NOT DETECTED NOT DETECTED Final   Staphylococcus species NOT DETECTED NOT DETECTED Final   Staphylococcus aureus (BCID) NOT DETECTED NOT DETECTED Final   Staphylococcus epidermidis NOT DETECTED NOT DETECTED Final   Staphylococcus lugdunensis NOT DETECTED NOT DETECTED Final   Streptococcus species NOT DETECTED NOT DETECTED Final   Streptococcus agalactiae NOT DETECTED NOT DETECTED Final   Streptococcus pneumoniae NOT DETECTED NOT DETECTED Final   Streptococcus  pyogenes NOT DETECTED NOT DETECTED Final   A.calcoaceticus-baumannii NOT DETECTED NOT DETECTED Final   Bacteroides fragilis NOT DETECTED NOT DETECTED Final   Enterobacterales DETECTED (A) NOT DETECTED Final    Comment: Enterobacterales represent a large order of gram negative bacteria, not a single organism. CRITICAL RESULT CALLED TO, READ BACK BY AND VERIFIED WITH: Sharen Heck PharmD 11:10 09/24/20 ([email or web address])    Enterobacter cloacae complex NOT DETECTED NOT DETECTED Final   Escherichia coli DETECTED (A) NOT DETECTED Final   Klebsiella aerogenes NOT DETECTED NOT DETECTED Final   Klebsiella oxytoca NOT DETECTED NOT DETECTED Final    Comment: CRITICAL RESULT CALLED TO, READ BACK BY AND VERIFIED WITH: Sharen Heck PharmD 11:15 09/24/20 ([email or web address])    Klebsiella pneumoniae NOT DETECTED NOT DETECTED Final   Proteus species NOT DETECTED NOT DETECTED Final   Salmonella species NOT DETECTED NOT DETECTED Final   Serratia marcescens NOT DETECTED NOT DETECTED Final   Haemophilus influenzae NOT DETECTED NOT DETECTED Final   Neisseria meningitidis NOT DETECTED NOT DETECTED Final   Pseudomonas aeruginosa NOT DETECTED NOT DETECTED Final   Stenotrophomonas maltophilia NOT DETECTED NOT DETECTED Final   Candida albicans NOT DETECTED NOT DETECTED Final   Candida auris NOT DETECTED NOT DETECTED Final   Candida glabrata NOT  DETECTED NOT DETECTED Final   Candida krusei NOT DETECTED NOT DETECTED Final   Candida parapsilosis NOT DETECTED NOT DETECTED Final   Candida tropicalis NOT DETECTED NOT DETECTED Final   Cryptococcus neoformans/gattii NOT DETECTED NOT DETECTED Final   CTX-M ESBL NOT DETECTED NOT DETECTED Final   Carbapenem resistance IMP NOT DETECTED NOT DETECTED Final   Carbapenem resistance KPC NOT DETECTED NOT DETECTED Final   Carbapenem resistance NDM NOT DETECTED NOT DETECTED Final   Carbapenem resist OXA 48 LIKE NOT DETECTED NOT DETECTED Final   Carbapenem resistance VIM NOT DETECTED NOT  DETECTED Final    Comment: Performed at [facility], [address] [place], [place] [address]  Resp Panel by RT-PCR (Flu A&B, Covid) Nasopharyngeal Swab     Status: None   Collection Time: 09/23/20 10:00 PM   Specimen: Nasopharyngeal Swab; Nasopharyngeal(NP) swabs in vial transport medium  Result Value Ref Range Status   SARS Coronavirus 2 by RT PCR NEGATIVE NEGATIVE Final    Comment: (NOTE) SARS-CoV-2 target nucleic acids are NOT DETECTED.  The SARS-CoV-2 RNA is generally detectable in upper respiratory specimens during the acute phase of infection. The lowest concentration of SARS-CoV-2 viral copies this assay can detect is 138 copies/mL. A negative result does not preclude SARS-Cov-2 infection and should not be used as the sole basis for treatment or other patient management decisions. A negative result may occur with  improper specimen collection/handling, submission of specimen other than nasopharyngeal swab, presence of viral mutation(s) within the areas targeted by this assay, and inadequate number of viral copies(<138 copies/mL). A negative result must be combined with clinical observations, patient history, and epidemiological information. The expected result is Negative.  Fact Sheet for Patients:  BloggerCourse.com  Fact Sheet for Healthcare Providers:  SeriousBroker.it  This test is no t yet approved or cleared by the [place] FDA and  has been authorized for detection and/or diagnosis of SARS-CoV-2 by FDA under an Emergency Use Authorization (EUA). This EUA will remain  in effect (meaning this test can be used) for the duration of the COVID-19 declaration under Section 564(b)(1) of the Act, 21 U.S.C.section 360bbb-3(b)(1), unless the authorization is terminated  or revoked sooner.       Influenza A by PCR NEGATIVE NEGATIVE Final   Influenza B by PCR NEGATIVE NEGATIVE Final    Comment: (NOTE) The Xpert  Xpress SARS-CoV-2/FLU/RSV plus assay is intended as an aid in the diagnosis of influenza from Nasopharyngeal swab specimens and should not be used as a sole basis for treatment. Nasal washings and aspirates are unacceptable for Xpert Xpress SARS-CoV-2/FLU/RSV testing.  Fact Sheet for Patients: BloggerCourse.com  Fact Sheet for Healthcare Providers: SeriousBroker.it  This test is not yet approved or cleared by the [place] FDA and has been authorized for detection and/or diagnosis of SARS-CoV-2 by FDA under an Emergency Use Authorization (EUA). This EUA will remain in effect (meaning this test can be used) for the duration of the COVID-19 declaration under Section 564(b)(1) of the Act, 21 U.S.C. section 360bbb-3(b)(1), unless the authorization is terminated or revoked.  Performed at [facility], [address] [place], [place] [address]   Culture, blood (routine x 2)     Status: None (Preliminary result)   Collection Time: 09/25/20  9:29 AM   Specimen: BLOOD  Result Value Ref Range Status   Specimen Description BLOOD LEFT ANTECUBITAL  Final   Special Requests   Final    BOTTLES DRAWN AEROBIC AND ANAEROBIC  Blood Culture results may not be optimal due to an inadequate volume of blood received in culture bottles   Culture   Final    NO GROWTH 3 DAYS Performed at [facility], [place] [address] [place], [place] [address]    Report Status PENDING  Incomplete  Culture, blood (routine x 2)     Status: None (Preliminary result)   Collection Time: 09/25/20  9:38 AM   Specimen: BLOOD  Result Value Ref Range Status   Specimen Description BLOOD LEFT ANTECUBITAL  Final   Special Requests   Final    BOTTLES DRAWN AEROBIC ONLY Blood Culture adequate volume   Culture   Final    NO GROWTH 3 DAYS Performed at [facility], [place] [address], [place], [place] [address]    Report Status PENDING  Incomplete  MRSA PCR  Screening     Status: None   Collection Time: 09/26/20  8:00 AM   Specimen: Nasal Mucosa; Nasopharyngeal  Result Value Ref Range Status   MRSA by PCR NEGATIVE NEGATIVE Final    Comment:        The GeneXpert MRSA Assay (FDA approved for NASAL specimens only), is one component of a comprehensive MRSA colonization surveillance program. It is not intended to diagnose MRSA infection nor to guide or monitor treatment for MRSA infections. Performed at [facility], [place] [address] [place], [place] [address]     Alcide Evener, [facility] [phone] pager  09/28/2020, 11:13 AM

## 2020-09-28 NOTE — Anesthesia Preprocedure Evaluation (Addendum)
Anesthesia Evaluation  Patient identified by MRN, date of birth, ID band Patient awake    Reviewed: Allergy & Precautions, NPO status , Patient's Chart, lab work & pertinent test results  Airway Mallampati: II  TM Distance: >3 FB Neck ROM: Full    Dental  (+) Missing, Dental Advisory Given, Poor Dentition, Chipped   Pulmonary neg pulmonary ROS,    Pulmonary exam normal breath sounds clear to auscultation       Cardiovascular hypertension, Pt. on medications Normal cardiovascular exam+ Valvular Problems/Murmurs  Rhythm:Regular Rate:Normal  Echo 09/2020 1. Left ventricular ejection fraction, by estimation, is 70 to 75%. The left ventricle has hyperdynamic function. The left ventricle has no regional wall motion abnormalities. There is moderate concentric left ventricular hypertrophy. Left ventricular diastolic parameters were normal.  2. Right ventricular systolic function is normal. The right ventricular size is normal. Tricuspid regurgitation signal is inadequate for assessing PA pressure.  3. The mitral valve is grossly normal. No evidence of mitral valve regurgitation. No evidence of mitral stenosis.  4. The aortic valve has an indeterminant number of cusps. There is moderate calcification of the aortic valve. There is moderate thickening of the aortic valve. Aortic valve regurgitation is not visualized.  5. The inferior vena cava is normal in size with greater than 50% respiratory variability, suggesting right atrial pressure of 3 mmHg.   Neuro/Psych  Neuromuscular disease    GI/Hepatic negative GI ROS, Neg liver ROS,   Endo/Other  diabetesMorbid obesity  Renal/GU Renal disease     Musculoskeletal negative musculoskeletal ROS (+)   Abdominal   Peds  Hematology negative hematology ROS (+)   Anesthesia Other Findings   Reproductive/Obstetrics                            Anesthesia  Physical Anesthesia Plan  ASA: III  Anesthesia Plan: MAC   Post-op Pain Management:    Induction: Intravenous  PONV Risk Score and Plan: 1 and Propofol infusion, Treatment may vary due to age or medical condition and TIVA  Airway Management Planned: Natural Airway  Additional Equipment:   Intra-op Plan:   Post-operative Plan:   Informed Consent: I have reviewed the patients History and Physical, chart, labs and discussed the procedure including the risks, benefits and alternatives for the proposed anesthesia with the patient or authorized representative who has indicated his/her understanding and acceptance.     Dental advisory given  Plan Discussed with: CRNA  Anesthesia Plan Comments:        Anesthesia Quick Evaluation

## 2020-09-28 NOTE — TOC Initial Note (Addendum)
Transition of Care [facility]) - Initial/Assessment Note    Patient Details  Name: Riley Hood MRN: [id number] Date of Birth: [birth year]  Transition of Care [place]) CM/SW Contact:    Marilu Favre, RN Phone Number: 09/28/2020, 1:08 PM  Clinical Narrative:                 Spoke with patient at bedside.  Confirmed face sheet information . Patient lives with son who can assist him at home. He has no DME at home.   Patient's PCP Dr Dionisio David, prior admission appointment was made at [facility] October 19, 2020 on AVS. Patient does get medications filled at [facility].   Patient uninsured. Orders for HHPT/OT. Also TEE. Will need to know for sure if IV ABX needed before arranging [place].   Today [facility] is for charity assistance tomorrow changes to [facility]. Explained to patient he voiced understanding.   If PT/OT needed and cannot find accepting agency he is agreeable to OP PT on [place]   Patient Goals and CMS Choice Patient states their goals for this hospitalization and ongoing recovery are:: to go home CMS Medicare.gov Compare Post Acute Care list provided to:: Patient Choice offered to / list presented to : Patient  Expected Discharge Plan and Services   In-house Referral: Financial Counselor Discharge Planning Services: CM Consult Post Acute Care Choice: [place] arrangements for the past 2 months: Single Family Home                                      Prior Living Arrangements/Services Living arrangements for the past 2 months: Single Family Home Lives with:: Adult Children Patient language and need for interpreter reviewed:: Yes Do you feel safe going back to the place where you live?: Yes      Need for Family Participation in Patient Care: Yes (Comment) Care giver support system in place?: Yes (comment)   Criminal Activity/Legal Involvement Pertinent to Current Situation/Hospitalization: No - Comment as  needed  Activities of Daily Living Home Assistive Devices/Equipment: CBG Meter ADL Screening (condition at time of admission) Patient's cognitive ability adequate to safely complete daily activities?: Yes Is the patient deaf or have difficulty hearing?: No Does the patient have difficulty seeing, even when wearing glasses/contacts?: No Does the patient have difficulty concentrating, remembering, or making decisions?: No Patient able to express need for assistance with ADLs?: Yes Does the patient have difficulty dressing or bathing?: No Independently performs ADLs?: Yes (appropriate for developmental age) Does the patient have difficulty walking or climbing stairs?: Yes Weakness of Legs: Both Weakness of Arms/Hands: None  Permission Sought/Granted   Permission granted to share information with : No              Emotional Assessment Appearance:: Appears stated age   Affect (typically observed): Accepting Orientation: : Oriented to Self,Oriented to Place,Oriented to  Time,Oriented to Situation Alcohol / Substance Use: Not Applicable Psych Involvement: No (comment)  Admission diagnosis:  Fever, unknown origin [R50.9] Fever in adult [R50.9] Acute left-sided low back pain with left-sided sciatica [M54.42] Patient Active Problem List   Diagnosis Date Noted  . Nonrheumatic aortic valve stenosis   . Sepsis ([place]) 09/24/2020  . Bacterial infection due to E. coli 09/24/2020  . Bacteremia due to Escherichia coli 09/24/2020  . Hyperlipidemia 09/16/2020  . Peripheral neuropathy 09/16/2020  . AKI (acute kidney  injury) ([place])   . Type 2 diabetes mellitus with other specified complication ([place]) 82/70/7867  . Hyperosmolar hyperglycemic state (HHS) ([place]) 08/17/2019  . Obesity 06/05/2015  . Murmur, cardiac 06/05/2015  . Chest pain with moderate risk for cardiac etiology 05/26/2015  . Hypertension 05/25/2015  . Hypertensive urgency 05/25/2015   PCP:  Vevelyn Francois, NP Pharmacy:    [facility] [place] [address] [place] [address] Phone: [phone] Fax: [phone]     Social Determinants of Health (SDOH) Interventions    Readmission Risk Interventions No flowsheet data found.

## 2020-09-28 NOTE — Progress Notes (Signed)
PROGRESS NOTE  Riley Hood GYF:[id number] DOB: May 20, 1966 DOA: 09/23/2020 PCP: Riley Francois, NP  HPI/Recap of past 24 hours:  HPI: Riley Hood is a 55 y.o. 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 Patient had just been recently hospitalized from 3/1-3/3 with diabetic ketoacidosis which resolved with IV fluids and insulin drip.  He was also noted to have signs of peripheral neuropathy and started on gabapentin.  After getting home patient reports that he was doing okay and has been taking medications as advised.  Symptoms started after he had been outside raking the yard a couple days prior to his presentation.  Initially thought that he may have pulled a muscle.  When he went to stand up had a sharp pain in the lower part of his back that radiated down his left leg.  Noted associated symptoms of some numbness down the left leg as well.    Work-up revealed sepsis secondary to E. coli bacteremia with no clear source of infection.  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  He had a completed 2D echo done on 09/24/2020 which showed LVEF 70 to 75%, moderate concentric left ventricular hypertrophy, moderate calcification of the aortic valve, moderate thickening of the aortic valve, since prior study, the aortic stenosis is now severe.  Reports prior history of OSA, did not follow through.  Does not have a CPAP machine at home.  Will need to follow up with pulmonary.  Last pco2 was 46 on 3/1, will repeat.   09/27/20: Seen and examined  at bedside.  Main complaint is of lower back pain.  Febrile overnight with T-max of 101.  Infectious disease consulted.  Appreciate recommendations.  Assessment/Plan: Principal Problem:   Sepsis ([place]) Active Problems:   Type 2 diabetes mellitus with other specified complication (HCC)   AKI (acute kidney injury) ([place])   Bacterial infection due to E. coli   Bacteremia due to Escherichia coli   Nonrheumatic aortic valve stenosis  Sepsis secondary to E. coli bacteremia, unclear source.   Presented with complaints of lower back pain  On presentation febrile with T-max 103.9, tachycardia and tachypnea  Blood cultures positive for E. coli x2/2 bottles.  Resistant to ampicillin and Bactrim.  Intermediate sensitivity for ampicillin/sulbactam. Repeat blood cultures taken on 09/25/2020 - to date. Febrile overnight with T-max of 101.  Infectious disease consulted. Received Rocephin 2 g daily from 09/23/20, stopped on 09/28/2020 and switched to Ancef 2 g IV every 8h as recommended by infectious disease. Continue to monitor fever curve and WBC. Procalcitonin continues to downtrend 17 from 59  Leukocytosis has resolved. Due to concern for endocarditis ID recommended TEE Cardiology has been contacted for TEE planning.  Severe aortic stenosis seen on 2D echo done on 09/24/2020 2D echo done on 09/24/2020 showed LVEF 70 to 75% with moderate thickening and calcification of the aortic valve.  Now severe aortic stenosis. Cardiology consulted to assist with the management Patient denies history of dizziness or passing out. Appears to be asymptomatic Cardiology contacted for TEE planning to rule out endocarditis however could potentially further  assess his aortic stenosis.  Hyperlipidemia Total cholesterol and LDL undetected Continue Lipitor 40 mg daily as recommended by cardiology.  Untreated OSA, not on CPAP Patient reports prior evaluation for OSA, did not follow through Advised to follow-up with  pulmonology posthospitalization Patient is receptive. Obtain VBG to assess pH and PCO2.  Resolved AKI Creatinine at baseline 0.9 with GFR greater than 60 Presented with creatinine of 1.4 with GFR 58. He is currently back to his baseline creatinine 0.9 with GFR greater than 60. He has received IV fluid, stopped on 09/28/2020. Encourage oral intake, avoid dehydration. Continue to avoid nephrotoxic agents. Monitor urine output.  Improving acute thrombocytopenia, suspect secondary to acute illness Platelet count uptrending 130k from 95K from 89K from 121K No evidence of bleeding.  Chronic microcytic anemia Appears to be at his baseline hemoglobin 11.4 with MCV 73. Iron studies suggestive of marked iron deficiency. Hold off on iron supplement due to bacteremia Will discharge on iron supplement, once no longer febrile for at least 48 hours. Continue to monitor H&H   Type 2 diabetes with hyperglycemia Hemoglobin A1c 13.1 on 09/15/2020 He is currently on 70/30 insulin 30 units twice daily, insulin sliding scale.   Continue to monitor CBGs  Essential hypertension His BPs had been soft now hypertensive suspect exacerbated by pain Continue metoprolol tartrate 50 mg twice daily. Control pain Continue to monitor vital signs.  Resolved sinus tachycardia TSH 1.289. Continue home metoprolol tartrate 50 mg twice daily. IV Lopressor as needed with parameters.  Resolved hypovolemic hyponatremia  Presented with serum sodium 129 Serum sodium 135. Encourage oral intake.   Improving elevated liver chemistries LFTs are downtrending. Continue to avoid hepatotoxic agents. Continue to monitor. CT abdomen and pelvis done on 09/24/2020 was benign.  Chronic lower back pain No acute bony abnormality on CT abdomen and pelvis with contrast done on 09/24/2020. MRI lumbar spine and pelvic spine completed, findings listed above. Continue analgesics.  Robaxin as needed added for muscle  spasm.  Physical debility/generalized weakness suspect in the setting of acute illness and sepsis. Non focal Assessed by PT recommendation for home health PT    DVT prophylaxis: Lovenox subcu daily Code Status: Full Family Communication:  Called his sister with his permission [phone], Posey Pronto.  No answer, left a voicemail message. Disposition Plan: Hopefully discharge home once medically stable  Consults called:   Cardiology. Infectious disease.   Procedures:  2D echo on 09/24/2020.  Antimicrobials:  Rocephin, stopped on 09/28/2020.  Ancef started on 09/28/2020.  DVT prophylaxis: Subcu Lovenox daily.  Status is: Inpatient   Dispo: The patient is from: Home.              Anticipated d/c is to: Home.               Patient currently not stable for discharge due to ongoing treatment of sepsis secondary to E. coli bacteremia.   Difficult to place patient: Not applicable.        Objective: Vitals:   09/28/20 0013 09/28/20 0442 09/28/20 0900 09/28/20 1227  BP: 121/79 (!) 131/96 136/87 (!) 152/93  Pulse: 91 93 99 93  Resp: 20 18 20 16   Temp: 97.7 F (36.5 C) 98.2 F (36.8 C) 100 F (37.8 C) 98.2 F (36.8 C)  TempSrc: Oral Oral Oral Oral  SpO2: 97% 98% 99% 95%  Weight:      Height:        Intake/Output Summary (Last 24 hours) at 09/28/2020 1241 Last data filed at 09/28/2020  6761 Gross per 24 hour  Intake 480 ml  Output 2850 ml  Net -2370 ml   Filed Weights   09/25/20 1400  Weight: 131.5 kg    Exam:  . General: 55 y.o. year-old male obese in no acute distress.  He is alert and oriented x3.   . Cardiovascular: Regular rate and rhythm no rubs or gallops.   Marland Kitchen Respiratory: Clear to auscultation no wheezes or rales.   . Abdomen: Obese nontender bowel sounds present.   . Musculoskeletal: Trace upper and lower extremity edema bilaterally. .  Skin: Dry skin throughout. Marland Kitchen Psychiatry: Mood is appropriate for condition setting.  Data  Reviewed: CBC: Recent Labs  Lab 09/23/20 1940 09/23/20 1956 09/24/20 2059 09/25/20 0053 09/26/20 0200 09/26/20 1646 09/27/20 0121 09/28/20 0654  WBC 9.7  --  8.8 11.0* 10.3 9.7 9.0 8.0  NEUTROABS 8.4*  --  7.8*  --   --   --  6.6 5.2  HGB 14.5   < > 12.1* 11.7* 11.4* 10.8* 10.9* 10.1*  HCT 41.4   < > 34.9* 33.6* 32.7* 31.3* 31.3* 27.9*  MCV 74.9*  --  74.3* 74.3* 73.8* 73.6* 73.1* 71.7*  PLT 212  --  146* 121* 89* 87* 99* 130*   < > = values in this interval not displayed.   Basic Metabolic Panel: Recent Labs  Lab 09/24/20 2059 09/25/20 0053 09/26/20 0200 09/27/20 0121 09/28/20 0654  NA 135 131* 134* 135 135  K 4.2 3.7 4.2 3.8 3.6  CL 105 102 105 105 105  CO2 23 21* 23 24 24   GLUCOSE 232* 219* 149* 195* 162*  BUN 18 17 18 20 15   CREATININE 1.41* 1.43* 1.67* 1.28* 0.93  CALCIUM 7.9* 7.9* 7.9* 8.2* 8.2*  MG  --   --   --   --  1.8   GFR: Estimated Creatinine Clearance: 125.9 mL/min (by C-G formula based on SCr of 0.93 mg/dL). Liver Function Tests: Recent Labs  Lab 09/23/20 2319 09/25/20 0053 09/26/20 0200 09/27/20 0121  AST 50* 77* 69* 44*  ALT 36 43 42 39  ALKPHOS 63 75 134* 100  BILITOT 0.7 0.6 0.6 0.4  PROT 6.5 5.8* 6.1* 5.7*  ALBUMIN 2.7* 2.4* 2.1* 1.9*   No results for input(s): LIPASE, AMYLASE in the last 168 hours. No results for input(s): AMMONIA in the last 168 hours. Coagulation Profile: No results for input(s): INR, PROTIME in the last 168 hours. Cardiac Enzymes: No results for input(s): CKTOTAL, CKMB, CKMBINDEX, TROPONINI in the last 168 hours. BNP (last 3 results) No results for input(s): PROBNP in the last 8760 hours. HbA1C: No results for input(s): HGBA1C in the last 72 hours. CBG: Recent Labs  Lab 09/27/20 1137 09/27/20 1617 09/27/20 2145 09/28/20 0625 09/28/20 1120  GLUCAP 162* 217* 190* 159* 213*   Lipid Profile: Recent Labs    09/27/20 0121  CHOL 134  HDL <10*  LDLCALC NOT CALCULATED  TRIG 258*  CHOLHDL NOT CALCULATED    Thyroid Function Tests: Recent Labs    09/25/20 1800  TSH 1.289   Anemia Panel: Recent Labs    09/26/20 1646  VITAMINB12 978*  FOLATE 11.2  FERRITIN 1,466*  TIBC 174*  IRON 12*   Urine analysis:    Component Value Date/Time   COLORURINE AMBER (A) 09/24/2020 0223   APPEARANCEUR HAZY (A) 09/24/2020 0223   LABSPEC 1.021 09/24/2020 0223   PHURINE 5.0 09/24/2020 0223   GLUCOSEU >=500 (A) 09/24/2020 0223   HGBUR MODERATE (A) 09/24/2020  [place] 09/24/2020 0223   BILIRUBINUR neg 10/07/2019 0951   KETONESUR 20 (A) 09/24/2020 0223   PROTEINUR 30 (A) 09/24/2020 0223   UROBILINOGEN 0.2 09/15/2020 1032   NITRITE NEGATIVE 09/24/2020 0223   LEUKOCYTESUR NEGATIVE 09/24/2020 0223   Sepsis Labs: @LABRCNTIP (procalcitonin:4,lacticidven:4)  ) Recent Results (from the past 240 hour(s))  Blood culture (routine x 2)     Status: Abnormal   Collection Time: 09/23/20  9:23 PM   Specimen: BLOOD RIGHT HAND  Result Value Ref Range Status   Specimen Description BLOOD RIGHT HAND  Final   Special Requests   Final    BOTTLES DRAWN AEROBIC AND ANAEROBIC Blood Culture adequate volume   Culture  Setup Time   Final    GRAM NEGATIVE RODS IN BOTH AEROBIC AND ANAEROBIC BOTTLES CRITICAL VALUE NOTED.  VALUE IS CONSISTENT WITH PREVIOUSLY REPORTED AND CALLED VALUE.    Culture (A)  Final    ESCHERICHIA COLI SUSCEPTIBILITIES PERFORMED ON PREVIOUS CULTURE WITHIN THE LAST 5 DAYS. Performed at [facility], [place] [address] [place], [place] [address]    Report Status 09/27/2020 FINAL  Final  Blood culture (routine x 2)     Status: Abnormal   Collection Time: 09/23/20  9:23 PM   Specimen: BLOOD  Result Value Ref Range Status   Specimen Description BLOOD RIGHT ANTECUBITAL  Final   Special Requests   Final    BOTTLES DRAWN AEROBIC AND ANAEROBIC Blood Culture results may not be optimal due to an excessive volume of blood received in culture bottles   Culture  Setup Time   Final     GRAM NEGATIVE RODS IN BOTH AEROBIC AND ANAEROBIC BOTTLES CRITICAL RESULT CALLED TO, READ BACK BY AND VERIFIED WITH: Sharen Heck PharmD 11:10 09/24/20 ([email or web address]) Performed at [facility], [place] [address] [place], [place] [address]    Culture ESCHERICHIA COLI (A)  Final   Report Status 09/27/2020 FINAL  Final   Organism ID, Bacteria ESCHERICHIA COLI  Final      Susceptibility   Escherichia coli - MIC*    AMPICILLIN >=32 RESISTANT Resistant     CEFAZOLIN <=4 SENSITIVE Sensitive     CEFEPIME <=0.12 SENSITIVE Sensitive     CEFTAZIDIME <=1 SENSITIVE Sensitive     CEFTRIAXONE <=0.25 SENSITIVE Sensitive     CIPROFLOXACIN <=0.25 SENSITIVE Sensitive     GENTAMICIN <=1 SENSITIVE Sensitive     IMIPENEM <=0.25 SENSITIVE Sensitive     TRIMETH/SULFA >=320 RESISTANT Resistant     AMPICILLIN/SULBACTAM 16 INTERMEDIATE Intermediate     PIP/TAZO <=4 SENSITIVE Sensitive     * ESCHERICHIA COLI  Blood Culture ID Panel (Reflexed)     Status: Abnormal   Collection Time: 09/23/20  9:23 PM  Result Value Ref Range Status   Enterococcus faecalis NOT DETECTED NOT DETECTED Final   Enterococcus Faecium NOT DETECTED NOT DETECTED Final   Listeria monocytogenes NOT DETECTED NOT DETECTED Final   Staphylococcus species NOT DETECTED NOT DETECTED Final   Staphylococcus aureus (BCID) NOT DETECTED NOT DETECTED Final   Staphylococcus epidermidis NOT DETECTED NOT DETECTED Final   Staphylococcus lugdunensis NOT DETECTED NOT DETECTED Final   Streptococcus species NOT DETECTED NOT DETECTED Final   Streptococcus agalactiae NOT DETECTED NOT DETECTED Final   Streptococcus pneumoniae NOT DETECTED NOT DETECTED Final   Streptococcus pyogenes NOT DETECTED NOT DETECTED Final   A.calcoaceticus-baumannii NOT DETECTED NOT DETECTED Final   Bacteroides fragilis NOT DETECTED NOT DETECTED Final   Enterobacterales DETECTED (A)  NOT DETECTED Final    Comment: Enterobacterales represent a large order of gram negative bacteria, not  a single organism. CRITICAL RESULT CALLED TO, READ BACK BY AND VERIFIED WITH: Sharen Heck PharmD 11:10 09/24/20 ([email or web address])    Enterobacter cloacae complex NOT DETECTED NOT DETECTED Final   Escherichia coli DETECTED (A) NOT DETECTED Final   Klebsiella aerogenes NOT DETECTED NOT DETECTED Final   Klebsiella oxytoca NOT DETECTED NOT DETECTED Final    Comment: CRITICAL RESULT CALLED TO, READ BACK BY AND VERIFIED WITH: Sharen Heck PharmD 11:15 09/24/20 ([email or web address])    Klebsiella pneumoniae NOT DETECTED NOT DETECTED Final   Proteus species NOT DETECTED NOT DETECTED Final   Salmonella species NOT DETECTED NOT DETECTED Final   Serratia marcescens NOT DETECTED NOT DETECTED Final   Haemophilus influenzae NOT DETECTED NOT DETECTED Final   Neisseria meningitidis NOT DETECTED NOT DETECTED Final   Pseudomonas aeruginosa NOT DETECTED NOT DETECTED Final   Stenotrophomonas maltophilia NOT DETECTED NOT DETECTED Final   Candida albicans NOT DETECTED NOT DETECTED Final   Candida auris NOT DETECTED NOT DETECTED Final   Candida glabrata NOT DETECTED NOT DETECTED Final   Candida krusei NOT DETECTED NOT DETECTED Final   Candida parapsilosis NOT DETECTED NOT DETECTED Final   Candida tropicalis NOT DETECTED NOT DETECTED Final   Cryptococcus neoformans/gattii NOT DETECTED NOT DETECTED Final   CTX-M ESBL NOT DETECTED NOT DETECTED Final   Carbapenem resistance IMP NOT DETECTED NOT DETECTED Final   Carbapenem resistance KPC NOT DETECTED NOT DETECTED Final   Carbapenem resistance NDM NOT DETECTED NOT DETECTED Final   Carbapenem resist OXA 48 LIKE NOT DETECTED NOT DETECTED Final   Carbapenem resistance VIM NOT DETECTED NOT DETECTED Final    Comment: Performed at [facility], [address] [place], [place] [address]  Resp Panel by RT-PCR (Flu A&B, Covid) Nasopharyngeal Swab     Status: None   Collection Time: 09/23/20 10:00 PM   Specimen: Nasopharyngeal Swab; Nasopharyngeal(NP) swabs in vial transport medium   Result Value Ref Range Status   SARS Coronavirus 2 by RT PCR NEGATIVE NEGATIVE Final    Comment: (NOTE) SARS-CoV-2 target nucleic acids are NOT DETECTED.  The SARS-CoV-2 RNA is generally detectable in upper respiratory specimens during the acute phase of infection. The lowest concentration of SARS-CoV-2 viral copies this assay can detect is 138 copies/mL. A negative result does not preclude SARS-Cov-2 infection and should not be used as the sole basis for treatment or other patient management decisions. A negative result may occur with  improper specimen collection/handling, submission of specimen other than nasopharyngeal swab, presence of viral mutation(s) within the areas targeted by this assay, and inadequate number of viral copies(<138 copies/mL). A negative result must be combined with clinical observations, patient history, and epidemiological information. The expected result is Negative.  Fact Sheet for Patients:  EntrepreneurPulse.com.au  Fact Sheet for Healthcare Providers:  IncredibleEmployment.be  This test is no t yet approved or cleared by the [place] FDA and  has been authorized for detection and/or diagnosis of SARS-CoV-2 by FDA under an Emergency Use Authorization (EUA). This EUA will remain  in effect (meaning this test can be used) for the duration of the COVID-19 declaration under Section 564(b)(1) of the Act, 21 U.S.C.section 360bbb-3(b)(1), unless the authorization is terminated  or revoked sooner.       Influenza A by PCR NEGATIVE NEGATIVE Final   Influenza B by PCR NEGATIVE NEGATIVE Final    Comment: (NOTE) The Xpert Xpress SARS-CoV-2/FLU/RSV  plus assay is intended as an aid in the diagnosis of influenza from Nasopharyngeal swab specimens and should not be used as a sole basis for treatment. Nasal washings and aspirates are unacceptable for Xpert Xpress SARS-CoV-2/FLU/RSV testing.  Fact Sheet for  Patients: EntrepreneurPulse.com.au  Fact Sheet for Healthcare Providers: IncredibleEmployment.be  This test is not yet approved or cleared by the [place] FDA and has been authorized for detection and/or diagnosis of SARS-CoV-2 by FDA under an Emergency Use Authorization (EUA). This EUA will remain in effect (meaning this test can be used) for the duration of the COVID-19 declaration under Section 564(b)(1) of the Act, 21 U.S.C. section 360bbb-3(b)(1), unless the authorization is terminated or revoked.  Performed at [facility], [place] [address], [place], [place] [address]   Culture, blood (routine x 2)     Status: None (Preliminary result)   Collection Time: 09/25/20  9:29 AM   Specimen: BLOOD  Result Value Ref Range Status   Specimen Description BLOOD LEFT ANTECUBITAL  Final   Special Requests   Final    BOTTLES DRAWN AEROBIC AND ANAEROBIC Blood Culture results may not be optimal due to an inadequate volume of blood received in culture bottles   Culture   Final    NO GROWTH 3 DAYS Performed at [facility], [place] [address] [place], [place] [address]    Report Status PENDING  Incomplete  Culture, blood (routine x 2)     Status: None (Preliminary result)   Collection Time: 09/25/20  9:38 AM   Specimen: BLOOD  Result Value Ref Range Status   Specimen Description BLOOD LEFT ANTECUBITAL  Final   Special Requests   Final    BOTTLES DRAWN AEROBIC ONLY Blood Culture adequate volume   Culture   Final    NO GROWTH 3 DAYS Performed at [facility], [place] [address], [place], [place] [address]    Report Status PENDING  Incomplete  MRSA PCR Screening     Status: None   Collection Time: 09/26/20  8:00 AM   Specimen: Nasal Mucosa; Nasopharyngeal  Result Value Ref Range Status   MRSA by PCR NEGATIVE NEGATIVE Final    Comment:        The GeneXpert MRSA Assay (FDA approved for NASAL specimens only), is one component of  a comprehensive MRSA colonization surveillance program. It is not intended to diagnose MRSA infection nor to guide or monitor treatment for MRSA infections. Performed at [facility], [place] [address] [place], [place] [address]       Studies: No results found.  Scheduled Meds: . aspirin EC  81 mg Oral Daily  . atorvastatin  40 mg Oral Daily  . enoxaparin (LOVENOX) injection  65 mg Subcutaneous Daily  . insulin aspart  0-20 Units Subcutaneous TID WC  . insulin aspart protamine- aspart  30 Units Subcutaneous BID WC  . lidocaine  1 patch Transdermal Daily  . metoprolol tartrate  50 mg Oral BID  . sodium chloride flush  3 mL Intravenous Q12H    Continuous Infusions: .  ceFAZolin (ANCEF) IV 2 g (09/28/20 1148)     LOS: 4 days     Kayleen Memos, MD [facility] Pager [phone]  If 7PM-7AM, please contact night-coverage [email or web address] Password [facility] 09/28/2020, 12:41 PM

## 2020-09-28 NOTE — Progress Notes (Signed)
Pharmacy Antibiotic Note  Riley Hood is a 55 y.o. male admitted on 09/23/2020 with Bacteremia.  Pharmacy has been consulted for Ancef dosing.  ID: Sepsis  2/2 E.coli bacteremia of unknown sourse. - Tmax 101. WBC WNL. Scr <1 - PC 59.91>37.4>17.63  Ceftriaxone 3/10>> Vanc x1 3/10 >3/10 Zosyn x1 3/10>3/10   3/11 BCx : ngtd 3/10 BCx: GNR >> E. Coli 3/9 covid: neg, flu: neg  Plan: D/c Rocephin Ancef 2g IV q8hr.    Height: 6' (182.9 cm) (recorded on0 12/16/20) Weight: 131.5 kg (290 lb) (recorded on0 12/18/20) IBW/kg (Calculated) : 77.6  Temp (24hrs), Avg:98.9 F (37.2 C), Min:97.7 F (36.5 C), Max:101 F (38.3 C)  Recent Labs  Lab 09/23/20 2341 09/24/20 1452 09/24/20 1809 09/24/20 2054 09/24/20 2059 09/25/20 0053 09/25/20 0938 09/26/20 0200 09/26/20 1646 09/27/20 0121 09/28/20 0654  WBC  --   --   --   --  8.8 11.0*  --  10.3 9.7 9.0 8.0  CREATININE  --   --   --   --  1.41* 1.43*  --  1.67*  --  1.28* 0.93  LATICACIDVEN 2.0* 2.9* 1.9 2.6*  --   --  1.2  --   --   --   --     Estimated Creatinine Clearance: 125.9 mL/min (by C-G formula based on SCr of 0.93 mg/dL).    No Known Allergies  Riley Hood, PharmD, BCPS Clinical Staff Pharmacist [email or web address]  Riley Hood 09/28/2020 11:18 AM

## 2020-09-28 NOTE — Progress Notes (Signed)
Critical pO2 of less than 31. MD [place] Crane paged.
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
## 2020-09-29 ENCOUNTER — Inpatient Hospital Stay (HOSPITAL_COMMUNITY): Payer: Self-pay | Admitting: Anesthesiology

## 2020-09-29 ENCOUNTER — Inpatient Hospital Stay (HOSPITAL_COMMUNITY): Payer: Self-pay

## 2020-09-29 ENCOUNTER — Encounter (HOSPITAL_COMMUNITY)
Admission: EM | Disposition: A | Discharge status: Home Health | Payer: Self-pay | Source: Home / Self Care | Attending: Internal Medicine

## 2020-09-29 ENCOUNTER — Encounter (HOSPITAL_COMMUNITY): Payer: Self-pay | Admitting: Family Medicine

## 2020-09-29 DIAGNOSIS — M25562 Pain in left knee: Secondary | ICD-10-CM

## 2020-09-29 DIAGNOSIS — R7881 Bacteremia: Secondary | ICD-10-CM

## 2020-09-29 DIAGNOSIS — I350 Nonrheumatic aortic (valve) stenosis: Secondary | ICD-10-CM

## 2020-09-29 DIAGNOSIS — M5442 Lumbago with sciatica, left side: Secondary | ICD-10-CM

## 2020-09-29 HISTORY — PX: BUBBLE STUDY: SHX6837

## 2020-09-29 HISTORY — PX: TEE WITHOUT CARDIOVERSION: SHX5443

## 2020-09-29 LAB — CBC WITH DIFFERENTIAL/PLATELET
Abs Immature Granulocytes: 0.16 10*3/uL — ABNORMAL HIGH (ref 0.00–0.07)
Basophils Absolute: 0.1 10*3/uL (ref 0.0–0.1)
Basophils Relative: 1 %
Eosinophils Absolute: 0.1 10*3/uL (ref 0.0–0.5)
Eosinophils Relative: 1 %
HCT: 28.7 % — ABNORMAL LOW (ref 39.0–52.0)
Hemoglobin: 9.9 g/dL — ABNORMAL LOW (ref 13.0–17.0)
Immature Granulocytes: 2 %
Lymphocytes Relative: 29 %
Lymphs Abs: 2.3 10*3/uL (ref 0.7–4.0)
MCH: 25.3 pg — ABNORMAL LOW (ref 26.0–34.0)
MCHC: 34.5 g/dL (ref 30.0–36.0)
MCV: 73.2 fL — ABNORMAL LOW (ref 80.0–100.0)
Monocytes Absolute: 1.1 10*3/uL — ABNORMAL HIGH (ref 0.1–1.0)
Monocytes Relative: 14 %
Neutro Abs: 4.3 10*3/uL (ref 1.7–7.7)
Neutrophils Relative %: 53 %
Platelets: 193 10*3/uL (ref 150–400)
RBC: 3.92 MIL/uL — ABNORMAL LOW (ref 4.22–5.81)
RDW: 14.6 % (ref 11.5–15.5)
WBC: 8 10*3/uL (ref 4.0–10.5)
nRBC: 0 % (ref 0.0–0.2)

## 2020-09-29 LAB — PROCALCITONIN: Procalcitonin: 9.15 ng/mL

## 2020-09-29 LAB — COMPREHENSIVE METABOLIC PANEL
ALT: 43 U/L (ref 0–44)
AST: 39 U/L (ref 15–41)
Albumin: 1.9 g/dL — ABNORMAL LOW (ref 3.5–5.0)
Alkaline Phosphatase: 76 U/L (ref 38–126)
Anion gap: 6 (ref 5–15)
BUN: 12 mg/dL (ref 6–20)
CO2: 26 mmol/L (ref 22–32)
Calcium: 8.5 mg/dL — ABNORMAL LOW (ref 8.9–10.3)
Chloride: 103 mmol/L (ref 98–111)
Creatinine, Ser: 0.92 mg/dL (ref 0.61–1.24)
GFR, Estimated: >60 mL/min (ref >60)
Glucose, Bld: 108 mg/dL — ABNORMAL HIGH (ref 70–99)
Potassium: 3.7 mmol/L (ref 3.5–5.1)
Sodium: 135 mmol/L (ref 135–145)
Total Bilirubin: 0.5 mg/dL (ref 0.3–1.2)
Total Protein: 5.8 g/dL — ABNORMAL LOW (ref 6.5–8.1)

## 2020-09-29 LAB — GLUCOSE, CAPILLARY
Glucose-Capillary: 122 mg/dL — ABNORMAL HIGH (ref 70–99)
Glucose-Capillary: 102 mg/dL — ABNORMAL HIGH (ref 70–99)
Glucose-Capillary: 119 mg/dL — ABNORMAL HIGH (ref 70–99)
Glucose-Capillary: 130 mg/dL — ABNORMAL HIGH (ref 70–99)

## 2020-09-29 LAB — SYNOVIAL CELL COUNT + DIFF, W/ CRYSTALS
Crystals, Fluid: NONE SEEN
Eosinophils-Synovial: 0 % (ref 0–1)
Lymphocytes-Synovial Fld: 4 % (ref 0–20)
Monocyte-Macrophage-Synovial Fluid: 6 % — ABNORMAL LOW (ref 50–90)
Neutrophil, Synovial: 90 % — ABNORMAL HIGH (ref 0–25)
WBC, Synovial: 635 /mm3 — ABNORMAL HIGH (ref 0–200)

## 2020-09-29 LAB — ECHO TEE
AR max vel: 0.95 cm2
AV Area VTI: 0.91 cm2
AV Area mean vel: 0.88 cm2
AV Mean grad: 50 mmHg
AV Peak grad: 77.4 mmHg
Ao pk vel: 4.4 m/s

## 2020-09-29 LAB — C-REACTIVE PROTEIN: CRP: 26.4 mg/dL — ABNORMAL HIGH (ref <1.0)

## 2020-09-29 LAB — SEDIMENTATION RATE: Sed Rate: 113 mm/hr — ABNORMAL HIGH (ref 0–16)

## 2020-09-29 SURGERY — ECHOCARDIOGRAM, TRANSESOPHAGEAL
Anesthesia: Monitor Anesthesia Care

## 2020-09-29 MED ORDER — BUTAMBEN-TETRACAINE-BENZOCAINE 2-2-14 % EX AERO
INHALATION_SPRAY | CUTANEOUS | Status: DC | PRN
  Administered 2020-09-29: 2 via TOPICAL

## 2020-09-29 MED ORDER — PROPOFOL 500 MG/50ML IV EMUL
INTRAVENOUS | Status: DC | PRN
  Administered 2020-09-29: 125 ug/kg/min via INTRAVENOUS

## 2020-09-29 MED ORDER — LIDOCAINE 2% (20 MG/ML) 5 ML SYRINGE
INTRAMUSCULAR | Status: DC | PRN
  Administered 2020-09-29: 100 mg via INTRAVENOUS

## 2020-09-29 MED ORDER — GLYCOPYRROLATE PF 0.2 MG/ML IJ SOSY
PREFILLED_SYRINGE | INTRAMUSCULAR | Status: DC | PRN
  Administered 2020-09-29 (×2): .1 mg via INTRAVENOUS

## 2020-09-29 MED ORDER — METHYLPREDNISOLONE ACETATE 80 MG/ML IJ SUSP
80.0000 mg | Freq: Once | INTRAMUSCULAR | Status: DC
  Filled 2020-09-29 (×2): qty 1

## 2020-09-29 MED ORDER — DEXMEDETOMIDINE (PRECEDEX) IN NS 20 MCG/5ML (4 MCG/ML) IV SYRINGE
PREFILLED_SYRINGE | INTRAVENOUS | Status: DC | PRN
  Administered 2020-09-29: 16 ug via INTRAVENOUS

## 2020-09-29 MED ORDER — PROPOFOL 10 MG/ML IV BOLUS
INTRAVENOUS | Status: DC | PRN
  Administered 2020-09-29: 20 mg via INTRAVENOUS
  Administered 2020-09-29: 10 mg via INTRAVENOUS
  Administered 2020-09-29 (×2): 20 mg via INTRAVENOUS

## 2020-09-29 MED ORDER — BUPIVACAINE HCL (PF) 0.5 % IJ SOLN
10.0000 mL | Freq: Once | INTRAMUSCULAR | Status: DC
  Filled 2020-09-29 (×2): qty 10

## 2020-09-29 NOTE — Anesthesia Procedure Notes (Signed)
Procedure Name: MAC Performed by: Valda Favia, CRNA Pre-anesthesia Checklist: Patient identified, Emergency Drugs available, Suction available, Patient being monitored and Timeout performed Patient Re-evaluated:Patient Re-evaluated prior to induction Oxygen Delivery Method: Nasal cannula Airway Equipment and Method: Bite block Placement Confirmation: positive ETCO2 Dental Injury: Teeth and Oropharynx as per pre-operative assessment

## 2020-09-29 NOTE — CV Procedure (Signed)
INDICATIONS: Bacteremia  PROCEDURE:   Informed consent was obtained prior to the procedure. The risks, benefits and alternatives for the procedure were discussed and the patient comprehended these risks.  Risks include, but are not limited to, cough, sore throat, vomiting, nausea, somnolence, esophageal and stomach trauma or perforation, bleeding, low blood pressure, aspiration, pneumonia, infection, trauma to the teeth and death.    After a procedural time-out, the oropharynx was anesthetized with 20% benzocaine spray.   During this procedure the patient was administered propofol and precedex per anesthesia.  The patient's heart rate, blood pressure, and oxygen saturation were monitored continuously during the procedure. The period of conscious sedation was 30 minutes, of which I was present face-to-face 100% of this time.  The transesophageal probe was inserted in the esophagus and stomach without difficulty and multiple views were obtained.  The patient was kept under observation until the patient left the procedure room.  The patient left the procedure room in stable condition.   Agitated microbubble saline contrast was administered.  COMPLICATIONS:    There were no immediate complications.  FINDINGS:   FORMAL ECHOCARDIOGRAM REPORT PENDING No valvular vegetations seen.  Severe aortic valve stenosis. Valve appears tricuspid with severe calcifications. Mean gradient 50 mmHg. At least moderate left ventricular hypertrophy.  LVEF 65-70%  RECOMMENDATIONS:    Can return to hospital room when alert.  Time Spent Directly with the Patient:  60 minutes   Riley Hood 09/29/2020, 9:06 AM

## 2020-09-29 NOTE — Progress Notes (Signed)
Patient removed CPAP mask.  States that he cannot breathe with the mask on.  Patient refuses CPAP for the remainder of the night but states that he will try again tomorrow night.
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
## 2020-09-29 NOTE — Procedures (Signed)
Procedure: Left knee aspiration and injection  Indication: Left knee effusion(s)  Surgeon: Silvestre Gunner, PA-C  Assist: None  Anesthesia: Topical refigerant  EBL: None  Complications: None  Findings: After risks/benefits explained patient desires to undergo procedure. Consent obtained and time out performed. The left knee was sterilely prepped and aspirated. A very small amount of bloody fluid obtained from a lateral suprapatellar approach. Knee was reprepped and joint accessed via lateral infrapatellar approach. Again a very small amount of pale yellow fluid obtained. 33ml Marcaine and 80mg  depomedrol instilled. Pt tolerated the procedure well.    Lisette Abu, PA-C Orthopedic Surgery [phone]

## 2020-09-29 NOTE — Progress Notes (Addendum)
Subjective: "is this why my knee hurts to much"   Antibiotics:  Anti-infectives (From admission, onward)   Start     Dose/Rate Route Frequency Ordered Stop   09/28/20 1100  ceFAZolin (ANCEF) IVPB 2g/100 mL premix        2 g 200 mL/hr over 30 Minutes Intravenous Every 8 hours 09/28/20 1016     09/24/20 2000  vancomycin (VANCOREADY) IVPB 1000 mg/200 mL  Status:  Discontinued        1,000 mg 200 mL/hr over 60 Minutes Intravenous Every 12 hours 09/24/20 0820 09/24/20 1139   09/24/20 1400  piperacillin-tazobactam (ZOSYN) IVPB 3.375 g  Status:  Discontinued        3.375 g 12.5 mL/hr over 240 Minutes Intravenous Every 8 hours 09/24/20 0742 09/24/20 0858   09/24/20 1400  piperacillin-tazobactam (ZOSYN) IVPB 3.375 g  Status:  Discontinued        3.375 g 12.5 mL/hr over 240 Minutes Intravenous Every 8 hours 09/24/20 0820 09/24/20 1139   09/24/20 1400  cefTRIAXone (ROCEPHIN) 2 g in sodium chloride 0.9 % 100 mL IVPB  Status:  Discontinued        2 g 200 mL/hr over 30 Minutes Intravenous Every 24 hours 09/24/20 1141 09/28/20 1118   09/24/20 0615  vancomycin (VANCOREADY) IVPB 2000 mg/400 mL        2,000 mg 200 mL/hr over 120 Minutes Intravenous  Once 09/24/20 0608 09/24/20 0853   09/24/20 0615  piperacillin-tazobactam (ZOSYN) IVPB 3.375 g        3.375 g 12.5 mL/hr over 240 Minutes Intravenous  Once 09/24/20 0608 09/24/20 1019      Medications: Scheduled Meds: . aspirin EC  81 mg Oral Daily  . atorvastatin  40 mg Oral Daily  . bupivacaine  10 mL Infiltration Once  . enoxaparin (LOVENOX) injection  65 mg Subcutaneous Daily  . insulin aspart  0-20 Units Subcutaneous TID WC  . insulin aspart protamine- aspart  30 Units Subcutaneous BID WC  . lidocaine  1 patch Transdermal Daily  . methylPREDNISolone acetate  80 mg Intra-articular Once  . metoprolol tartrate  50 mg Oral BID  . sodium chloride flush  3 mL Intravenous Q12H   Continuous Infusions: .  ceFAZolin (ANCEF) IV 2 g  (09/29/20 1042)   PRN Meds:.acetaminophen **OR** acetaminophen, albuterol, HYDROcodone-acetaminophen, methocarbamol, metoprolol tartrate, ondansetron **OR** ondansetron (ZOFRAN) IV, phenol    Objective: Weight change:   Intake/Output Summary (Last 24 hours) at 09/29/2020 1519 Last data filed at 09/29/2020 0912 Gross per 24 hour  Intake 740 ml  Output 2650 ml  Net -1910 ml   Blood pressure (!) 120/97, pulse (!) 103, temperature 99.6 F (37.6 C), temperature source Oral, resp. rate 20, height 6' (1.829 m), weight 129.3 kg, SpO2 95 %. Temp:  [97.9 F (36.6 C)-99.6 F (37.6 C)] 99.6 F (37.6 C) (03/15 1146) Pulse Rate:  [86-103] 103 (03/15 1146) Resp:  [16-25] 20 (03/15 1146) BP: (120-145)/(69-97) 120/97 (03/15 1146) SpO2:  [95 %-100 %] 95 % (03/15 1146) Weight:  [129.3 kg] 129.3 kg (03/15 0738)  Physical Exam: Physical Exam Constitutional:      Appearance: He is well-developed. He is obese.  HENT:     Head: Normocephalic and atraumatic.  Eyes:     Conjunctiva/sclera: Conjunctivae normal.  Cardiovascular:     Rate and Rhythm: Normal rate and regular rhythm.     Heart sounds: Murmur heard.    Pulmonary:     Effort:  Pulmonary effort is normal. No respiratory distress.     Breath sounds: No wheezing.  Abdominal:     General: There is no distension.     Palpations: Abdomen is soft.  Musculoskeletal:     Cervical back: Normal range of motion and neck supple.     Left knee: Swelling and effusion present. Decreased range of motion. Tenderness present.  Skin:    General: Skin is warm and dry.     Findings: No erythema or rash.  Neurological:     Mental Status: He is alert and oriented to person, place, and time.  Psychiatric:        Behavior: Behavior normal.        Thought Content: Thought content normal.        Judgment: Judgment normal.      CBC:    BMET Recent Labs    09/28/20 0654 09/29/20 0048  NA 135 135  K 3.6 3.7  CL 105 103  CO2 24 26  GLUCOSE  162* 108*  BUN 15 12  CREATININE 0.93 0.92  CALCIUM 8.2* 8.5*     Liver Panel  Recent Labs    09/27/20 0121 09/29/20 0048  PROT 5.7* 5.8*  ALBUMIN 1.9* 1.9*  AST 44* 39  ALT 39 43  ALKPHOS 100 76  BILITOT 0.4 0.5       Sedimentation Rate Recent Labs    09/29/20 0048  ESRSEDRATE 113*   C-Reactive Protein Recent Labs    09/29/20 0048  CRP 26.4*    Micro Results: Recent Results (from the past 720 hour(s))  Urine culture     Status: None   Collection Time: 09/15/20 12:35 PM   Specimen: Urine, Random  Result Value Ref Range Status   Specimen Description URINE, RANDOM  Final   Special Requests NONE  Final   Culture   Final    NO GROWTH Performed at [facility], [address], [place], [place] [address]    Report Status 09/16/2020 FINAL  Final  SARS CORONAVIRUS 2 (TAT 6-24 HRS) Nasopharyngeal Nasopharyngeal Swab     Status: None   Collection Time: 09/15/20  2:34 PM   Specimen: Nasopharyngeal Swab  Result Value Ref Range Status   SARS Coronavirus 2 NEGATIVE NEGATIVE Final    Comment: (NOTE) SARS-CoV-2 target nucleic acids are NOT DETECTED.  The SARS-CoV-2 RNA is generally detectable in upper and lower respiratory specimens during the acute phase of infection. Negative results do not preclude SARS-CoV-2 infection, do not rule out co-infections with other pathogens, and should not be used as the sole basis for treatment or other patient management decisions. Negative results must be combined with clinical observations, patient history, and epidemiological information. The expected result is Negative.  Fact Sheet for Patients: SugarRoll.be  Fact Sheet for Healthcare Providers: https://www.woods-mathews.com/  This test is not yet approved or cleared by the [place] FDA and  has been authorized for detection and/or diagnosis of SARS-CoV-2 by FDA under an Emergency Use Authorization (EUA). This EUA will  remain  in effect (meaning this test can be used) for the duration of the COVID-19 declaration under Se ction 564(b)(1) of the Act, 21 U.S.C. section 360bbb-3(b)(1), unless the authorization is terminated or revoked sooner.  Performed at [facility], [place] [address], [place], [place] [address]   Blood culture (routine x 2)     Status: Abnormal   Collection Time: 09/23/20  9:23 PM   Specimen: BLOOD RIGHT HAND  Result  Value Ref Range Status   Specimen Description BLOOD RIGHT HAND  Final   Special Requests   Final    BOTTLES DRAWN AEROBIC AND ANAEROBIC Blood Culture adequate volume   Culture  Setup Time   Final    GRAM NEGATIVE RODS IN BOTH AEROBIC AND ANAEROBIC BOTTLES CRITICAL VALUE NOTED.  VALUE IS CONSISTENT WITH PREVIOUSLY REPORTED AND CALLED VALUE.    Culture (A)  Final    ESCHERICHIA COLI SUSCEPTIBILITIES PERFORMED ON PREVIOUS CULTURE WITHIN THE LAST 5 DAYS. Performed at [facility], [place] [address] [place], [place] [address]    Report Status 09/27/2020 FINAL  Final  Blood culture (routine x 2)     Status: Abnormal   Collection Time: 09/23/20  9:23 PM   Specimen: BLOOD  Result Value Ref Range Status   Specimen Description BLOOD RIGHT ANTECUBITAL  Final   Special Requests   Final    BOTTLES DRAWN AEROBIC AND ANAEROBIC Blood Culture results may not be optimal due to an excessive volume of blood received in culture bottles   Culture  Setup Time   Final    GRAM NEGATIVE RODS IN BOTH AEROBIC AND ANAEROBIC BOTTLES CRITICAL RESULT CALLED TO, READ BACK BY AND VERIFIED WITH: Sharen Heck PharmD 11:10 09/24/20 ([email or web address]) Performed at [facility], [place] [address] [place],  [address]    Culture ESCHERICHIA COLI (A)  Final   Report Status 09/27/2020 FINAL  Final   Organism ID, Bacteria ESCHERICHIA COLI  Final      Susceptibility   Escherichia coli - MIC*    AMPICILLIN >=32 RESISTANT Resistant     CEFAZOLIN <=4 SENSITIVE Sensitive     CEFEPIME <=0.12  SENSITIVE Sensitive     CEFTAZIDIME <=1 SENSITIVE Sensitive     CEFTRIAXONE <=0.25 SENSITIVE Sensitive     CIPROFLOXACIN <=0.25 SENSITIVE Sensitive     GENTAMICIN <=1 SENSITIVE Sensitive     IMIPENEM <=0.25 SENSITIVE Sensitive     TRIMETH/SULFA >=320 RESISTANT Resistant     AMPICILLIN/SULBACTAM 16 INTERMEDIATE Intermediate     PIP/TAZO <=4 SENSITIVE Sensitive     * ESCHERICHIA COLI  Blood Culture ID Panel (Reflexed)     Status: Abnormal   Collection Time: 09/23/20  9:23 PM  Result Value Ref Range Status   Enterococcus faecalis NOT DETECTED NOT DETECTED Final   Enterococcus Faecium NOT DETECTED NOT DETECTED Final   Listeria monocytogenes NOT DETECTED NOT DETECTED Final   Staphylococcus species NOT DETECTED NOT DETECTED Final   Staphylococcus aureus (BCID) NOT DETECTED NOT DETECTED Final   Staphylococcus epidermidis NOT DETECTED NOT DETECTED Final   Staphylococcus lugdunensis NOT DETECTED NOT DETECTED Final   Streptococcus species NOT DETECTED NOT DETECTED Final   Streptococcus agalactiae NOT DETECTED NOT DETECTED Final   Streptococcus pneumoniae NOT DETECTED NOT DETECTED Final   Streptococcus pyogenes NOT DETECTED NOT DETECTED Final   A.calcoaceticus-baumannii NOT DETECTED NOT DETECTED Final   Bacteroides fragilis NOT DETECTED NOT DETECTED Final   Enterobacterales DETECTED (A) NOT DETECTED Final    Comment: Enterobacterales represent a large order of gram negative bacteria, not a single organism. CRITICAL RESULT CALLED TO, READ BACK BY AND VERIFIED WITH: Sharen Heck PharmD 11:10 09/24/20 ([email or web address])    Enterobacter cloacae complex NOT DETECTED NOT DETECTED Final   Escherichia coli DETECTED (A) NOT DETECTED Final   Klebsiella aerogenes NOT DETECTED NOT DETECTED Final   Klebsiella oxytoca NOT DETECTED NOT DETECTED Final    Comment: CRITICAL RESULT CALLED TO, READ BACK BY AND VERIFIED  WITH: Sharen Heck PharmD 11:15 09/24/20 ([email or web address])    Klebsiella pneumoniae NOT DETECTED NOT  DETECTED Final   Proteus species NOT DETECTED NOT DETECTED Final   Salmonella species NOT DETECTED NOT DETECTED Final   Serratia marcescens NOT DETECTED NOT DETECTED Final   Haemophilus influenzae NOT DETECTED NOT DETECTED Final   Neisseria meningitidis NOT DETECTED NOT DETECTED Final   Pseudomonas aeruginosa NOT DETECTED NOT DETECTED Final   Stenotrophomonas maltophilia NOT DETECTED NOT DETECTED Final   Candida albicans NOT DETECTED NOT DETECTED Final   Candida auris NOT DETECTED NOT DETECTED Final   Candida glabrata NOT DETECTED NOT DETECTED Final   Candida krusei NOT DETECTED NOT DETECTED Final   Candida parapsilosis NOT DETECTED NOT DETECTED Final   Candida tropicalis NOT DETECTED NOT DETECTED Final   Cryptococcus neoformans/gattii NOT DETECTED NOT DETECTED Final   CTX-M ESBL NOT DETECTED NOT DETECTED Final   Carbapenem resistance IMP NOT DETECTED NOT DETECTED Final   Carbapenem resistance KPC NOT DETECTED NOT DETECTED Final   Carbapenem resistance NDM NOT DETECTED NOT DETECTED Final   Carbapenem resist OXA 48 LIKE NOT DETECTED NOT DETECTED Final   Carbapenem resistance VIM NOT DETECTED NOT DETECTED Final    Comment: Performed at [facility], [address] [place], [place] [address]  Resp Panel by RT-PCR (Flu A&B, Covid) Nasopharyngeal Swab     Status: None   Collection Time: 09/23/20 10:00 PM   Specimen: Nasopharyngeal Swab; Nasopharyngeal(NP) swabs in vial transport medium  Result Value Ref Range Status   SARS Coronavirus 2 by RT PCR NEGATIVE NEGATIVE Final    Comment: (NOTE) SARS-CoV-2 target nucleic acids are NOT DETECTED.  The SARS-CoV-2 RNA is generally detectable in upper respiratory specimens during the acute phase of infection. The lowest concentration of SARS-CoV-2 viral copies this assay can detect is 138 copies/mL. A negative result does not preclude SARS-Cov-2 infection and should not be used as the sole basis for treatment or other patient management  decisions. A negative result may occur with  improper specimen collection/handling, submission of specimen other than nasopharyngeal swab, presence of viral mutation(s) within the areas targeted by this assay, and inadequate number of viral copies(<138 copies/mL). A negative result must be combined with clinical observations, patient history, and epidemiological information. The expected result is Negative.  Fact Sheet for Patients:  EntrepreneurPulse.com.au  Fact Sheet for Healthcare Providers:  IncredibleEmployment.be  This test is no t yet approved or cleared by the [place] FDA and  has been authorized for detection and/or diagnosis of SARS-CoV-2 by FDA under an Emergency Use Authorization (EUA). This EUA will remain  in effect (meaning this test can be used) for the duration of the COVID-19 declaration under Section 564(b)(1) of the Act, 21 U.S.C.section 360bbb-3(b)(1), unless the authorization is terminated  or revoked sooner.       Influenza A by PCR NEGATIVE NEGATIVE Final   Influenza B by PCR NEGATIVE NEGATIVE Final    Comment: (NOTE) The Xpert Xpress SARS-CoV-2/FLU/RSV plus assay is intended as an aid in the diagnosis of influenza from Nasopharyngeal swab specimens and should not be used as a sole basis for treatment. Nasal washings and aspirates are unacceptable for Xpert Xpress SARS-CoV-2/FLU/RSV testing.  Fact Sheet for Patients: EntrepreneurPulse.com.au  Fact Sheet for Healthcare Providers: IncredibleEmployment.be  This test is not yet approved or cleared by the [place] FDA and has been authorized for detection and/or diagnosis of SARS-CoV-2 by FDA under an Emergency Use Authorization (EUA). This EUA will remain  in effect (meaning this test can be used) for the duration of the COVID-19 declaration under Section 564(b)(1) of the Act, 21 U.S.C. section 360bbb-3(b)(1), unless the  authorization is terminated or revoked.  Performed at [facility], [place] [address], [place], [place] [address]   Culture, blood (routine x 2)     Status: None (Preliminary result)   Collection Time: 09/25/20  9:29 AM   Specimen: BLOOD  Result Value Ref Range Status   Specimen Description BLOOD LEFT ANTECUBITAL  Final   Special Requests   Final    BOTTLES DRAWN AEROBIC AND ANAEROBIC Blood Culture results may not be optimal due to an inadequate volume of blood received in culture bottles   Culture   Final    NO GROWTH 4 DAYS Performed at [facility], [place] [address], [place], [place] [address]    Report Status PENDING  Incomplete  Culture, blood (routine x 2)     Status: None (Preliminary result)   Collection Time: 09/25/20  9:38 AM   Specimen: BLOOD  Result Value Ref Range Status   Specimen Description BLOOD LEFT ANTECUBITAL  Final   Special Requests   Final    BOTTLES DRAWN AEROBIC ONLY Blood Culture adequate volume   Culture   Final    NO GROWTH 4 DAYS Performed at [facility], [place] [address], [place], [place] [address]    Report Status PENDING  Incomplete  MRSA PCR Screening     Status: None   Collection Time: 09/26/20  8:00 AM   Specimen: Nasal Mucosa; Nasopharyngeal  Result Value Ref Range Status   MRSA by PCR NEGATIVE NEGATIVE Final    Comment:        The GeneXpert MRSA Assay (FDA approved for NASAL specimens only), is one component of a comprehensive MRSA colonization surveillance program. It is not intended to diagnose MRSA infection nor to guide or monitor treatment for MRSA infections. Performed at [facility], [place] [address], [place], [place] [address]   Culture, blood (Routine X 2) w Reflex to ID Panel     Status: None (Preliminary result)   Collection Time: 09/28/20 12:13 PM   Specimen: BLOOD LEFT HAND  Result Value Ref Range Status   Specimen Description BLOOD LEFT HAND  Final   Special Requests   Final    BOTTLES DRAWN  AEROBIC AND ANAEROBIC Blood Culture adequate volume   Culture   Final    NO GROWTH < 24 HOURS Performed at [facility], [place] [address], [place], [place] [address]    Report Status PENDING  Incomplete  Culture, blood (Routine X 2) w Reflex to ID Panel     Status: None (Preliminary result)   Collection Time: 09/28/20 12:13 PM   Specimen: BLOOD LEFT WRIST  Result Value Ref Range Status   Specimen Description BLOOD LEFT WRIST  Final   Special Requests   Final    BOTTLES DRAWN AEROBIC AND ANAEROBIC Blood Culture adequate volume   Culture   Final    NO GROWTH < 24 HOURS Performed at [facility], [place] [address] [place],  [address]    Report Status PENDING  Incomplete    Studies/Results: ECHO TEE  Result Date: 09/29/2020    TRANSESOPHOGEAL ECHO REPORT   Patient Name:   WILBERT HAYASHI Date of Exam: 09/29/2020 Medical Rec #:  [id number]         Height:  72.0 in Accession #:    [id number]        Weight:       285.0 lb Date of Birth:  Feb 07, 1966          BSA:          2.477 m Patient Age:    55 years          BP:           121/69 mmHg Patient Gender: M                 HR:           97 bpm. Exam Location:  Inpatient Procedure: Transesophageal Echo, Color Doppler, Cardiac Doppler and 3D Echo Indications:     Bacteremia  History:         Patient has prior history of Echocardiogram examinations, most                  recent 09/24/2020. Aortic Valve Disease; Risk                  Factors:Hypertension, Diabetes and Dyslipidemia.  Sonographer:     Mikki Santee RDCS (AE) Referring Phys:  [place] Diagnosing Phys: Cherlynn Kaiser MD PROCEDURE: After discussion of the risks and benefits of a TEE, an informed consent was obtained from the patient. TEE procedure time was 24 minutes. The transesophogeal probe was passed without difficulty through the esophogus of the patient. Imaged were obtained with the patient in a left lateral decubitus position. Local oropharyngeal  anesthetic was provided with Cetacaine. Sedation performed by different physician. The patient was monitored while under deep sedation. Anesthestetic sedation was provided intravenously by Anesthesiology: 587.2mg  of Propofol, 100mg  of Lidocaine. Image quality was good. The patient's vital signs; including heart rate, blood pressure, and oxygen saturation; remained stable throughout the procedure. The patient developed no complications during the procedure. IMPRESSIONS  1. The aortic valve is tricuspid. There is severe calcifcation of the aortic valve. Aortic valve regurgitation is trivial. Severe aortic valve stenosis. Aortic valve area, by VTI measures 0.91 cm. Aortic valve mean gradient measures 50.0 mmHg. Aortic valve Vmax measures 4.40 m/s.  2. Left ventricular ejection fraction, by estimation, is 65 to 70%. The left ventricle has normal function. There is moderate concentric left ventricular hypertrophy.  3. Right ventricular systolic function is normal. The right ventricular size is normal.  4. Left atrial size was mildly dilated. 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 The LAA emptying velocity was 100 cm/s.  5. The mitral valve is normal in structure. Trivial mitral valve regurgitation. No evidence of mitral stenosis. Conclusion(s)/Recommendation(s): No evidence of vegetation/infective endocarditis on this transesophageal echocardiogram. FINDINGS  Left Ventricle: Left ventricular ejection fraction, by estimation, is 65 to 70%. The left ventricle has normal function. The left ventricular internal cavity size was normal in size. There is moderate concentric left ventricular hypertrophy. Right Ventricle: The right ventricular size is normal. No increase in right ventricular wall thickness. Right ventricular systolic function is normal. Left Atrium: Left atrial size was mildly dilated. 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 The LAA emptying velocity was 100 cm/s. Right  Atrium: Right atrial size was normal in size. Pericardium: Trivial pericardial effusion is present. Mitral Valve: The mitral valve is normal in structure. Trivial mitral valve regurgitation. No evidence of mitral valve stenosis. Tricuspid Valve: The tricuspid valve is normal in structure. Tricuspid valve regurgitation is trivial. There is no evidence of tricuspid  valve vegetation. Aortic Valve: The aortic valve is tricuspid. There is severe calcifcation of the aortic valve. Aortic valve regurgitation is trivial. Severe aortic stenosis is present. Aortic valve mean gradient measures 50.0 mmHg. Aortic valve peak gradient measures 77.4 mmHg. Aortic valve area, by VTI measures 0.91 cm. Pulmonic Valve: The pulmonic valve was normal in structure. Pulmonic valve regurgitation is trivial. Aorta: The aortic root and ascending aorta are structurally normal, with no evidence of dilitation. There is minimal (Grade I) plaque. IAS/Shunts: No atrial level shunt detected by color flow Doppler. Agitated saline contrast was given intravenously to evaluate for intracardiac shunting.  LEFT VENTRICLE PLAX 2D LVOT diam:     2.30 cm LV SV:         76 LV SV Index:   31 LVOT Area:     4.15 cm  AORTIC VALVE AV Area (Vmax):    0.95 cm AV Area (Vmean):   0.88 cm AV Area (VTI):     0.91 cm AV Vmax:           440.00 cm/s AV Vmean:          333.000 cm/s AV VTI:            0.841 m AV Peak Grad:      77.4 mmHg AV Mean Grad:      50.0 mmHg LVOT Vmax:         101.00 cm/s LVOT Vmean:        70.600 cm/s LVOT VTI:          0.184 m LVOT/AV VTI ratio: 0.22  AORTA Ao Root diam: 3.40 cm Ao Asc diam:  3.50 cm  SHUNTS Systemic VTI:  0.18 m Systemic Diam: 2.30 cm Cherlynn Kaiser MD Electronically signed by Cherlynn Kaiser MD Signature Date/Time: 09/29/2020/2:12:36 PM    Final       Assessment/Plan:  INTERVAL HISTORY: Patient is now status post aspiration of his knee.  His TEE did not show evidence of endocarditis   Principal Problem:   Sepsis  ([place]) Active Problems:   Type 2 diabetes mellitus with other specified complication ([place])   AKI (acute kidney injury) ([place])   Bacterial infection due to E. coli   Bacteremia due to Escherichia coli   Nonrheumatic aortic valve stenosis    Riley Hood is a 55 y.o. male with admission to the hospital with severe low back pain after having been recently in the hospital with diabetic ketoacidosis.  He was found to be bacteremic with E. coli but without a clear source.  He has been evaluated with MRI of his spine which did not show evidence of discitis or vertebral osteomyelitis.  His inflammatory markers however are markedly elevated.  Transesophageal echocardiogram failed to show any evidence of endocarditis.  He is now complaining of left-sided knee pain and has an effusion present.  I have talked to Dr. Nevada Crane and recommended consultation with orthopedic surgery for aspiration of the joint to send for cell count differential crystals and culture.  Joint was aspirated later this afternoon.  I cannot tell of specimens were sent yet to the lab.  If aspirate of the joint is unrevealing would still consider an MRI of the left knee.  Given his severe pain and elevated telemetry markers I would consider treating him with IV cefazolin to complete a course for discitis that just may not be visible on imaging at this point in time.  I spent greater than 35 minutes with the patient including greater  than 50% of time in face to face counsel of the patient and in coordination of his care.   LOS: 5 days   Alcide Evener 09/29/2020, 3:19 PM

## 2020-09-29 NOTE — Progress Notes (Signed)
  Echocardiogram Echocardiogram Transesophageal has been performed.  Jennette Dubin 09/29/2020, 9:12 AM

## 2020-09-29 NOTE — Progress Notes (Signed)
Patient refused CPAP for the night. Informed patient to call if he changed his mind.

## 2020-09-29 NOTE — Progress Notes (Signed)
PROGRESS NOTE  Riley Hood VHQ:[id number] DOB: 1965/11/09 DOA: 09/23/2020 PCP: Vevelyn Francois, NP  HPI/Recap of past 24 hours:  HPI: Riley Hood is a 55 y.o. 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 Patient had just been recently hospitalized from 3/1-3/3 with diabetic ketoacidosis which resolved with IV fluids and insulin drip.  He was also noted to have signs of peripheral neuropathy and started on gabapentin.  After getting home patient reports that he was doing okay and has been taking medications as advised.  Symptoms started after he had been outside raking the yard a couple days prior to his presentation.  Initially thought that he may have pulled a muscle.  When he went to stand up had a sharp pain in the lower part of his back that radiated down his left leg.  Noted associated symptoms of some numbness down the left leg as well.    Work-up revealed sepsis secondary to E. coli bacteremia with no clear source of infection.  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  He had a completed 2D echo done on 09/24/2020 which showed LVEF 70 to 75%, moderate concentric left ventricular hypertrophy, moderate calcification of the aortic valve, moderate thickening of the aortic valve, since prior study, the aortic stenosis is now severe.  Reports prior history of OSA, did not follow through.  Does not have a CPAP machine at home.  Will need to follow up with pulmonary.  Last pco2 was 46 on 3/1, will repeat.   09/29/20: Seen and examined  at bedside.  Mainly reports lower back pain, which is persistent.  Noted to have a left knee effusion today for which orthopedic surgery was consulted for arthrocentesis.  Joint fluid will be sent for analysis and culture.  Assessment/Plan: Principal Problem:   Sepsis ([place]) Active Problems:   Type 2 diabetes mellitus with other specified complication (HCC)   AKI (acute kidney injury) ([place])   Bacterial infection due to E. coli   Bacteremia due to Escherichia coli   Nonrheumatic aortic valve stenosis  Sepsis secondary to E. coli bacteremia, unclear source.   Presented with complaints of lower back pain  On presentation febrile with T-max 103.9, tachycardia and tachypnea  Blood cultures positive for E. coli x2/2 bottles.  Resistant to ampicillin and Bactrim.  Intermediate sensitivity for ampicillin/sulbactam. Repeat blood cultures taken on 09/25/2020 - to date. Febrile overnight with T-max of 101.  Infectious disease consulted. Received Rocephin 2 g daily from 09/23/20, stopped on 09/28/2020 and switched to Ancef 2 g IV every 8h as recommended by infectious disease. Continue to monitor fever curve and WBC. Procalcitonin continues to downtrend 17 from 59  Leukocytosis has resolved. Due to concern for endocarditis ID recommended TEE TEE did not reveale any evidence of vegetation or infective endocarditis.  Left knee effusion, unclear etiology. Orthopedic surgery consulted, plan for arthrocentesis Fluid will be sent for analysis and culture, follow results.  Acute on chronic hypercarbic respiratory failure likely secondary to OSA with noncompliance with CPAP VBG done on 09/28/2020 showed pH 7.298 and PCO2 of 54. Counseled  on the importance of wearing a BiPAP at night, he stated he will try to use it as recommended.  Elevated inflammatory markers in the setting of bacteremia Sed rate is uptrending 113 from 46. CRP 26 from 45.  Severe aortic stenosis seen on 2D echo done on 09/24/2020 2D echo  done on 09/24/2020 showed LVEF 70 to 75% with moderate thickening and calcification of the aortic valve.  Now severe aortic stenosis. Cardiology consulted to assist with the management Patient denies history of dizziness or passing out. Appears to be asymptomatic Cardiology contacted for TEE planning to rule out endocarditis however could potentially further assess his aortic stenosis.  Hyperlipidemia Total cholesterol and LDL undetected Continue Lipitor 40 mg daily as recommended by cardiology.  Untreated OSA, not on CPAP Patient reports prior evaluation for OSA, did not follow through Advised to follow-up with pulmonology posthospitalization Patient is receptive. Obtain VBG to assess pH and PCO2.  Resolved AKI Creatinine at baseline 0.9 with GFR greater than 60 Presented with creatinine of 1.4 with GFR 58. He is currently back to his baseline creatinine 0.9 with GFR greater than 60. He has received IV fluid, stopped on 09/28/2020. Encourage oral intake, avoid dehydration. Continue to avoid nephrotoxic agents. Monitor urine output.  Improving acute thrombocytopenia, suspect secondary to acute illness Platelet count uptrending 130k from 95K from 89K from 121K No evidence of bleeding.  Chronic microcytic anemia Appears to be at his baseline hemoglobin 11.4 with MCV 73. Iron studies suggestive of marked iron deficiency. Hold off on iron supplement due to bacteremia Will discharge on iron supplement, once no longer febrile for at least 48 hours. Continue to monitor H&H   Type 2 diabetes with hyperglycemia Hemoglobin A1c 13.1 on 09/15/2020 He is currently on 70/30 insulin 30 units twice daily, insulin sliding scale.   Continue to monitor CBGs  Essential hypertension His BPs had been soft now hypertensive suspect exacerbated by pain Continue metoprolol tartrate 50 mg twice daily. Control pain Continue to monitor vital signs.  Resolved sinus tachycardia TSH 1.289. Continue home  metoprolol tartrate 50 mg twice daily. IV Lopressor as needed with parameters.  Resolved hypovolemic hyponatremia  Presented with serum sodium 129 Serum sodium 135. Encourage oral intake.   Improving elevated liver chemistries LFTs are downtrending. Continue to avoid hepatotoxic agents. Continue to monitor. CT abdomen and pelvis done on 09/24/2020 was benign.  Chronic lower back pain No acute bony abnormality on CT abdomen and pelvis with contrast done on 09/24/2020. MRI lumbar spine and pelvic spine completed, findings listed above. Continue analgesics.  Robaxin as needed added for muscle spasm.  Physical debility/generalized weakness suspect in the setting of acute illness and sepsis. Non focal Assessed by PT recommendation for home health PT    DVT prophylaxis: Lovenox subcu daily Code Status: Full Family Communication:  Called his sister with his permission [phone], Posey Pronto.  No answer, left a voicemail message. Disposition Plan: Hopefully discharge home once medically stable, cardiology infectious disease and orthopedic surgery sign off.  Consults called:   Cardiology. Infectious disease. Orthopedic surgery   Procedures:  2D echo on 09/24/2020.  Antimicrobials:  Rocephin, stopped on 09/28/2020.  Ancef started on 09/28/2020.  DVT prophylaxis: Subcu Lovenox daily.  Status is: Inpatient   Dispo: The patient is from: Home.              Anticipated d/c is to: Home.               Patient currently not stable for discharge  due to ongoing treatment of sepsis secondary to E. coli bacteremia.   Difficult to place patient: Not applicable.        Objective: Vitals:   09/29/20 0738 09/29/20 0912 09/29/20 0919 09/29/20 1146  BP: (!) 145/96 121/69 122/81 (!) 120/97  Pulse: 94  92 (!) 103  Resp: 18 (!) 25 (!) 24 20  Temp: 98.1 F (36.7 C) 97.9 F (36.6 C)  99.6 F (37.6 C)  TempSrc: Temporal Temporal  Oral  SpO2: 100% 99% 96% 95%  Weight: 129.3 kg      Height: 6' (1.829 m)       Intake/Output Summary (Last 24 hours) at 09/29/2020 1421 Last data filed at 09/29/2020 0912 Gross per 24 hour  Intake 740 ml  Output 2650 ml  Net -1910 ml   Filed Weights   09/25/20 1400 09/29/20 0738  Weight: 131.5 kg 129.3 kg    Exam:  . General: 55 y.o. year-old male obese in no acute stress.  He is alert and oriented x3. . Cardiovascular: Regular rate and rhythm no rubs or gallops. Marland Kitchen Respiratory: Clear to auscultation no wheezes no rales.   . Abdomen: Obese nontender bowel sounds present.   . Musculoskeletal: Trace lower extremity edema bilaterally.  .  Skin: Dry skin throughout. Marland Kitchen Psychiatry: Mood is appropriate for condition and setting.  Data Reviewed: CBC: Recent Labs  Lab 09/23/20 1940 09/23/20 1956 09/24/20 2059 09/25/20 0053 09/26/20 0200 09/26/20 1646 09/27/20 0121 09/28/20 0654 09/29/20 0048  WBC 9.7  --  8.8   < > 10.3 9.7 9.0 8.0 8.0  NEUTROABS 8.4*  --  7.8*  --   --   --  6.6 5.2 4.3  HGB 14.5   < > 12.1*   < > 11.4* 10.8* 10.9* 10.1* 9.9*  HCT 41.4   < > 34.9*   < > 32.7* 31.3* 31.3* 27.9* 28.7*  MCV 74.9*  --  74.3*   < > 73.8* 73.6* 73.1* 71.7* 73.2*  PLT 212  --  146*   < > 89* 87* 99* 130* 193   < > = values in this interval not displayed.   Basic Metabolic Panel: Recent Labs  Lab 09/25/20 0053 09/26/20 0200 09/27/20 0121 09/28/20 0654 09/29/20 0048  NA 131* 134* 135 135 135  K 3.7 4.2 3.8 3.6 3.7  CL 102 105 105 105 103  CO2 21* 23 24 24 26   GLUCOSE 219* 149* 195* 162* 108*  BUN 17 18 20 15 12   CREATININE 1.43* 1.67* 1.28* 0.93 0.92  CALCIUM 7.9* 7.9* 8.2* 8.2* 8.5*  MG  --   --   --  1.8  --    GFR: Estimated Creatinine Clearance: 126.1 mL/min (by C-G formula based on SCr of 0.92 mg/dL). Liver Function Tests: Recent Labs  Lab 09/23/20 2319 09/25/20 0053 09/26/20 0200 09/27/20 0121 09/29/20 0048  AST 50* 77* 69* 44* 39  ALT 36 43 42 39 43  ALKPHOS 63 75 134* 100 76  BILITOT 0.7 0.6 0.6 0.4  0.5  PROT 6.5 5.8* 6.1* 5.7* 5.8*  ALBUMIN 2.7* 2.4* 2.1* 1.9* 1.9*   No results for input(s): LIPASE, AMYLASE in the last 168 hours. No results for input(s): AMMONIA in the last 168 hours. Coagulation Profile: No results for input(s): INR, PROTIME in the last 168 hours. Cardiac Enzymes: No results for input(s): CKTOTAL, CKMB, CKMBINDEX, TROPONINI in the last 168 hours. BNP (last 3 results) No results for input(s): PROBNP in the last 8760 hours. HbA1C:  No results for input(s): HGBA1C in the last 72 hours. CBG: Recent Labs  Lab 09/28/20 1120 09/28/20 1625 09/28/20 2128 09/29/20 0606 09/29/20 1109  GLUCAP 213* 147* 116* 122* 102*   Lipid Profile: Recent Labs    09/27/20 0121  CHOL 134  HDL <10*  LDLCALC NOT CALCULATED  TRIG 258*  CHOLHDL NOT CALCULATED   Thyroid Function Tests: No results for input(s): TSH, T4TOTAL, FREET4, T3FREE, THYROIDAB in the last 72 hours. Anemia Panel: Recent Labs    09/26/20 1646  VITAMINB12 978*  FOLATE 11.2  FERRITIN 1,466*  TIBC 174*  IRON 12*   Urine analysis:    Component Value Date/Time   COLORURINE AMBER (A) 09/24/2020 0223   APPEARANCEUR HAZY (A) 09/24/2020 0223   LABSPEC 1.021 09/24/2020 0223   PHURINE 5.0 09/24/2020 0223   GLUCOSEU >=500 (A) 09/24/2020 0223   HGBUR MODERATE (A) 09/24/2020 0223   BILIRUBINUR NEGATIVE 09/24/2020 0223   BILIRUBINUR neg 10/07/2019 0951   KETONESUR 20 (A) 09/24/2020 0223   PROTEINUR 30 (A) 09/24/2020 0223   UROBILINOGEN 0.2 09/15/2020 1032   NITRITE NEGATIVE 09/24/2020 0223   LEUKOCYTESUR NEGATIVE 09/24/2020 0223   Sepsis Labs: @LABRCNTIP (procalcitonin:4,lacticidven:4)  ) Recent Results (from the past 240 hour(s))  Blood culture (routine x 2)     Status: Abnormal   Collection Time: 09/23/20  9:23 PM   Specimen: BLOOD RIGHT HAND  Result Value Ref Range Status   Specimen Description BLOOD RIGHT HAND  Final   Special Requests   Final    BOTTLES DRAWN AEROBIC AND ANAEROBIC Blood  Culture adequate volume   Culture  Setup Time   Final    GRAM NEGATIVE RODS IN BOTH AEROBIC AND ANAEROBIC BOTTLES CRITICAL VALUE NOTED.  VALUE IS CONSISTENT WITH PREVIOUSLY REPORTED AND CALLED VALUE.    Culture (A)  Final    ESCHERICHIA COLI SUSCEPTIBILITIES PERFORMED ON PREVIOUS CULTURE WITHIN THE LAST 5 DAYS. Performed at [facility], [place] [address] [place], [place] [address]    Report Status 09/27/2020 FINAL  Final  Blood culture (routine x 2)     Status: Abnormal   Collection Time: 09/23/20  9:23 PM   Specimen: BLOOD  Result Value Ref Range Status   Specimen Description BLOOD RIGHT ANTECUBITAL  Final   Special Requests   Final    BOTTLES DRAWN AEROBIC AND ANAEROBIC Blood Culture results may not be optimal due to an excessive volume of blood received in culture bottles   Culture  Setup Time   Final    GRAM NEGATIVE RODS IN BOTH AEROBIC AND ANAEROBIC BOTTLES CRITICAL RESULT CALLED TO, READ BACK BY AND VERIFIED WITH: Sharen Heck PharmD 11:10 09/24/20 ([email or web address]) Performed at [facility], [place] [address], [place], [place] [address]    Culture ESCHERICHIA COLI (A)  Final   Report Status 09/27/2020 FINAL  Final   Organism ID, Bacteria ESCHERICHIA COLI  Final      Susceptibility   Escherichia coli - MIC*    AMPICILLIN >=32 RESISTANT Resistant     CEFAZOLIN <=4 SENSITIVE Sensitive     CEFEPIME <=0.12 SENSITIVE Sensitive     CEFTAZIDIME <=1 SENSITIVE Sensitive     CEFTRIAXONE <=0.25 SENSITIVE Sensitive     CIPROFLOXACIN <=0.25 SENSITIVE Sensitive     GENTAMICIN <=1 SENSITIVE Sensitive     IMIPENEM <=0.25 SENSITIVE Sensitive     TRIMETH/SULFA >=320 RESISTANT Resistant     AMPICILLIN/SULBACTAM 16 INTERMEDIATE Intermediate     PIP/TAZO <=4 SENSITIVE Sensitive     *  ESCHERICHIA COLI  Blood Culture ID Panel (Reflexed)     Status: Abnormal   Collection Time: 09/23/20  9:23 PM  Result Value Ref Range Status   Enterococcus faecalis NOT DETECTED NOT DETECTED Final    Enterococcus Faecium NOT DETECTED NOT DETECTED Final   Listeria monocytogenes NOT DETECTED NOT DETECTED Final   Staphylococcus species NOT DETECTED NOT DETECTED Final   Staphylococcus aureus (BCID) NOT DETECTED NOT DETECTED Final   Staphylococcus epidermidis NOT DETECTED NOT DETECTED Final   Staphylococcus lugdunensis NOT DETECTED NOT DETECTED Final   Streptococcus species NOT DETECTED NOT DETECTED Final   Streptococcus agalactiae NOT DETECTED NOT DETECTED Final   Streptococcus pneumoniae NOT DETECTED NOT DETECTED Final   Streptococcus pyogenes NOT DETECTED NOT DETECTED Final   A.calcoaceticus-baumannii NOT DETECTED NOT DETECTED Final   Bacteroides fragilis NOT DETECTED NOT DETECTED Final   Enterobacterales DETECTED (A) NOT DETECTED Final    Comment: Enterobacterales represent a large order of gram negative bacteria, not a single organism. CRITICAL RESULT CALLED TO, READ BACK BY AND VERIFIED WITH: Sharen Heck PharmD 11:10 09/24/20 ([email or web address])    Enterobacter cloacae complex NOT DETECTED NOT DETECTED Final   Escherichia coli DETECTED (A) NOT DETECTED Final   Klebsiella aerogenes NOT DETECTED NOT DETECTED Final   Klebsiella oxytoca NOT DETECTED NOT DETECTED Final    Comment: CRITICAL RESULT CALLED TO, READ BACK BY AND VERIFIED WITH: Sharen Heck PharmD 11:15 09/24/20 ([email or web address])    Klebsiella pneumoniae NOT DETECTED NOT DETECTED Final   Proteus species NOT DETECTED NOT DETECTED Final   Salmonella species NOT DETECTED NOT DETECTED Final   Serratia marcescens NOT DETECTED NOT DETECTED Final   Haemophilus influenzae NOT DETECTED NOT DETECTED Final   Neisseria meningitidis NOT DETECTED NOT DETECTED Final   Pseudomonas aeruginosa NOT DETECTED NOT DETECTED Final   Stenotrophomonas maltophilia NOT DETECTED NOT DETECTED Final   Candida albicans NOT DETECTED NOT DETECTED Final   Candida auris NOT DETECTED NOT DETECTED Final   Candida glabrata NOT DETECTED NOT DETECTED Final   Candida krusei NOT  DETECTED NOT DETECTED Final   Candida parapsilosis NOT DETECTED NOT DETECTED Final   Candida tropicalis NOT DETECTED NOT DETECTED Final   Cryptococcus neoformans/gattii NOT DETECTED NOT DETECTED Final   CTX-M ESBL NOT DETECTED NOT DETECTED Final   Carbapenem resistance IMP NOT DETECTED NOT DETECTED Final   Carbapenem resistance KPC NOT DETECTED NOT DETECTED Final   Carbapenem resistance NDM NOT DETECTED NOT DETECTED Final   Carbapenem resist OXA 48 LIKE NOT DETECTED NOT DETECTED Final   Carbapenem resistance VIM NOT DETECTED NOT DETECTED Final    Comment: Performed at [facility], [address], [place], [place] [address]  Resp Panel by RT-PCR (Flu A&B, Covid) Nasopharyngeal Swab     Status: None   Collection Time: 09/23/20 10:00 PM   Specimen: Nasopharyngeal Swab; Nasopharyngeal(NP) swabs in vial transport medium  Result Value Ref Range Status   SARS Coronavirus 2 by RT PCR NEGATIVE NEGATIVE Final    Comment: (NOTE) SARS-CoV-2 target nucleic acids are NOT DETECTED.  The SARS-CoV-2 RNA is generally detectable in upper respiratory specimens during the acute phase of infection. The lowest concentration of SARS-CoV-2 viral copies this assay can detect is 138 copies/mL. A negative result does not preclude SARS-Cov-2 infection and should not be used as the sole basis for treatment or other patient management decisions. A negative result may occur with  improper specimen collection/handling, submission of specimen other than nasopharyngeal swab, presence of viral mutation(s) within  the areas targeted by this assay, and inadequate number of viral copies(<138 copies/mL). A negative result must be combined with clinical observations, patient history, and epidemiological information. The expected result is Negative.  Fact Sheet for Patients:  EntrepreneurPulse.com.au  Fact Sheet for Healthcare Providers:  IncredibleEmployment.be  This test is no  t yet approved or cleared by the [place] FDA and  has been authorized for detection and/or diagnosis of SARS-CoV-2 by FDA under an Emergency Use Authorization (EUA). This EUA will remain  in effect (meaning this test can be used) for the duration of the COVID-19 declaration under Section 564(b)(1) of the Act, 21 U.S.C.section 360bbb-3(b)(1), unless the authorization is terminated  or revoked sooner.       Influenza A by PCR NEGATIVE NEGATIVE Final   Influenza B by PCR NEGATIVE NEGATIVE Final    Comment: (NOTE) The Xpert Xpress SARS-CoV-2/FLU/RSV plus assay is intended as an aid in the diagnosis of influenza from Nasopharyngeal swab specimens and should not be used as a sole basis for treatment. Nasal washings and aspirates are unacceptable for Xpert Xpress SARS-CoV-2/FLU/RSV testing.  Fact Sheet for Patients: EntrepreneurPulse.com.au  Fact Sheet for Healthcare Providers: IncredibleEmployment.be  This test is not yet approved or cleared by the [place] FDA and has been authorized for detection and/or diagnosis of SARS-CoV-2 by FDA under an Emergency Use Authorization (EUA). This EUA will remain in effect (meaning this test can be used) for the duration of the COVID-19 declaration under Section 564(b)(1) of the Act, 21 U.S.C. section 360bbb-3(b)(1), unless the authorization is terminated or revoked.  Performed at [facility], [place] [address], [place], [place] [address]   Culture, blood (routine x 2)     Status: None (Preliminary result)   Collection Time: 09/25/20  9:29 AM   Specimen: BLOOD  Result Value Ref Range Status   Specimen Description BLOOD LEFT ANTECUBITAL  Final   Special Requests   Final    BOTTLES DRAWN AEROBIC AND ANAEROBIC Blood Culture results may not be optimal due to an inadequate volume of blood received in culture bottles   Culture   Final    NO GROWTH 4 DAYS Performed at [facility], [place] [address] [place], [place] [address]    Report Status PENDING  Incomplete  Culture, blood (routine x 2)     Status: None (Preliminary result)   Collection Time: 09/25/20  9:38 AM   Specimen: BLOOD  Result Value Ref Range Status   Specimen Description BLOOD LEFT ANTECUBITAL  Final   Special Requests   Final    BOTTLES DRAWN AEROBIC ONLY Blood Culture adequate volume   Culture   Final    NO GROWTH 4 DAYS Performed at [facility], [place] [address] [place], [place] [address]    Report Status PENDING  Incomplete  MRSA PCR Screening     Status: None   Collection Time: 09/26/20  8:00 AM   Specimen: Nasal Mucosa; Nasopharyngeal  Result Value Ref Range Status   MRSA by PCR NEGATIVE NEGATIVE Final    Comment:        The GeneXpert MRSA Assay (FDA approved for NASAL specimens only), is one component of a comprehensive MRSA colonization surveillance program. It is not intended to diagnose MRSA infection nor to guide or monitor treatment for MRSA infections. Performed at [facility], [place] [address], [place], [place] [address]   Culture, blood (Routine X 2) w Reflex to ID Panel  Status: None (Preliminary result)   Collection Time: 09/28/20 12:13 PM   Specimen: BLOOD LEFT HAND  Result Value Ref Range Status   Specimen Description BLOOD LEFT HAND  Final   Special Requests   Final    BOTTLES DRAWN AEROBIC AND ANAEROBIC Blood Culture adequate volume   Culture   Final    NO GROWTH < 24 HOURS Performed at [facility], [address], [place], [place] [address]    Report Status PENDING  Incomplete  Culture, blood (Routine X 2) w Reflex to ID Panel     Status: None (Preliminary result)   Collection Time: 09/28/20 12:13 PM   Specimen: BLOOD LEFT WRIST  Result Value Ref Range Status   Specimen Description BLOOD LEFT WRIST  Final   Special Requests   Final    BOTTLES DRAWN AEROBIC AND ANAEROBIC Blood Culture adequate volume   Culture   Final    NO GROWTH < 24  HOURS Performed at [facility], [place] [address], [place], [place] [address]    Report Status PENDING  Incomplete      Studies: ECHO TEE  Result Date: 09/29/2020    TRANSESOPHOGEAL ECHO REPORT   Patient Name:   HAROON SHATTO Date of Exam: 09/29/2020 Medical Rec #:  [id number]         Height:       72.0 in Accession #:    [id number]        Weight:       285.0 lb Date of Birth:  10/12/1965          BSA:          2.477 m Patient Age:    6 years          BP:           121/69 mmHg Patient Gender: M                 HR:           97 bpm. Exam Location:  Inpatient Procedure: Transesophageal Echo, Color Doppler, Cardiac Doppler and 3D Echo Indications:     Bacteremia  History:         Patient has prior history of Echocardiogram examinations, most                  recent 09/24/2020. Aortic Valve Disease; Risk                  Factors:Hypertension, Diabetes and Dyslipidemia.  Sonographer:     Mikki Santee RDCS (AE) Referring Phys:  [place] Diagnosing Phys: Cherlynn Kaiser MD PROCEDURE: After discussion of the risks and benefits of a TEE, an informed consent was obtained from the patient. TEE procedure time was 24 minutes. The transesophogeal probe was passed without difficulty through the esophogus of the patient. Imaged were obtained with the patient in a left lateral decubitus position. Local oropharyngeal anesthetic was provided with Cetacaine. Sedation performed by different physician. The patient was monitored while under deep sedation. Anesthestetic sedation was provided intravenously by Anesthesiology: 587.2mg  of Propofol, 100mg  of Lidocaine. Image quality was good. The patient's vital signs; including heart rate, blood pressure, and oxygen saturation; remained stable throughout the procedure. The patient developed no complications during the procedure. IMPRESSIONS  1. The aortic valve is tricuspid. There is severe calcifcation of the aortic valve. Aortic valve regurgitation is  trivial. Severe aortic valve stenosis. Aortic valve area, by VTI measures 0.91 cm.  Aortic valve mean gradient measures 50.0 mmHg. Aortic valve Vmax measures 4.40 m/s.  2. Left ventricular ejection fraction, by estimation, is 65 to 70%. The left ventricle has normal function. There is moderate concentric left ventricular hypertrophy.  3. Right ventricular systolic function is normal. The right ventricular size is normal.  4. Left atrial size was mildly dilated. 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 The LAA emptying velocity was 100 cm/s.  5. The mitral valve is normal in structure. Trivial mitral valve regurgitation. No evidence of mitral stenosis. Conclusion(s)/Recommendation(s): No evidence of vegetation/infective endocarditis on this transesophageal echocardiogram. FINDINGS  Left Ventricle: Left ventricular ejection fraction, by estimation, is 65 to 70%. The left ventricle has normal function. The left ventricular internal cavity size was normal in size. There is moderate concentric left ventricular hypertrophy. Right Ventricle: The right ventricular size is normal. No increase in right ventricular wall thickness. Right ventricular systolic function is normal. Left Atrium: Left atrial size was mildly dilated. 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 The LAA emptying velocity was 100 cm/s. Right Atrium: Right atrial size was normal in size. Pericardium: Trivial pericardial effusion is present. Mitral Valve: The mitral valve is normal in structure. Trivial mitral valve regurgitation. No evidence of mitral valve stenosis. Tricuspid Valve: The tricuspid valve is normal in structure. Tricuspid valve regurgitation is trivial. There is no evidence of tricuspid valve vegetation. Aortic Valve: The aortic valve is tricuspid. There is severe calcifcation of the aortic valve. Aortic valve regurgitation is trivial. Severe aortic stenosis is present. Aortic valve mean gradient measures  50.0 mmHg. Aortic valve peak gradient measures 77.4 mmHg. Aortic valve area, by VTI measures 0.91 cm. Pulmonic Valve: The pulmonic valve was normal in structure. Pulmonic valve regurgitation is trivial. Aorta: The aortic root and ascending aorta are structurally normal, with no evidence of dilitation. There is minimal (Grade I) plaque. IAS/Shunts: No atrial level shunt detected by color flow Doppler. Agitated saline contrast was given intravenously to evaluate for intracardiac shunting.  LEFT VENTRICLE PLAX 2D LVOT diam:     2.30 cm LV SV:         76 LV SV Index:   31 LVOT Area:     4.15 cm  AORTIC VALVE AV Area (Vmax):    0.95 cm AV Area (Vmean):   0.88 cm AV Area (VTI):     0.91 cm AV Vmax:           440.00 cm/s AV Vmean:          333.000 cm/s AV VTI:            0.841 m AV Peak Grad:      77.4 mmHg AV Mean Grad:      50.0 mmHg LVOT Vmax:         101.00 cm/s LVOT Vmean:        70.600 cm/s LVOT VTI:          0.184 m LVOT/AV VTI ratio: 0.22  AORTA Ao Root diam: 3.40 cm Ao Asc diam:  3.50 cm  SHUNTS Systemic VTI:  0.18 m Systemic Diam: 2.30 cm Cherlynn Kaiser MD Electronically signed by Cherlynn Kaiser MD Signature Date/Time: 09/29/2020/2:12:36 PM    Final     Scheduled Meds: . aspirin EC  81 mg Oral Daily  . atorvastatin  40 mg Oral Daily  . bupivacaine  10 mL Infiltration Once  . enoxaparin (LOVENOX) injection  65 mg Subcutaneous Daily  . insulin aspart  0-20 Units Subcutaneous  TID WC  . insulin aspart protamine- aspart  30 Units Subcutaneous BID WC  . lidocaine  1 patch Transdermal Daily  . methylPREDNISolone acetate  80 mg Intra-articular Once  . metoprolol tartrate  50 mg Oral BID  . sodium chloride flush  3 mL Intravenous Q12H    Continuous Infusions: .  ceFAZolin (ANCEF) IV 2 g (09/29/20 1042)     LOS: 5 days     Kayleen Memos, MD [facility] Pager [phone]  If 7PM-7AM, please contact night-coverage [email or web address] Password [email or web address] 09/29/2020, 2:21 PM

## 2020-09-29 NOTE — Interval H&P Note (Signed)
History and Physical Interval Note:  09/29/2020 8:19 AM  Riley Hood  has presented today for surgery, with the diagnosis of BACTEREMIA, Aortic valve stenosis.  The various methods of treatment have been discussed with the patient and family. After consideration of risks, benefits and other options for treatment, the patient has consented to  Procedure(s): TRANSESOPHAGEAL ECHOCARDIOGRAM (TEE) (N/A) as a surgical intervention.  The patient's history has been reviewed, patient examined, no change in status, stable for surgery.  I have reviewed the patient's chart and labs.  Questions were answered to the patient's satisfaction.     [name]

## 2020-09-29 NOTE — TOC Progression Note (Signed)
Transition of Care [facility]) - Progression Note    Patient Details  Name: Riley Hood MRN: [id number] Date of Birth: 08-10-1965  Transition of Care [facility]) CM/SW Contact  Azahel Belcastro, [name], RN Phone Number: 09/29/2020, 3:56 PM  Clinical Narrative:     [name] with [facility] accepted referral for HHPT, there will be delay in start of care 7 days due to staffing       Expected Discharge Plan and Services   In-house Referral: Financial Counselor Discharge Planning Services: CM Consult Post Acute Care Choice: [place] arrangements for the past 2 months: Single Family Home                                       Social Determinants of Health (SDOH) Interventions    Readmission Risk Interventions No flowsheet data found.

## 2020-09-29 NOTE — Transfer of Care (Signed)
Immediate Anesthesia Transfer of Care Note  Patient: Riley Hood  Procedure(s) Performed: TRANSESOPHAGEAL ECHOCARDIOGRAM (TEE) (N/A ) BUBBLE STUDY  Patient Location: Endoscopy Unit  Anesthesia Type:MAC  Level of Consciousness: awake, alert  and oriented  Airway & Oxygen Therapy: Patient Spontanous Breathing  Post-op Assessment: Report given to RN and Post -op Vital signs reviewed and stable  Post vital signs: Reviewed and stable  Last Vitals:  Vitals Value Taken Time  BP 121/69 09/29/20 0912  Temp    Pulse 92 09/29/20 0912  Resp 27 09/29/20 0912  SpO2 95 % 09/29/20 0912  Vitals shown include unvalidated device data.  Last Pain:  Vitals:   09/29/20 0738  TempSrc: Temporal  PainSc: 8          Complications: No complications documented.

## 2020-09-29 NOTE — TOC Progression Note (Signed)
Transition of Care [facility]) - Progression Note    Patient Details  Name: Riley Hood MRN: [id number] Date of Birth: [birth year]  [facility] CM/SW Contact  Jacalyn Lefevre [name], RN Phone Number: 09/29/2020, 1:48 PM  Clinical Narrative:      Referral for home health PT given to [name] with [facility]. She will review clinicals and speak to patient , and let NCM know determination.      Expected Discharge Plan and Services   In-house Referral: Financial Counselor Discharge Planning Services: CM Consult Post Acute Care Choice: [place] arrangements for the past 2 months: Single Family Home                                       Social Determinants of Health (SDOH) Interventions    Readmission Risk Interventions No flowsheet data found.

## 2020-09-30 ENCOUNTER — Inpatient Hospital Stay: Payer: Self-pay

## 2020-09-30 ENCOUNTER — Encounter (HOSPITAL_COMMUNITY): Payer: Self-pay | Admitting: Internal Medicine

## 2020-09-30 LAB — CBC
HCT: 28.6 % — ABNORMAL LOW (ref 39.0–52.0)
Hemoglobin: 10.2 g/dL — ABNORMAL LOW (ref 13.0–17.0)
MCH: 25.8 pg — ABNORMAL LOW (ref 26.0–34.0)
MCHC: 35.7 g/dL (ref 30.0–36.0)
MCV: 72.4 fL — ABNORMAL LOW (ref 80.0–100.0)
Platelets: 304 10*3/uL (ref 150–400)
RBC: 3.95 MIL/uL — ABNORMAL LOW (ref 4.22–5.81)
RDW: 14.5 % (ref 11.5–15.5)
WBC: 9.7 10*3/uL (ref 4.0–10.5)
nRBC: 0 % (ref 0.0–0.2)

## 2020-09-30 LAB — CULTURE, BLOOD (ROUTINE X 2)
Culture: NO GROWTH
Culture: NO GROWTH
Special Requests: ADEQUATE

## 2020-09-30 LAB — BASIC METABOLIC PANEL
Anion gap: 5 (ref 5–15)
BUN: 11 mg/dL (ref 6–20)
CO2: 27 mmol/L (ref 22–32)
Calcium: 8.8 mg/dL — ABNORMAL LOW (ref 8.9–10.3)
Chloride: 103 mmol/L (ref 98–111)
Creatinine, Ser: 0.82 mg/dL (ref 0.61–1.24)
GFR, Estimated: >60 mL/min (ref >60)
Glucose, Bld: 220 mg/dL — ABNORMAL HIGH (ref 70–99)
Potassium: 4.2 mmol/L (ref 3.5–5.1)
Sodium: 135 mmol/L (ref 135–145)

## 2020-09-30 LAB — GLUCOSE, CAPILLARY
Glucose-Capillary: 186 mg/dL — ABNORMAL HIGH (ref 70–99)
Glucose-Capillary: 188 mg/dL — ABNORMAL HIGH (ref 70–99)
Glucose-Capillary: 141 mg/dL — ABNORMAL HIGH (ref 70–99)
Glucose-Capillary: 200 mg/dL — ABNORMAL HIGH (ref 70–99)

## 2020-09-30 LAB — PROCALCITONIN: Procalcitonin: 1.01 ng/mL

## 2020-09-30 MED ORDER — SENNOSIDES-DOCUSATE SODIUM 8.6-50 MG PO TABS
1.0000 | ORAL_TABLET | Freq: Every day | ORAL | Status: DC
  Administered 2020-09-30: 1 via ORAL
  Filled 2020-09-30 (×2): qty 1

## 2020-09-30 MED ORDER — INSULIN ASPART PROT & ASPART (70-30 MIX) 100 UNIT/ML ~~LOC~~ SUSP
32.0000 [IU] | Freq: Two times a day (BID) | SUBCUTANEOUS | Status: DC
  Administered 2020-09-30 – 2020-10-01 (×2): 32 [IU] via SUBCUTANEOUS

## 2020-09-30 MED ORDER — CHLORHEXIDINE GLUCONATE CLOTH 2 % EX PADS
6.0000 | MEDICATED_PAD | Freq: Every day | CUTANEOUS | Status: DC
  Administered 2020-09-30 – 2020-10-01 (×2): 6 via TOPICAL

## 2020-09-30 MED ORDER — SODIUM CHLORIDE 0.9% FLUSH: 10.0000 mL | INTRAVENOUS | Status: DC | PRN

## 2020-09-30 MED ORDER — FERROUS SULFATE 325 (65 FE) MG PO TABS
325.0000 mg | ORAL_TABLET | Freq: Two times a day (BID) | ORAL | Status: DC
  Administered 2020-09-30 – 2020-10-01 (×2): 325 mg via ORAL
  Filled 2020-09-30 (×2): qty 1

## 2020-09-30 MED ORDER — POLYETHYLENE GLYCOL 3350 17 G PO PACK
17.0000 g | PACK | Freq: Every day | ORAL | Status: DC
  Administered 2020-09-30 – 2020-10-01 (×2): 17 g via ORAL
  Filled 2020-09-30 (×2): qty 1

## 2020-09-30 NOTE — Progress Notes (Signed)
Occupational Therapy Treatment Patient Details Name: Riley Hood MRN: [id number] DOB: 1966/06/07 Today's Date: 09/30/2020    History of present illness 55 y.o. male admitted on 09/23/20 with chief complaint of LBP and diagnosis of sepsis. 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   OT comments  Patient supine in bed and agreeable to OT/PT session. Patient progressing slowly towards OT goals but continues to require max assist for LB dressing, mod assist +2 for toilet transfers.  Patient with decreased insight to level of assistance required, and believe he will best benefit from CIR level rehab to optimize independence and safety prior to dc home.  Will follow acutely.    Follow Up Recommendations  Supervision/Assistance - 24 hour;CIR    Equipment Recommendations  3 in 1 bedside commode;Other (comment) (RW)    Recommendations for Other Services      Precautions / Restrictions Precautions Precautions: Fall Precaution Comments: severe back pain Restrictions Weight Bearing Restrictions: No       Mobility Bed Mobility Overal bed mobility: Needs Assistance Bed Mobility: Rolling;Sidelying to Sit Rolling: Min assist Sidelying to sit: Mod assist;HOB elevated       General bed mobility comments: heavy use of bed rails, cueing for technique and increased time    Transfers Overall transfer level: Needs assistance Equipment used: Rolling walker (2 wheeled) Transfers: Sit to/from Stand Sit to Stand: Mod assist;+2 physical assistance;+2 safety/equipment Stand pivot transfers: Mod assist;+2 physical assistance;+2 safety/equipment       General transfer comment: mod assist +2 to power up and steady into standing, blocking feet with cueing for hand placement, technique    Balance Overall balance assessment: Needs assistance Sitting-balance support: Feet supported;No upper extremity supported Sitting balance-Leahy  Scale: Fair Sitting balance - Comments: min guard for safety   Standing balance support: Bilateral upper extremity supported;During functional activity Standing balance-Leahy Scale: Poor Standing balance comment: relies on BUE and external support                           ADL either performed or assessed with clinical judgement   ADL Overall ADL's : Needs assistance/impaired     Grooming: Set up;Sitting               Lower Body Dressing: Maximal assistance;Sit to/from stand   Toilet Transfer: Maximal assistance;Ambulation;BSC;RW;+2 for physical assistance;+2 for safety/equipment           Functional mobility during ADLs: +2 for physical assistance;+2 for safety/equipment;Moderate assistance General ADL Comments: pt limited by back pain     Vision       Perception     Praxis      Cognition Arousal/Alertness: Awake/alert Behavior During Therapy: Anxious Overall Cognitive Status: Impaired/Different from baseline Area of Impairment: Awareness                           Awareness: Emergent   General Comments: some decreased insight to deficits and need for assistance noted        Exercises     Shoulder Instructions       General Comments reports mulitple falls in the last 6 months (20-30), reports he will have 2 people to assist him as needed but unsure his level of insight as he needs signficant assist at this time    Pertinent Vitals/ Pain       Pain Assessment: Faces Pain Score: 8  Faces Pain Scale: Hurts whole lot Pain Location: back and L knee Pain Descriptors / Indicators: Guarding;Radiating;Heaviness Pain Intervention(s): Monitored during session;Limited activity within patient's tolerance;Repositioned  Home Living                                          Prior Functioning/Environment              Frequency  Min 2X/week        Progress Toward Goals  OT Goals(current goals can now be found  in the care plan section)  Progress towards OT goals: Progressing toward goals  Acute Rehab OT Goals Patient Stated Goal: less pain OT Goal Formulation: With patient  Plan Frequency remains appropriate;Discharge plan needs to be updated    Co-evaluation    PT/OT/SLP Co-Evaluation/Treatment: Yes Reason for Co-Treatment: For patient/therapist safety;To address functional/ADL transfers   OT goals addressed during session: ADL's and self-care      AM-PAC OT "6 Clicks" Daily Activity     Outcome Measure   Help from another person eating meals?: None Help from another person taking care of personal grooming?: A Little Help from another person toileting, which includes using toliet, bedpan, or urinal?: A Lot Help from another person bathing (including washing, rinsing, drying)?: A Lot Help from another person to put on and taking off regular upper body clothing?: A Little Help from another person to put on and taking off regular lower body clothing?: A Lot 6 Click Score: 16    End of Session Equipment Utilized During Treatment: Gait belt;Rolling walker  OT Visit Diagnosis: Pain;Other abnormalities of gait and mobility (R26.89) Pain - part of body:  (back)   Activity Tolerance Patient tolerated treatment well   Patient Left in chair;with call bell/phone within reach   Nurse Communication Mobility status        Time: 1610-9604 OT Time Calculation (min): 27 min  Charges: OT General Charges $OT Visit: 1 Visit OT Treatments $Self Care/Home Management : 8-22 mins  Jolaine Artist, OT [place] Pager [phone] Office [place] 09/30/2020, 3:31 PM

## 2020-09-30 NOTE — Progress Notes (Addendum)
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 place until doctor has seen patient or been notified  Fax weekly labs to [phone]   [name] has an appointment on 10/22/2020 at 1115 with Dr. Tommy Medal  The [facility] for Infectious Disease is located in the [facility] at  [place] in [place].  [address], which is located to the left of the elevators.  Phone: [phone]  Fax: [phone]  [email or web address]  He should arrive 15 minutes prior to his appointment.   LOS: 6 days   Alcide Evener 09/30/2020, 11:23 AM

## 2020-09-30 NOTE — Progress Notes (Addendum)
PHARMACY CONSULT NOTE FOR:  OUTPATIENT  PARENTERAL ANTIBIOTIC THERAPY (OPAT)  Indication: E. Coli bacteremia Regimen: Cefazolin (Ancef) 2g IV q8h End date: 11/05/2020  IV antibiotic discharge orders are pended. To discharging provider:  please sign these orders via discharge navigator,  Select New Orders & click on the button choice - Manage This Unsigned Work.     Thank you for allowing pharmacy to be a part of this patient's care.  Claudina Lick, PharmD PGY1 Acute Care Pharmacy Resident 09/30/2020 10:43 AM  Please check [email or web address] for unit-specific pharmacy phone numbers.

## 2020-09-30 NOTE — Anesthesia Postprocedure Evaluation (Signed)
Anesthesia Post Note  Patient: Riley Hood  Procedure(s) Performed: TRANSESOPHAGEAL ECHOCARDIOGRAM (TEE) (N/A ) BUBBLE STUDY     Patient location during evaluation: PACU Anesthesia Type: MAC Level of consciousness: awake and alert Pain management: pain level controlled Vital Signs Assessment: post-procedure vital signs reviewed and stable Respiratory status: spontaneous breathing Cardiovascular status: stable Anesthetic complications: no   No complications documented.  Last Vitals:  Vitals:   09/29/20 2331 09/30/20 0454  BP: (!) 143/101 (!) 135/98  Pulse: 88 87  Resp: 20 18  Temp: 37.4 C 37 C  SpO2: 95% 94%    Last Pain:  Vitals:   09/30/20 0740  TempSrc:   PainSc: 0-No pain                 Nolon Nations
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
## 2020-09-30 NOTE — TOC Progression Note (Addendum)
Transition of Care [facility]) - Progression Note    Patient Details  Name: Riley Hood MRN: [id number] Date of Birth: August 12, 1965  Transition of [facility] CM/SW Contact  Riley Lefevre [name], RN Phone Number: 09/30/2020, 2:15 PM  Clinical Narrative:     Per note patient needs Dr. Marland Kitchenplacing PICC line-6 weeks Ancef.   PT recommending CIR, place consult and secure chatted admissions coordinator.   Will Update [name] with [facility]  And call [name] with [facility].   [name] with [facility] will check with her office to see if they can service patient with [facility] if CIR does not work out.   [name] with [facility] called back. She spoke to her office manager and they can no longer accept referral.   [name] with [name] will also follow.   Explained above to patient.   If patient not accepted to CIR. NCM will arrange home IV ABX. Explained [name] with [name] will provide teaching to him and a support person. HHRN will not be present everytime a dose is due. Patient lives with son [name] who can assist. Also patient's son [name] can assist. NCM asked if NCM could call sons and explain above. Patient declined stating MD has called already.     Expected Discharge Plan and Services   In-house Referral: Financial Counselor Discharge Planning Services: CM Consult Post Acute Care Choice: [place] arrangements for the past 2 months: Single Family Home                                       Social Determinants of Health (SDOH) Interventions    Readmission Risk Interventions No flowsheet data found.
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
## 2020-09-30 NOTE — Progress Notes (Signed)
Peripherally Inserted Central Catheter Placement  The IV Nurse has discussed with the patient and/or persons authorized to consent for the patient, the purpose of this procedure and the potential benefits and risks involved with this procedure.  The benefits include less needle sticks, lab draws from the catheter, and the patient may be discharged home with the catheter. Risks include, but not limited to, infection, bleeding, blood clot (thrombus formation), and puncture of an artery; nerve damage and irregular heartbeat and possibility to perform a PICC exchange if needed/ordered by physician.  Alternatives to this procedure were also discussed.  Bard Power PICC patient education guide, fact sheet on infection prevention and patient information card has been provided to patient /or left at bedside.    PICC Placement Documentation  PICC Single Lumen 09/30/20 PICC Right Cephalic 40 cm 0 cm (Active)  Indication for Insertion or Continuance of Line Home intravenous therapies (PICC only) 09/30/20 1845  Exposed Catheter (cm) 0 cm 09/30/20 1845  Site Assessment Clean;Dry;Intact 09/30/20 1845  Line Status Flushed;Blood return noted;Saline locked 09/30/20 1845  Dressing Type Transparent 09/30/20 1845  Dressing Status Dry;Intact;Clean 09/30/20 1845  Antimicrobial disc in place? Yes 09/30/20 1845  Dressing Change Due 10/07/20 09/30/20 1845       Scotty Court 09/30/2020, 6:47 PM

## 2020-10-01 ENCOUNTER — Inpatient Hospital Stay (HOSPITAL_COMMUNITY): Payer: Self-pay

## 2020-10-01 ENCOUNTER — Other Ambulatory Visit (HOSPITAL_COMMUNITY): Payer: Self-pay | Admitting: Family Medicine

## 2020-10-01 LAB — COMPREHENSIVE METABOLIC PANEL
ALT: 42 U/L (ref 0–44)
AST: 38 U/L (ref 15–41)
Albumin: 1.9 g/dL — ABNORMAL LOW (ref 3.5–5.0)
Alkaline Phosphatase: 75 U/L (ref 38–126)
Anion gap: 7 (ref 5–15)
BUN: 13 mg/dL (ref 6–20)
CO2: 28 mmol/L (ref 22–32)
Calcium: 9 mg/dL (ref 8.9–10.3)
Chloride: 102 mmol/L (ref 98–111)
Creatinine, Ser: 0.87 mg/dL (ref 0.61–1.24)
GFR, Estimated: >60 mL/min (ref >60)
Glucose, Bld: 183 mg/dL — ABNORMAL HIGH (ref 70–99)
Potassium: 4.3 mmol/L (ref 3.5–5.1)
Sodium: 137 mmol/L (ref 135–145)
Total Bilirubin: 0.6 mg/dL (ref 0.3–1.2)
Total Protein: 6.8 g/dL (ref 6.5–8.1)

## 2020-10-01 LAB — CBC WITH DIFFERENTIAL/PLATELET
Abs Immature Granulocytes: 0.17 10*3/uL — ABNORMAL HIGH (ref 0.00–0.07)
Basophils Absolute: 0 10*3/uL (ref 0.0–0.1)
Basophils Relative: 0 %
Eosinophils Absolute: 0 10*3/uL (ref 0.0–0.5)
Eosinophils Relative: 0 %
HCT: 31.8 % — ABNORMAL LOW (ref 39.0–52.0)
Hemoglobin: 10.8 g/dL — ABNORMAL LOW (ref 13.0–17.0)
Immature Granulocytes: 2 %
Lymphocytes Relative: 21 %
Lymphs Abs: 2.1 10*3/uL (ref 0.7–4.0)
MCH: 25.1 pg — ABNORMAL LOW (ref 26.0–34.0)
MCHC: 34 g/dL (ref 30.0–36.0)
MCV: 74 fL — ABNORMAL LOW (ref 80.0–100.0)
Monocytes Absolute: 1 10*3/uL (ref 0.1–1.0)
Monocytes Relative: 10 %
Neutro Abs: 7 10*3/uL (ref 1.7–7.7)
Neutrophils Relative %: 67 %
Platelets: 430 10*3/uL — ABNORMAL HIGH (ref 150–400)
RBC: 4.3 MIL/uL (ref 4.22–5.81)
RDW: 14.5 % (ref 11.5–15.5)
WBC: 10.4 10*3/uL (ref 4.0–10.5)
nRBC: 0 % (ref 0.0–0.2)

## 2020-10-01 LAB — GLUCOSE, CAPILLARY
Glucose-Capillary: 157 mg/dL — ABNORMAL HIGH (ref 70–99)
Glucose-Capillary: 148 mg/dL — ABNORMAL HIGH (ref 70–99)

## 2020-10-01 LAB — MAGNESIUM: Magnesium: 1.6 mg/dL — ABNORMAL LOW (ref 1.7–2.4)

## 2020-10-01 MED ORDER — HEPARIN SOD (PORK) LOCK FLUSH 100 UNIT/ML IV SOLN
250.0000 [IU] | Freq: Every day | INTRAVENOUS | Status: DC
  Administered 2020-10-01: 250 [IU]
  Filled 2020-10-01: qty 2.5

## 2020-10-01 MED ORDER — HYDROCODONE-ACETAMINOPHEN 5-325 MG PO TABS: 1.0000 | ORAL_TABLET | Freq: Four times a day (QID) | ORAL | 0 refills | Status: DC | PRN

## 2020-10-01 MED ORDER — GADOBUTROL 1 MMOL/ML IV SOLN
10.0000 mL | Freq: Once | INTRAVENOUS | Status: AC | PRN
  Administered 2020-10-01: 10 mL via INTRAVENOUS

## 2020-10-01 MED ORDER — CEFAZOLIN IV (FOR PTA / DISCHARGE USE ONLY): 2.0000 g | Freq: Three times a day (TID) | INTRAVENOUS | 0 refills | Status: AC

## 2020-10-01 MED ORDER — FERROUS SULFATE 325 (65 FE) MG PO TABS: 325.0000 mg | ORAL_TABLET | Freq: Two times a day (BID) | ORAL | 3 refills | Status: DC

## 2020-10-01 MED ORDER — METHOCARBAMOL 500 MG PO TABS: 500.0000 mg | ORAL_TABLET | Freq: Three times a day (TID) | ORAL | 0 refills | Status: DC | PRN

## 2020-10-01 MED ORDER — SENNOSIDES-DOCUSATE SODIUM 8.6-50 MG PO TABS: 1.0000 | ORAL_TABLET | Freq: Every day | ORAL | 0 refills | Status: DC

## 2020-10-01 MED ORDER — ATORVASTATIN CALCIUM 40 MG PO TABS: 40.0000 mg | ORAL_TABLET | Freq: Every day | ORAL | 12 refills | Status: DC

## 2020-10-01 MED ORDER — POLYETHYLENE GLYCOL 3350 17 G PO PACK: 17.0000 g | PACK | Freq: Every day | ORAL | 0 refills | Status: DC

## 2020-10-01 MED ORDER — ACETAMINOPHEN 650 MG RE SUPP: 650.0000 mg | Freq: Four times a day (QID) | RECTAL | 0 refills | Status: DC | PRN

## 2020-10-01 MED ORDER — HEPARIN SOD (PORK) LOCK FLUSH 100 UNIT/ML IV SOLN
250.0000 [IU] | INTRAVENOUS | Status: DC | PRN
  Filled 2020-10-01: qty 2.5

## 2020-10-01 NOTE — Progress Notes (Signed)
Riley Hood to be D/C'd Home per MD order.  Discussed with the patient and all questions fully answered.   VSS, Skin clean, dry and intact without evidence of skin break down, no evidence of skin tears noted. IV catheter discontinued intact. Site without signs and symptoms of complications. Dressing and pressure applied.   An After Visit Summary was printed and given to the patient.    D/C education completed with patient/family including follow up instructions, medication list, d/c activities limitations if indicated, with other d/c instructions as indicated by MD - patient able to verbalize understanding, all questions fully answered.    Patient instructed to return to ED, call 911, or call MD for any changes in condition.    Patient escorted via [place], and D/C home via With son.

## 2020-10-01 NOTE — Care Management (Signed)
    Durable Medical Equipment  (From admission, onward)         Start     Ordered   10/01/20 1139  For home use only DME standard manual wheelchair with seat cushion  Once       Comments: Patient suffers from Left knee effusion  which impairs their ability to perform daily activities like ambulating in the home.  A cane  will not resolve issue with performing activities of daily living. A wheelchair will allow patient to safely perform daily activities. Patient can safely propel the wheelchair in the home or has a caregiver who can provide assistance. Length of need 12 weeks . Accessories: elevating leg rests (ELRs), wheel locks, extensions and anti-tippers.  Seat and back cushion   10/01/20 1139   10/01/20 1119  For home use only DME 3 n 1  Once        10/01/20 1119   10/01/20 1119  For home use only DME Walker rolling  Once       Question Answer Comment  Walker: With [place]   Patient needs a walker to treat with the following condition Weakness      10/01/20 1119

## 2020-10-01 NOTE — Progress Notes (Signed)
Inpatient Rehabilitation Admissions Coordinator   Inpatient rehab consult received. I met with patient at bedside for assessment. We discussed goals, expectations and estimated cost of care. Patient states he prefers direct d/c home due to estimated cost of care. His 55 year old son [name] can provide assist at home. He does states he has a RW but would also like a wheelchair. I have updated Dr. Verlon Au, acute team and TOC. We will sign off at this time.  Danne Baxter, RN, MSN Rehab Admissions Coordinator [phone] 10/01/2020 11:01 AM

## 2020-10-01 NOTE — Plan of Care (Signed)
Pt understanding of plan of care 
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
## 2020-10-01 NOTE — TOC Progression Note (Addendum)
Transition of Care [facility]) - Progression Note    Patient Details  Name: Riley Hood MRN: [id number] Date of Birth: 16-Feb-1966  Transition of Care [facility] CM/SW Contact  Jacalyn Lefevre [name], RN Phone Number: 10/01/2020, 11:22 AM  Clinical Narrative:     Plan for patient to go home with IV ABX. See note from yesterday.   NCM has approval from [facility] for LOG for [facility] for [facility] /ABX/labs only. Patient will need HHRN visit weekly and another visit at end of treatment , so total of 7 visits.  NCM called Tommi Rumps with [facility] he is checking to see if they can accept. Tommi Rumps can accept LOG for 7 nursing visits.   [name] with [facility] will also see patient regarding IV ABX cost and teaching.   PT recommending walker and 3 in1. Patient has walker already at home. He does want 3 in1 and is requesting wheel chair. NCM has reached out to PT. If recommended will order through [facility]. They will also discuss charity/cost with patient.   Ordered 3 in1 and wheelchair with Freda Munro with [facility].   Patient aware of all of above.      Order for BiPAP , Zach with [facility] reviewed chart. Patient will need sleep study and blood gas for BiPAP. MD aware and stated it can be done as a OP . Per son he has had CPAP  Expected Discharge Plan and Services   In-house Referral: Financial Counselor Discharge Planning Services: CM Consult Post Acute Care Choice: [place] arrangements for the past 2 months: Single Family Home                                       Social Determinants of Health (SDOH) Interventions    Readmission Risk Interventions No flowsheet data found.

## 2020-10-01 NOTE — Discharge Instructions (Signed)
Acute Knee Pain, Adult Many things can cause knee pain. Sometimes, knee pain is sudden (acute) and may be caused by damage, swelling, or irritation of the muscles and tissues that support your knee. The pain often goes away on its own with time and rest. If the pain does not go away, tests may be done to find out what is causing the pain. Follow these instructions at home: If you have a knee sleeve or brace:  Wear the knee sleeve or brace as told by your doctor. Take it off only as told by your doctor.  Loosen it if your toes: ? Tingle. ? Become numb. ? Turn cold and blue.  Keep it clean.  If the knee sleeve or brace is not waterproof: ? Do not let it get wet. ? Cover it with a watertight covering when you take a bath or shower.   Activity  Rest your knee.  Do not do things that cause pain or make pain worse.  Avoid activities where both feet leave the ground at the same time (high-impact activities). Examples are running, jumping rope, and doing jumping jacks.  Work with a physical therapist to make a safe exercise program, as told by your doctor. Managing pain, stiffness, and swelling  If told, put ice on the knee. To do this: ? If you have a removable knee sleeve or brace, take it off as told by your doctor. ? Put ice in a plastic bag. ? Place a towel between your skin and the bag. ? Leave the ice on for 20 minutes, 2-3 times a day. ? Take off the ice if your skin turns bright red. This is very important. If you cannot feel pain, heat, or cold, you have a greater risk of damage to the area.  If told, use an elastic bandage to put pressure (compression) on your injured knee.  Raise your knee above the level of your heart while you are sitting or lying down.  Sleep with a pillow under your knee.   General instructions  Take over-the-counter and prescription medicines only as told by your doctor.  Do not smoke or use any products that contain nicotine or tobacco. If you  need help quitting, ask your doctor.  If you are overweight, work with your doctor and a food expert (dietitian) to set goals to lose weight. Being overweight can make your knee hurt more.  Watch for any changes in your symptoms.  Keep all follow-up visits. Contact a doctor if:  The knee pain does not stop.  The knee pain changes or gets worse.  You have a fever along with knee pain.  Your knee is red or feels warm when you touch it.  Your knee gives out or locks up. Get help right away if:  Your knee swells, and the swelling gets worse.  You cannot move your knee.  You have very bad knee pain that does not get better with pain medicine. Summary  Many things can cause knee pain. The pain often goes away on its own with time and rest.  Your doctor may do tests to find out the cause of the pain.  Watch for any changes in your symptoms. Relieve your pain with rest, medicines, light activity, and use of ice.  Get help right away if you cannot move your knee or your knee pain is very bad. This information is not intended to replace advice given to you by your health care provider. Make sure you discuss   any questions you have with your health care provider. Document Revised: 12/18/2019 Document Reviewed: 12/18/2019 Elsevier Patient Education  2021 [employer] Inc.  

## 2020-10-01 NOTE — Progress Notes (Signed)
Pharmacy Antibiotic Note  Riley Hood is a 55 y.o. male admitted on 09/23/2020 with bacteremia.  Pharmacy has been consulted for Ancef dosing.   ID: Sepsis 2/2 E.coli bacteremia of unknown sourse. PICC 3/16. - 3/14: TEE: No vegetations - Tmax down to 99.6. WBC 10.4 rising. Scr <1 - PCT 59.91>37.4>17.63>9.15>1.01  Ancef 3/14>> Ceftriaxone 3/10>> 3/14 Vanc x1 3/10 >3/10 Zosyn x1 3/10>3/10   3/11 BCx : neg 3/10 BCx x2: GNR >> E. Coli: Pan sensitive, Resistant to Amp,  Intermed to Unasyn, septra 3/9 covid: neg, flu: neg 3/12: MRSA PCR: neg 3/14: BC x 2>>ngtd x 2d 3/15: synovial fluid Cx: ngtd <24h   Plan: Ancef 2g IV q8hr. - OPAT orders placed 3/16, end date 11/05/20    Height: 6' (182.9 cm) Weight: 129.3 kg (285 lb) IBW/kg (Calculated) : 77.6  Temp (24hrs), Avg:98.2 F (36.8 C), Min:97.9 F (36.6 C), Max:98.5 F (36.9 C)  Recent Labs  Lab 09/24/20 1452 09/24/20 1809 09/24/20 2054 09/24/20 2059 09/25/20 0938 09/26/20 0200 09/27/20 0121 09/28/20 0654 09/29/20 0048 09/30/20 0219 10/01/20 0202  WBC  --   --   --    < >  --    < > 9.0 8.0 8.0 9.7 10.4  CREATININE  --   --   --    < >  --    < > 1.28* 0.93 0.92 0.82 0.87  LATICACIDVEN 2.9* 1.9 2.6*  --  1.2  --   --   --   --   --   --    < > = values in this interval not displayed.    Estimated Creatinine Clearance: 133.4 mL/min (by C-G formula based on SCr of 0.87 mg/dL).    Not on File   Riley Hood S. Alford Highland, PharmD, BCPS Clinical Staff Pharmacist [email or web address] Riley Hood, Riley Hood 10/01/2020 12:56 PM

## 2020-10-01 NOTE — Progress Notes (Signed)
Pt transported to MRI 

## 2020-10-01 NOTE — Discharge Summary (Signed)
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 valve regurgitation is not demonstrated. No evidence of tricuspid stenosis. Aortic Valve: The aortic valve has an indeterminant number of cusps. There is moderate calcification of the aortic valve. There is moderate thickening of the aortic valve. Aortic valve regurgitation is not visualized. Aortic valve mean gradient measures 41.7 mmHg. Aortic valve peak gradient measures 69.9 mmHg. Aortic valve area, by VTI measures 1.09 cm. Pulmonic Valve: The pulmonic valve was not well visualized. Pulmonic valve regurgitation is not visualized. Aorta: The aortic root and ascending aorta are structurally normal, with no evidence of dilitation. Venous: The inferior vena cava is normal in size with greater than 50% respiratory variability, suggesting right atrial pressure of 3 mmHg. IAS/Shunts: The atrial septum is grossly normal.  LEFT VENTRICLE PLAX 2D LVIDd:         3.90 cm  Diastology LVIDs:  2.00 cm  LV e' medial:  4.68 cm/s LV PW:         1.75 cm  LV e' lateral: 11.50 cm/s LV IVS:         1.65 cm LVOT diam:     2.20 cm LV SV:         73 LV SV Index:   29 LVOT Area:     3.80 cm  RIGHT VENTRICLE TAPSE (M-mode): 3.0 cm LEFT ATRIUM             Index       RIGHT ATRIUM           Index LA diam:        3.90 cm 1.56 cm/m  RA Area:     10.50 cm LA Vol (A2C):   42.4 ml 16.98 ml/m RA Volume:   21.70 ml  8.69 ml/m LA Vol (A4C):   56.1 ml 22.46 ml/m LA Biplane Vol: 50.7 ml 20.30 ml/m  AORTIC VALVE AV Area (Vmax):    1.25 cm AV Area (Vmean):   0.96 cm AV Area (VTI):     1.09 cm AV Vmax:           418.00 cm/s AV Vmean:          291.667 cm/s AV VTI:            0.675 m AV Peak Grad:      69.9 mmHg AV Mean Grad:      41.7 mmHg LVOT Vmax:         137.33 cm/s LVOT Vmean:        73.333 cm/s LVOT VTI:          0.193 m LVOT/AV VTI ratio: 0.29  AORTA Ao Root diam: 2.50 cm MV A velocity: 143.00 cm/s                             SHUNTS                             Systemic VTI:  0.19 m                             Systemic Diam: 2.20 cm Buford Dresser MD Electronically signed by Buford Dresser MD Signature Date/Time: 09/24/2020/7:56:44 PM    Final    ECHO TEE  Result Date: 09/29/2020    TRANSESOPHOGEAL ECHO REPORT   Patient Name:   Riley Hood Date of Exam: 09/29/2020 Medical Rec #:  [id number]         Height:       72.0 in Accession #:    [id number]        Weight:       285.0 lb Date of Birth:  [birth year]          BSA:          2.477 m Patient Age:    56 years          BP:           121/69 mmHg Patient Gender: M                 HR:           97 bpm. Exam Location:  Inpatient Procedure: Transesophageal Echo, Color Doppler, Cardiac Doppler and 3D Echo Indications:     Bacteremia  History:         Patient has prior history of Echocardiogram examinations, most                  recent 09/24/2020. Aortic Valve Disease; Risk                  Factors:Hypertension, Diabetes and Dyslipidemia.  Sonographer:     Mikki Santee RDCS (AE) Referring Phys:  [place] Diagnosing Phys: Cherlynn Kaiser MD PROCEDURE: After discussion of the risks and benefits of a TEE, an informed consent was obtained from the patient. TEE procedure time was 24 minutes. The transesophogeal probe was passed without difficulty through the esophogus of the patient. Imaged were obtained with the patient in a left lateral decubitus position. Local oropharyngeal anesthetic was provided with Cetacaine. Sedation performed by different physician. The patient was monitored while under deep sedation. Anesthestetic sedation was provided intravenously by Anesthesiology: 587.2mg  of Propofol, 100mg  of Lidocaine. Image quality was good. The patient's vital signs; including heart rate, blood pressure, and oxygen saturation; remained stable throughout the procedure. The patient developed no complications during the procedure. IMPRESSIONS  1. The aortic valve is tricuspid. There is severe calcifcation of the aortic valve. Aortic valve regurgitation is trivial. Severe aortic valve stenosis. Aortic valve area, by VTI measures 0.91 cm. Aortic valve mean gradient measures 50.0 mmHg. Aortic valve Vmax measures 4.40 m/s.  2. Left ventricular ejection fraction, by estimation, is 65 to 70%. The left ventricle has normal function. There is moderate concentric left ventricular hypertrophy.  3. Right ventricular systolic function is normal. The right ventricular size is normal.  4. Left atrial size was mildly dilated. 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 The LAA emptying velocity was 100 cm/s.  5. The mitral valve is normal in structure. Trivial mitral valve regurgitation. No evidence of mitral stenosis. Conclusion(s)/Recommendation(s): No evidence of vegetation/infective endocarditis on this transesophageal echocardiogram. FINDINGS  Left Ventricle: Left ventricular ejection fraction, by estimation, is 65 to 70%. The left ventricle has normal function. The left ventricular internal cavity size was normal in size. There is  moderate concentric left ventricular hypertrophy. Right Ventricle: The right ventricular size is normal. No increase in right ventricular wall thickness. Right ventricular systolic function is normal. Left Atrium: Left atrial size was mildly dilated. 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 The LAA emptying velocity was 100 cm/s. Right Atrium: Right atrial size was normal in size. Pericardium: Trivial pericardial effusion is present. Mitral Valve: The mitral valve is normal in structure. Trivial mitral valve regurgitation. No evidence of mitral valve stenosis. Tricuspid Valve: The tricuspid valve is normal in structure. Tricuspid valve regurgitation is trivial. There is no evidence of tricuspid valve vegetation. Aortic Valve: The aortic valve is tricuspid. There is severe calcifcation of the aortic valve. Aortic valve regurgitation is trivial. Severe aortic stenosis is present. Aortic valve mean gradient measures 50.0 mmHg. Aortic valve peak gradient measures 77.4 mmHg. Aortic valve area, by VTI measures 0.91 cm. Pulmonic Valve: The pulmonic valve was normal in structure. Pulmonic valve regurgitation is trivial. Aorta: The aortic root and ascending aorta are structurally normal, with no evidence of dilitation. There is minimal (Grade I) plaque. IAS/Shunts: No atrial level shunt detected by color flow Doppler. Agitated saline contrast was given intravenously to evaluate for intracardiac shunting.  LEFT VENTRICLE PLAX 2D LVOT diam:     2.30 cm LV SV:         76 LV SV Index:  31 LVOT Area:     4.15 cm  AORTIC VALVE AV Area (Vmax):    0.95 cm AV Area (Vmean):   0.88 cm AV Area (VTI):     0.91 cm AV Vmax:           440.00 cm/s AV Vmean:          333.000 cm/s AV VTI:            0.841 m AV Peak Grad:      77.4 mmHg AV Mean Grad:      50.0 mmHg LVOT Vmax:         101.00 cm/s LVOT Vmean:        70.600 cm/s LVOT VTI:          0.184 m LVOT/AV VTI ratio: 0.22  AORTA Ao Root diam: 3.40 cm Ao Asc diam:   3.50 cm  SHUNTS Systemic VTI:  0.18 m Systemic Diam: 2.30 cm Cherlynn Kaiser MD Electronically signed by Cherlynn Kaiser MD Signature Date/Time: 09/29/2020/2:12:36 PM    Final    [place] EKG SITE RITE  Result Date: 09/30/2020 If Site Rite image not attached, placement could not be confirmed due to current cardiac rhythm.   Microbiology: Recent Results (from the past 240 hour(s))  Blood culture (routine x 2)     Status: Abnormal   Collection Time: 09/23/20  9:23 PM   Specimen: BLOOD RIGHT HAND  Result Value Ref Range Status   Specimen Description BLOOD RIGHT HAND  Final   Special Requests   Final    BOTTLES DRAWN AEROBIC AND ANAEROBIC Blood Culture adequate volume   Culture  Setup Time   Final    GRAM NEGATIVE RODS IN BOTH AEROBIC AND ANAEROBIC BOTTLES CRITICAL VALUE NOTED.  VALUE IS CONSISTENT WITH PREVIOUSLY REPORTED AND CALLED VALUE.    Culture (A)  Final    ESCHERICHIA COLI SUSCEPTIBILITIES PERFORMED ON PREVIOUS CULTURE WITHIN THE LAST 5 DAYS. Performed at [facility], [place] [address], [place], [place] [address]    Report Status 09/27/2020 FINAL  Final  Blood culture (routine x 2)     Status: Abnormal   Collection Time: 09/23/20  9:23 PM   Specimen: BLOOD  Result Value Ref Range Status   Specimen Description BLOOD RIGHT ANTECUBITAL  Final   Special Requests   Final    BOTTLES DRAWN AEROBIC AND ANAEROBIC Blood Culture results may not be optimal due to an excessive volume of blood received in culture bottles   Culture  Setup Time   Final    GRAM NEGATIVE RODS IN BOTH AEROBIC AND ANAEROBIC BOTTLES CRITICAL RESULT CALLED TO, READ BACK BY AND VERIFIED WITH: Sharen Heck PharmD 11:10 09/24/20 ([email or web address]) Performed at [facility], [place] [address], [place],  [address]    Culture ESCHERICHIA COLI (A)  Final   Report Status 09/27/2020 FINAL  Final   Organism ID, Bacteria ESCHERICHIA COLI  Final      Susceptibility   Escherichia coli - MIC*    AMPICILLIN >=32 RESISTANT  Resistant     CEFAZOLIN <=4 SENSITIVE Sensitive     CEFEPIME <=0.12 SENSITIVE Sensitive     CEFTAZIDIME <=1 SENSITIVE Sensitive     CEFTRIAXONE <=0.25 SENSITIVE Sensitive     CIPROFLOXACIN <=0.25 SENSITIVE Sensitive     GENTAMICIN <=1 SENSITIVE Sensitive     IMIPENEM <=0.25 SENSITIVE Sensitive     TRIMETH/SULFA >=320 RESISTANT Resistant     AMPICILLIN/SULBACTAM 16 INTERMEDIATE Intermediate     PIP/TAZO <=  4 SENSITIVE Sensitive     * ESCHERICHIA COLI  Blood Culture ID Panel (Reflexed)     Status: Abnormal   Collection Time: 09/23/20  9:23 PM  Result Value Ref Range Status   Enterococcus faecalis NOT DETECTED NOT DETECTED Final   Enterococcus Faecium NOT DETECTED NOT DETECTED Final   Listeria monocytogenes NOT DETECTED NOT DETECTED Final   Staphylococcus species NOT DETECTED NOT DETECTED Final   Staphylococcus aureus (BCID) NOT DETECTED NOT DETECTED Final   Staphylococcus epidermidis NOT DETECTED NOT DETECTED Final   Staphylococcus lugdunensis NOT DETECTED NOT DETECTED Final   Streptococcus species NOT DETECTED NOT DETECTED Final   Streptococcus agalactiae NOT DETECTED NOT DETECTED Final   Streptococcus pneumoniae NOT DETECTED NOT DETECTED Final   Streptococcus pyogenes NOT DETECTED NOT DETECTED Final   A.calcoaceticus-baumannii NOT DETECTED NOT DETECTED Final   Bacteroides fragilis NOT DETECTED NOT DETECTED Final   Enterobacterales DETECTED (A) NOT DETECTED Final    Comment: Enterobacterales represent a large order of gram negative bacteria, not a single organism. CRITICAL RESULT CALLED TO, READ BACK BY AND VERIFIED WITH: Sharen Heck PharmD 11:10 09/24/20 ([email or web address])    Enterobacter cloacae complex NOT DETECTED NOT DETECTED Final   Escherichia coli DETECTED (A) NOT DETECTED Final   Klebsiella aerogenes NOT DETECTED NOT DETECTED Final   Klebsiella oxytoca NOT DETECTED NOT DETECTED Final    Comment: CRITICAL RESULT CALLED TO, READ BACK BY AND VERIFIED WITH: Sharen Heck PharmD 11:15  09/24/20 ([email or web address])    Klebsiella pneumoniae NOT DETECTED NOT DETECTED Final   Proteus species NOT DETECTED NOT DETECTED Final   Salmonella species NOT DETECTED NOT DETECTED Final   Serratia marcescens NOT DETECTED NOT DETECTED Final   Haemophilus influenzae NOT DETECTED NOT DETECTED Final   Neisseria meningitidis NOT DETECTED NOT DETECTED Final   Pseudomonas aeruginosa NOT DETECTED NOT DETECTED Final   Stenotrophomonas maltophilia NOT DETECTED NOT DETECTED Final   Candida albicans NOT DETECTED NOT DETECTED Final   Candida auris NOT DETECTED NOT DETECTED Final   Candida glabrata NOT DETECTED NOT DETECTED Final   Candida krusei NOT DETECTED NOT DETECTED Final   Candida parapsilosis NOT DETECTED NOT DETECTED Final   Candida tropicalis NOT DETECTED NOT DETECTED Final   Cryptococcus neoformans/gattii NOT DETECTED NOT DETECTED Final   CTX-M ESBL NOT DETECTED NOT DETECTED Final   Carbapenem resistance IMP NOT DETECTED NOT DETECTED Final   Carbapenem resistance KPC NOT DETECTED NOT DETECTED Final   Carbapenem resistance NDM NOT DETECTED NOT DETECTED Final   Carbapenem resist OXA 48 LIKE NOT DETECTED NOT DETECTED Final   Carbapenem resistance VIM NOT DETECTED NOT DETECTED Final    Comment: Performed at [facility], [address], [place], [place] [address]  Resp Panel by RT-PCR (Flu A&B, Covid) Nasopharyngeal Swab     Status: None   Collection Time: 09/23/20 10:00 PM   Specimen: Nasopharyngeal Swab; Nasopharyngeal(NP) swabs in vial transport medium  Result Value Ref Range Status   SARS Coronavirus 2 by RT PCR NEGATIVE NEGATIVE Final    Comment: (NOTE) SARS-CoV-2 target nucleic acids are NOT DETECTED.  The SARS-CoV-2 RNA is generally detectable in upper respiratory specimens during the acute phase of infection. The lowest concentration of SARS-CoV-2 viral copies this assay can detect is 138 copies/mL. A negative result does not preclude SARS-Cov-2 infection and should not be used as  the sole basis for treatment or other patient management decisions. A negative result may occur with  improper specimen collection/handling, submission of specimen other  than nasopharyngeal swab, presence of viral mutation(s) within the areas targeted by this assay, and inadequate number of viral copies(<138 copies/mL). A negative result must be combined with clinical observations, patient history, and epidemiological information. The expected result is Negative.  Fact Sheet for Patients:  BloggerCourse.com  Fact Sheet for Healthcare Providers:  SeriousBroker.it  This test is no t yet approved or cleared by the [place] FDA and  has been authorized for detection and/or diagnosis of SARS-CoV-2 by FDA under an Emergency Use Authorization (EUA). This EUA will remain  in effect (meaning this test can be used) for the duration of the COVID-19 declaration under Section 564(b)(1) of the Act, 21 U.S.C.section 360bbb-3(b)(1), unless the authorization is terminated  or revoked sooner.       Influenza A by PCR NEGATIVE NEGATIVE Final   Influenza B by PCR NEGATIVE NEGATIVE Final    Comment: (NOTE) The Xpert Xpress SARS-CoV-2/FLU/RSV plus assay is intended as an aid in the diagnosis of influenza from Nasopharyngeal swab specimens and should not be used as a sole basis for treatment. Nasal washings and aspirates are unacceptable for Xpert Xpress SARS-CoV-2/FLU/RSV testing.  Fact Sheet for Patients: BloggerCourse.com  Fact Sheet for Healthcare Providers: SeriousBroker.it  This test is not yet approved or cleared by the [place] FDA and has been authorized for detection and/or diagnosis of SARS-CoV-2 by FDA under an Emergency Use Authorization (EUA). This EUA will remain in effect (meaning this test can be used) for the duration of the COVID-19 declaration under Section 564(b)(1) of  the Act, 21 U.S.C. section 360bbb-3(b)(1), unless the authorization is terminated or revoked.  Performed at [facility], [address] [place], [place] [address]   Culture, blood (routine x 2)     Status: None   Collection Time: 09/25/20  9:29 AM   Specimen: BLOOD  Result Value Ref Range Status   Specimen Description BLOOD LEFT ANTECUBITAL  Final   Special Requests   Final    BOTTLES DRAWN AEROBIC AND ANAEROBIC Blood Culture results may not be optimal due to an inadequate volume of blood received in culture bottles   Culture   Final    NO GROWTH 5 DAYS Performed at [facility], [address], [place], [place] [address]    Report Status 09/30/2020 FINAL  Final  Culture, blood (routine x 2)     Status: None   Collection Time: 09/25/20  9:38 AM   Specimen: BLOOD  Result Value Ref Range Status   Specimen Description BLOOD LEFT ANTECUBITAL  Final   Special Requests   Final    BOTTLES DRAWN AEROBIC ONLY Blood Culture adequate volume   Culture   Final    NO GROWTH 5 DAYS Performed at [facility], [address], [place], [place] [address]    Report Status 09/30/2020 FINAL  Final  MRSA PCR Screening     Status: None   Collection Time: 09/26/20  8:00 AM   Specimen: Nasal Mucosa; Nasopharyngeal  Result Value Ref Range Status   MRSA by PCR NEGATIVE NEGATIVE Final    Comment:        The GeneXpert MRSA Assay (FDA approved for NASAL specimens only), is one component of a comprehensive MRSA colonization surveillance program. It is not intended to diagnose MRSA infection nor to guide or monitor treatment for MRSA infections. Performed at [facility], [address] [place], [place] [address]   Culture, blood (Routine X 2) w Reflex  to ID Panel     Status: None (Preliminary result)   Collection Time: 09/28/20 12:13 PM   Specimen: BLOOD LEFT HAND  Result Value Ref Range Status   Specimen Description BLOOD LEFT HAND  Final   Special Requests   Final     BOTTLES DRAWN AEROBIC AND ANAEROBIC Blood Culture adequate volume   Culture   Final    NO GROWTH 3 DAYS Performed at [facility], [address], [place], [place] [address]    Report Status PENDING  Incomplete  Culture, blood (Routine X 2) w Reflex to ID Panel     Status: None (Preliminary result)   Collection Time: 09/28/20 12:13 PM   Specimen: BLOOD LEFT WRIST  Result Value Ref Range Status   Specimen Description BLOOD LEFT WRIST  Final   Special Requests   Final    BOTTLES DRAWN AEROBIC AND ANAEROBIC Blood Culture adequate volume   Culture   Final    NO GROWTH 3 DAYS Performed at [facility], [place] [address], [place], [place] [address]    Report Status PENDING  Incomplete  Body fluid culture w Gram Stain     Status: None (Preliminary result)   Collection Time: 09/29/20  3:09 PM   Specimen: Body Fluid  Result Value Ref Range Status   Specimen Description FLUID SYNOVIAL  Final   Special Requests NONE  Final   Gram Stain   Final    RARE WBC PRESENT, PREDOMINANTLY MONONUCLEAR NO ORGANISMS SEEN    Culture   Final    NO GROWTH 2 DAYS Performed at [facility], [address], [place], [place] [address]    Report Status PENDING  Incomplete     Labs: Basic Metabolic Panel: Recent Labs  Lab 09/27/20 0121 09/28/20 0654 09/29/20 0048 09/30/20 0219 10/01/20 0202  NA 135 135 135 135 137  K 3.8 3.6 3.7 4.2 4.3  CL 105 105 103 103 102  CO2 $Re'24 24 26 27 28  'VjD$ GLUCOSE 195* 162* 108* 220* 183*  BUN $Re'20 15 12 11 13  'Ejw$ CREATININE 1.28* 0.93 0.92 0.82 0.87  CALCIUM 8.2* 8.2* 8.5* 8.8* 9.0  MG  --  1.8  --   --  1.6*   Liver Function Tests: Recent Labs  Lab 09/25/20 0053 09/26/20 0200 09/27/20 0121 09/29/20 0048 10/01/20 0202  AST 77* 69* 44* 39 38  ALT 43 42 39 43 42  ALKPHOS 75 134* 100 76 75  BILITOT 0.6 0.6 0.4 0.5 0.6  PROT 5.8* 6.1* 5.7* 5.8* 6.8  ALBUMIN 2.4* 2.1* 1.9* 1.9* 1.9*   No results for input(s): LIPASE, AMYLASE in the last 168  hours. No results for input(s): AMMONIA in the last 168 hours. CBC: Recent Labs  Lab 09/24/20 2059 09/25/20 0053 09/27/20 0121 09/28/20 0654 09/29/20 0048 09/30/20 0219 10/01/20 0202  WBC 8.8   < > 9.0 8.0 8.0 9.7 10.4  NEUTROABS 7.8*  --  6.6 5.2 4.3  --  7.0  HGB 12.1*   < > 10.9* 10.1* 9.9* 10.2* 10.8*  HCT 34.9*   < > 31.3* 27.9* 28.7* 28.6* 31.8*  MCV 74.3*   < > 73.1* 71.7* 73.2* 72.4* 74.0*  PLT 146*   < > 99* 130* 193 304 430*   < > = values in this interval not displayed.   Cardiac Enzymes: No results for input(s): CKTOTAL, CKMB, CKMBINDEX, TROPONINI in the last 168 hours. BNP: BNP (last 3 results) No results for input(s):  BNP in the last 8760 hours.  ProBNP (last 3 results) No results for input(s): PROBNP in the last 8760 hours.  CBG: Recent Labs  Lab 09/30/20 1134 09/30/20 1558 09/30/20 2139 10/01/20 0628 10/01/20 1059  GLUCAP 188* 141* 200* 157* 148*       Signed:  Nita Sells MD   [facility] 10/01/2020, 2:21 PM

## 2020-10-01 NOTE — Progress Notes (Signed)
Physical Therapy Treatment Patient Details Name: Riley Hood MRN: [id number] DOB: 1966/04/01 Today's Date: 10/01/2020    History of Present Illness 55 y.o. male admitted on 09/23/20 with chief complaint of LBP and diagnosis of sepsis. 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    PT Comments    Pt received in bed, cooperative and pleasant. Today's session focused on reviewing HEP to address LE strengthening, ROM, and L adductor injury/tear as well as wheelchair mobility education. Educated on parts of wheelchair, their function, and appropriate way to ascend/descend stairs with 2 person assist. Pt verbalized understanding. Pt left in bed with all needs met, call bell within reach, bed alarm active, and electronic copy of HEP. Will continue to monitor acutely.  Medbridge code [id number]   Follow Up Recommendations  Supervision for mobility/OOB;Home health PT (Would benefit most from SNF, but limited due to financial situation. Maximize PT during admission)     Equipment Recommendations  Wheelchair (measurements PT);Wheelchair cushion (measurements PT);3in1 (PT)    Recommendations for Other Services       Precautions / Restrictions Precautions Precautions: Fall Precaution Comments: severe back pain Restrictions Weight Bearing Restrictions: No    Mobility  Bed Mobility                    Transfers                    Ambulation/Gait                 Stairs             Wheelchair Mobility    Modified Rankin (Stroke Patients Only)       Balance Overall balance assessment: Needs assistance Sitting-balance support: Feet supported;No upper extremity supported Sitting balance-Leahy Scale: Fair Sitting balance - Comments: min guard for safety   Standing balance support: Bilateral upper extremity supported;During functional activity Standing balance-Leahy Scale: Poor Standing  balance comment: relies on BUE and external support                            Cognition Arousal/Alertness: Awake/alert Behavior During Therapy: Anxious Overall Cognitive Status: Within Functional Limits for tasks assessed                                 General Comments: Seems WFL however no family present to determine baseline      Exercises General Exercises - Lower Extremity Hip ABduction/ADduction: AROM;Both;10 reps Straight Leg Raises: AROM;Both;10 reps Other Exercises Other Exercises: Supine hamstring stretch with blanket, 3x30" bilaterally Other Exercises: Supine gastroc stretch with blanket, 3x30" bilaterally Other Exercises: Supine isometric hip abd/adduction, x10 with 3-5 second holds bilaterally Other Exercises: Supine isometric hip extension, x10 with 3-5 second hold bilaterally    General Comments        Pertinent Vitals/Pain Pain Assessment: Faces Faces Pain Scale: Hurts little more Pain Location: back and L knee Pain Descriptors / Indicators: Guarding;Radiating;Heaviness Pain Intervention(s): Monitored during session    Home Living                      Prior Function            PT Goals (current goals can now be found in the care plan section) Acute Rehab PT Goals Patient Stated Goal: less pain    Frequency  Min 5X/week      PT Plan Discharge plan needs to be updated    Co-evaluation              AM-PAC PT "6 Clicks" Mobility   Outcome Measure  Help needed turning from your back to your side while in a flat bed without using bedrails?: A Little Help needed moving from lying on your back to sitting on the side of a flat bed without using bedrails?: A Lot Help needed moving to and from a bed to a chair (including a wheelchair)?: A Lot Help needed standing up from a chair using your arms (e.g., wheelchair or bedside chair)?: A Lot Help needed to walk in hospital room?: A Lot Help needed climbing 3-5  steps with a railing? : A Lot 6 Click Score: 13    End of Session   Activity Tolerance: Patient tolerated treatment well Patient left: in bed;with bed alarm set;with call bell/phone within reach;with family/visitor present Nurse Communication: Mobility status PT Visit Diagnosis: Pain;Difficulty in walking, not elsewhere classified (R26.2) Pain - Right/Left:  (Central) Pain - part of body:  (Back)     Time:  -     Charges:                        Rosita Kea, SPT

## 2020-10-02 ENCOUNTER — Telehealth: Payer: Self-pay | Admitting: *Deleted

## 2020-10-02 LAB — BODY FLUID CULTURE W GRAM STAIN: Culture: NO GROWTH

## 2020-10-02 NOTE — Telephone Encounter (Signed)
Transition Care Management Follow-up Telephone Call  Date of discharge and from where: 10/01/2020 - [facility]  How have you been since you were released from the hospital? "Fine"  Any questions or concerns? No  Items Reviewed:  Did the pt receive and understand the discharge instructions provided? Yes   Medications obtained and verified? Yes   Other? No   Any new allergies since your discharge? No   Dietary orders reviewed? No  Do you have support at home? Yes   Home Care and Equipment/Supplies: Were home health services ordered? not applicable If so, what is the name of the agency? N/A  Has the agency set up a time to come to the patient's home? not applicable Were any new equipment or medical supplies ordered?  Yes: Wheelchair, walker What is the name of the medical supply agency? [employer] Were you able to get the supplies/equipment? Should be getting in a few days Do you have any questions related to the use of the equipment or supplies? No  Functional Questionnaire: (I = Independent and D = Dependent) ADLs: I  Bathing/Dressing- I  Meal Prep- I  Eating- I  Maintaining continence- I  Transferring/Ambulation- D - walker, wheelchair  Managing Meds- I  Follow up appointments reviewed:   PCP Hospital f/u appt confirmed? Yes  Scheduled to see Dr. Joya Gaskins on 10/21/2020 @ 0930.  [facility] f/u appt confirmed? Yes  Scheduled to see Infectious Disease on 10/22/2020 @ 1115 and Cardiology on 11/10/2020 @ 0845.  Are transportation arrangements needed? No   If their condition worsens, is the pt aware to call PCP or go to the Emergency Dept.? Yes  Was the patient provided with contact information for the PCP's office or ED? Yes  Was to pt encouraged to call back with questions or concerns? Yes

## 2020-10-03 LAB — CULTURE, BLOOD (ROUTINE X 2)
Culture: NO GROWTH
Culture: NO GROWTH
Special Requests: ADEQUATE
Special Requests: ADEQUATE

## 2020-10-05 ENCOUNTER — Encounter: Payer: Self-pay | Admitting: Infectious Disease

## 2020-10-06 MED FILL — NovoLOG 100 UNIT/ML SOLN: 100 | 16 days supply | Qty: 10 | Fill #0

## 2020-10-06 MED FILL — NOVOLOG MIX 70/30 VIAL: (70-30) 100 | 16 days supply | Qty: 10 | Fill #1

## 2020-10-12 ENCOUNTER — Encounter: Payer: Self-pay | Admitting: Infectious Disease

## 2020-10-16 ENCOUNTER — Telehealth: Payer: Self-pay

## 2020-10-16 NOTE — Telephone Encounter (Signed)
Received call from [place] at [facility], he states home health nurse reports patient was complaining of increased lower back pain and dizziness. Home health nurse went to patient's home to assess him today and states the patient is doing better now, reports stable vitals. She is concerned that his urine is slightly dark.  Jeani Hawking states he advised patient to increase fluids. Patient scheduled for follow-up with Dr. Tommy Medal 10/22/20.    Beryle Flock, RN

## 2020-10-18 NOTE — Telephone Encounter (Signed)
Thanks Megan!

## 2020-10-21 ENCOUNTER — Encounter: Payer: Self-pay | Admitting: Critical Care Medicine

## 2020-10-21 ENCOUNTER — Other Ambulatory Visit: Payer: Self-pay

## 2020-10-21 ENCOUNTER — Ambulatory Visit: Payer: Self-pay | Attending: Critical Care Medicine | Admitting: Critical Care Medicine

## 2020-10-21 VITALS — BP 126/88 | HR 99 | Resp 16

## 2020-10-21 DIAGNOSIS — B9620 Unspecified Escherichia coli [E. coli] as the cause of diseases classified elsewhere: Secondary | ICD-10-CM

## 2020-10-21 DIAGNOSIS — A4151 Sepsis due to Escherichia coli [E. coli]: Secondary | ICD-10-CM

## 2020-10-21 DIAGNOSIS — I10 Essential (primary) hypertension: Secondary | ICD-10-CM

## 2020-10-21 DIAGNOSIS — K029 Dental caries, unspecified: Secondary | ICD-10-CM

## 2020-10-21 DIAGNOSIS — E1169 Type 2 diabetes mellitus with other specified complication: Secondary | ICD-10-CM

## 2020-10-21 DIAGNOSIS — E1165 Type 2 diabetes mellitus with hyperglycemia: Secondary | ICD-10-CM

## 2020-10-21 DIAGNOSIS — E611 Iron deficiency: Secondary | ICD-10-CM

## 2020-10-21 DIAGNOSIS — E782 Mixed hyperlipidemia: Secondary | ICD-10-CM

## 2020-10-21 DIAGNOSIS — G8929 Other chronic pain: Secondary | ICD-10-CM

## 2020-10-21 DIAGNOSIS — Z6841 Body Mass Index (BMI) 40.0 and over, adult: Secondary | ICD-10-CM

## 2020-10-21 DIAGNOSIS — B351 Tinea unguium: Secondary | ICD-10-CM

## 2020-10-21 DIAGNOSIS — R6520 Severe sepsis without septic shock: Secondary | ICD-10-CM

## 2020-10-21 DIAGNOSIS — M25562 Pain in left knee: Secondary | ICD-10-CM

## 2020-10-21 DIAGNOSIS — R7881 Bacteremia: Secondary | ICD-10-CM

## 2020-10-21 DIAGNOSIS — I350 Nonrheumatic aortic (valve) stenosis: Secondary | ICD-10-CM

## 2020-10-21 DIAGNOSIS — E1100 Type 2 diabetes mellitus with hyperosmolarity without nonketotic hyperglycemic-hyperosmolar coma (NKHHC): Secondary | ICD-10-CM

## 2020-10-21 DIAGNOSIS — E66813 Obesity, class 3: Secondary | ICD-10-CM

## 2020-10-21 DIAGNOSIS — E118 Type 2 diabetes mellitus with unspecified complications: Secondary | ICD-10-CM

## 2020-10-21 DIAGNOSIS — A498 Other bacterial infections of unspecified site: Secondary | ICD-10-CM

## 2020-10-21 DIAGNOSIS — Z1159 Encounter for screening for other viral diseases: Secondary | ICD-10-CM

## 2020-10-21 DIAGNOSIS — M5442 Lumbago with sciatica, left side: Secondary | ICD-10-CM

## 2020-10-21 DIAGNOSIS — Z1211 Encounter for screening for malignant neoplasm of colon: Secondary | ICD-10-CM

## 2020-10-21 DIAGNOSIS — N179 Acute kidney failure, unspecified: Secondary | ICD-10-CM

## 2020-10-21 DIAGNOSIS — G629 Polyneuropathy, unspecified: Secondary | ICD-10-CM

## 2020-10-21 LAB — GLUCOSE, POCT (MANUAL RESULT ENTRY): POC Glucose: 146 mg/dl — AB (ref 70–99)

## 2020-10-21 MED ORDER — FERROUS SULFATE 325 (65 FE) MG PO TABS
325.0000 mg | ORAL_TABLET | Freq: Two times a day (BID) | ORAL | 3 refills | Status: DC
  Filled 2020-10-21: qty 60, 30d supply, fill #0

## 2020-10-21 MED ORDER — ATORVASTATIN CALCIUM 40 MG PO TABS
40.0000 mg | ORAL_TABLET | Freq: Every day | ORAL | 12 refills | Status: DC
  Filled 2020-10-21: qty 30, 30d supply, fill #0
  Filled 2020-11-17: qty 30, 30d supply, fill #1
  Filled 2020-12-17: qty 30, 30d supply, fill #2
  Filled 2021-01-27: qty 30, 30d supply, fill #3
  Filled 2021-03-19: qty 30, 30d supply, fill #4
  Filled 2021-05-24 – 2021-05-25 (×2): qty 30, 30d supply, fill #5

## 2020-10-21 MED ORDER — BASAGLAR KWIKPEN 100 UNIT/ML ~~LOC~~ SOPN
34.0000 [IU] | PEN_INJECTOR | Freq: Every day | SUBCUTANEOUS | 2 refills | Status: DC
  Filled 2020-10-21: qty 12, 35d supply, fill #0
  Filled 2020-12-17: qty 12, 35d supply, fill #1
  Filled 2021-01-27: qty 12, 35d supply, fill #2

## 2020-10-21 MED ORDER — METHOCARBAMOL 500 MG PO TABS
500.0000 mg | ORAL_TABLET | Freq: Three times a day (TID) | ORAL | 0 refills | Status: DC | PRN
  Filled 2020-10-21: qty 20, 7d supply, fill #0
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
## 2020-10-21 NOTE — Assessment & Plan Note (Signed)
Associated with diabetes continue statin therapy follow-up lipid panel
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
## 2020-10-21 NOTE — Assessment & Plan Note (Signed)
Blood pressure under good control at this visit Continue metoprolol

## 2020-10-21 NOTE — Assessment & Plan Note (Signed)
Lumbar 2 3 discitis seen on MRI possible seeding from E. Coli  Continue IV antibiotics through April 19 and follow-up with infectious disease

## 2020-10-21 NOTE — Assessment & Plan Note (Signed)
Continue gabapentin.

## 2020-10-21 NOTE — Assessment & Plan Note (Signed)
Will attempt to get the orange card for this patient so we can send him to a dental clinic he needs multiple teeth removed

## 2020-10-21 NOTE — Assessment & Plan Note (Signed)
Multiple electrolyte abnormalities including hypomagnesemia hypophosphatemia seen during hospitalization will follow up to see if been repleted

## 2020-10-21 NOTE — Assessment & Plan Note (Signed)
This has resolved.

## 2020-10-21 NOTE — Assessment & Plan Note (Signed)
Morbid obesity given dietary measures

## 2020-10-21 NOTE — Assessment & Plan Note (Signed)
Bacteremia with E. coli unknown source possible L2-3 seeding and disc  Continue IV antibiotics follow-up with infectious disease

## 2020-10-21 NOTE — Assessment & Plan Note (Signed)
Resolved

## 2020-10-21 NOTE — Assessment & Plan Note (Signed)
Type 2 diabetes with complications including neuropathy and associated calluses on the feet and associated renal insufficiency  Will increase and change in insulin product insulin Basaglar at 34 units twice daily continue NovoLog 5 units 3 times daily with meals along with insulin sliding scale  Not a candidate for other agents

## 2020-10-21 NOTE — Assessment & Plan Note (Addendum)
Follow up with cardiology

## 2020-10-21 NOTE — Assessment & Plan Note (Signed)
As per magnesium assessment

## 2020-10-21 NOTE — Patient Instructions (Addendum)
We will arrange for you to get the pneumococcal vaccine  Change your insulin 70/30 back to insulin Basaglar 34 units twice daily I sent refills to our pharmacy  Stay on the NovoLog 5 units 3 times daily before meals as you are doing and continues a sliding scale as before  No change in other medications but I routed all of your prescriptions and refills on all of them to our pharmacy here  Keep your appointment with cardiology upcoming keep your infectious disease appointment  Complete set of lab draws will be obtained today  Please obtain an eye exam sometime in the next few months I recommend the happy [place] with Dr. Truman Hayward at [facility]  Follow a healthy diet as outlined below  We will send a referral back over to Dr. Percell Miller for your left knee to be followed up  Please pick up a financial application for orange card blue card [place] discount and process this  Return to see Lurena Joiner our clinical pharmacist in 2 weeks and see Dr. Joya Gaskins again in 6 weeks   Diabetes Mellitus and Nutrition, Adult When you have diabetes, or diabetes mellitus, it is very important to have healthy eating habits because your blood sugar (glucose) levels are greatly affected by what you eat and drink. Eating healthy foods in the right amounts, at about the same times every day, can help you:  Control your blood glucose.  Lower your risk of heart disease.  Improve your blood pressure.  Reach or maintain a healthy weight. What can affect my meal plan? Every person with diabetes is different, and each person has different needs for a meal plan. Your health care provider may recommend that you work with a dietitian to make a meal plan that is best for you. Your meal plan may vary depending on factors such as:  The calories you need.  The medicines you take.  Your weight.  Your blood glucose, blood pressure, and cholesterol levels.  Your activity level.  Other health conditions you have,  such as heart or kidney disease. How do carbohydrates affect me? Carbohydrates, also called carbs, affect your blood glucose level more than any other type of food. Eating carbs naturally raises the amount of glucose in your blood. Carb counting is a method for keeping track of how many carbs you eat. Counting carbs is important to keep your blood glucose at a healthy level, especially if you use insulin or take certain oral diabetes medicines. It is important to know how many carbs you can safely have in each meal. This is different for every person. Your dietitian can help you calculate how many carbs you should have at each meal and for each snack. How does alcohol affect me? Alcohol can cause a sudden decrease in blood glucose (hypoglycemia), especially if you use insulin or take certain oral diabetes medicines. Hypoglycemia can be a life-threatening condition. Symptoms of hypoglycemia, such as sleepiness, dizziness, and confusion, are similar to symptoms of having too much alcohol.  Do not drink alcohol if: ? Your health care provider tells you not to drink. ? You are pregnant, may be pregnant, or are planning to become pregnant.  If you drink alcohol: ? Do not drink on an empty stomach. ? Limit how much you use to:  0-1 drink a day for women.  0-2 drinks a day for men. ? Be aware of how much alcohol is in your drink. In the [place] one drink equals one 12 oz  bottle of beer (355 mL), one 5 oz glass of wine (148 mL), or one 1 oz glass of hard liquor (44 mL). ? Keep yourself hydrated with water, diet soda, or unsweetened iced tea.  Keep in mind that regular soda, juice, and other mixers may contain a lot of sugar and must be counted as carbs. What are tips for following this plan? Reading food labels  Start by checking the serving size on the "Nutrition Facts" label of packaged foods and drinks. The amount of calories, carbs, fats, and other nutrients listed on the label is based on one  serving of the item. Many items contain more than one serving per package.  Check the total grams (g) of carbs in one serving. You can calculate the number of servings of carbs in one serving by dividing the total carbs by 15. For example, if a food has 30 g of total carbs per serving, it would be equal to 2 servings of carbs.  Check the number of grams (g) of saturated fats and trans fats in one serving. Choose foods that have a low amount or none of these fats.  Check the number of milligrams (mg) of salt (sodium) in one serving. Most people should limit total sodium intake to less than 2,300 mg per day.  Always check the nutrition information of foods labeled as "low-fat" or "nonfat." These foods may be higher in added sugar or refined carbs and should be avoided.  Talk to your dietitian to identify your daily goals for nutrients listed on the label. Shopping  Avoid buying canned, pre-made, or processed foods. These foods tend to be high in fat, sodium, and added sugar.  Shop around the outside edge of the grocery store. This is where you will most often find fresh fruits and vegetables, bulk grains, fresh meats, and fresh dairy. Cooking  Use low-heat cooking methods, such as baking, instead of high-heat cooking methods like deep frying.  Cook using healthy oils, such as olive, canola, or sunflower oil.  Avoid cooking with butter, cream, or high-fat meats. Meal planning  Eat meals and snacks regularly, preferably at the same times every day. Avoid going long periods of time without eating.  Eat foods that are high in fiber, such as fresh fruits, vegetables, beans, and whole grains. Talk with your dietitian about how many servings of carbs you can eat at each meal.  Eat 4-6 oz (112-168 g) of lean protein each day, such as lean meat, chicken, fish, eggs, or tofu. One ounce (oz) of lean protein is equal to: ? 1 oz (28 g) of meat, chicken, or fish. ? 1 egg. ?  cup (62 g) of  tofu.  Eat some foods each day that contain healthy fats, such as avocado, nuts, seeds, and fish.   What foods should I eat? Fruits Berries. Apples. Oranges. Peaches. Apricots. Plums. Grapes. Mango. Papaya. Pomegranate. Kiwi. Cherries. Vegetables Lettuce. Spinach. Leafy greens, including kale, chard, collard greens, and mustard greens. Beets. Cauliflower. Cabbage. Broccoli. Carrots. Green beans. Tomatoes. Peppers. Onions. Cucumbers. Brussels sprouts. Grains Whole grains, such as whole-wheat or whole-grain bread, crackers, tortillas, cereal, and pasta. Unsweetened oatmeal. Quinoa. Brown or wild rice. Meats and other proteins Seafood. Poultry without skin. Lean cuts of poultry and beef. Tofu. Nuts. Seeds. Dairy Low-fat or fat-free dairy products such as milk, yogurt, and cheese. The items listed above may not be a complete list of foods and beverages you can eat. Contact a dietitian for more information. What foods should  I avoid? Fruits Fruits canned with syrup. Vegetables Canned vegetables. Frozen vegetables with butter or cream sauce. Grains Refined white flour and flour products such as bread, pasta, snack foods, and cereals. Avoid all processed foods. Meats and other proteins Fatty cuts of meat. Poultry with skin. Breaded or fried meats. Processed meat. Avoid saturated fats. Dairy Full-fat yogurt, cheese, or milk. Beverages Sweetened drinks, such as soda or iced tea. The items listed above may not be a complete list of foods and beverages you should avoid. Contact a dietitian for more information. Questions to ask a health care provider  Do I need to meet with a diabetes educator?  Do I need to meet with a dietitian?  What number can I call if I have questions?  When are the best times to check my blood glucose? Where to find more information:  American Diabetes Association: diabetes.org  Academy of Nutrition and Dietetics: www.eatright.[employer] of  Diabetes and Digestive and Kidney Diseases: [employer].dk  Association of Diabetes Care and Education Specialists: www.diabeteseducator.org Summary  It is important to have healthy eating habits because your blood sugar (glucose) levels are greatly affected by what you eat and drink.  A healthy meal plan will help you control your blood glucose and maintain a healthy lifestyle.  Your health care provider may recommend that you work with a dietitian to make a meal plan that is best for you.  Keep in mind that carbohydrates (carbs) and alcohol have immediate effects on your blood glucose levels. It is important to count carbs and to use alcohol carefully. This information is not intended to replace advice given to you by your health care provider. Make sure you discuss any questions you have with your health care provider. Document Revised: 06/11/2019 Document Reviewed: 06/11/2019 Elsevier Patient Education  2021 [place].     Pneumococcal Polysaccharide Vaccine (PPSV23): What You Need to Know 1. Why get vaccinated? Pneumococcal polysaccharide vaccine (PPSV23) can prevent pneumococcal disease. Pneumococcal disease refers to any illness caused by pneumococcal bacteria. These bacteria can cause many types of illnesses, including pneumonia, which is an infection of the lungs. Pneumococcal bacteria are one of the most common causes of pneumonia. Besides pneumonia, pneumococcal bacteria can also cause:  Ear infections  Sinus infections  Meningitis (infection of the tissue covering the brain and spinal cord)  Bacteremia (bloodstream infection) Anyone can get pneumococcal disease, but children under 31 years of age, people with certain medical conditions, adults 84 years or older, and cigarette smokers are at the highest risk. Most pneumococcal infections are mild. However, some can result in long-term problems, such as brain damage or hearing loss. Meningitis, bacteremia, and  pneumonia caused by pneumococcal disease can be fatal. 2. PPSV23 PPSV23 protects against 23 types of bacteria that cause pneumococcal disease. PPSV23 is recommended for:  All adults 27 years or older,  Anyone 2 years or older with certain medical conditions that can lead to an increased risk for pneumococcal disease. Most people need only one dose of PPSV23. A second dose of PPSV23, and another type of pneumococcal vaccine called PCV13, are recommended for certain high-risk groups. Your health care provider can give you more information. People 65 years or older should get a dose of PPSV23 even if they have already gotten one or more doses of the vaccine before they turned 65. 3. Talk with your health care provider Tell your vaccine provider if the person getting the vaccine:  Has had an allergic reaction after a  previous dose of PPSV23, or has any severe, life-threatening allergies. In some cases, your health care provider may decide to postpone PPSV23 vaccination to a future visit. People with minor illnesses, such as a cold, may be vaccinated. People who are moderately or severely ill should usually wait until they recover before getting PPSV23. Your health care provider can give you more information. 4. Risks of a vaccine reaction  Redness or pain where the shot is given, feeling tired, fever, or muscle aches can happen after PPSV23. People sometimes faint after medical procedures, including vaccination. Tell your provider if you feel dizzy or have vision changes or ringing in the ears. As with any medicine, there is a very remote chance of a vaccine causing a severe allergic reaction, other serious injury, or death. 5. What if there is a serious problem? An allergic reaction could occur after the vaccinated person leaves the clinic. If you see signs of a severe allergic reaction (hives, swelling of the face and throat, difficulty breathing, a fast heartbeat, dizziness, or weakness), call  9-1-1 and get the person to the nearest hospital. For other signs that concern you, call your health care provider. Adverse reactions should be reported to the Vaccine Adverse Event Reporting System (VAERS). Your health care provider will usually file this report, or you can do it yourself. Visit the VAERS website at www.vaers.SamedayNews.es or call 614-569-3007. VAERS is only for reporting reactions, and VAERS staff do not give medical advice. 6. How can I learn more?  Ask your health care provider.  Call your local or state health department.  Contact the Centers for Disease Control and Prevention (CDC): ? Call 623-156-0637 (1-800-CDC-INFO) or ? Visit CDC's website at http://hunter.com/ Vaccine Information Statement PPSV23 Vaccine (05/16/2018) This information is not intended to replace advice given to you by your health care provider. Make sure you discuss any questions you have with your health care provider. Document Revised: 03/06/2020 Document Reviewed: 03/06/2020 Elsevier Patient Education  [place].

## 2020-10-21 NOTE — Assessment & Plan Note (Signed)
Had resolved during last admission will monitor and follow-up

## 2020-10-21 NOTE — Assessment & Plan Note (Signed)
Severe onychomycosis of both feet will need to wait till he gets the orange card to send to podiatry

## 2020-10-21 NOTE — Assessment & Plan Note (Signed)
Needed intravenous iron infusion continue oral iron and follow-up iron studies

## 2020-10-22 ENCOUNTER — Ambulatory Visit (INDEPENDENT_AMBULATORY_CARE_PROVIDER_SITE_OTHER): Payer: Self-pay | Admitting: Infectious Disease

## 2020-10-22 ENCOUNTER — Telehealth: Payer: Self-pay

## 2020-10-22 ENCOUNTER — Other Ambulatory Visit: Payer: Self-pay

## 2020-10-22 ENCOUNTER — Encounter: Payer: Self-pay | Admitting: Infectious Disease

## 2020-10-22 DIAGNOSIS — M4640 Discitis, unspecified, site unspecified: Secondary | ICD-10-CM

## 2020-10-22 DIAGNOSIS — R6520 Severe sepsis without septic shock: Secondary | ICD-10-CM

## 2020-10-22 DIAGNOSIS — M4646 Discitis, unspecified, lumbar region: Secondary | ICD-10-CM

## 2020-10-22 DIAGNOSIS — A4151 Sepsis due to Escherichia coli [E. coli]: Secondary | ICD-10-CM

## 2020-10-22 DIAGNOSIS — A498 Other bacterial infections of unspecified site: Secondary | ICD-10-CM

## 2020-10-22 HISTORY — DX: Discitis, unspecified, site unspecified: M46.40

## 2020-10-22 LAB — CBC WITH DIFFERENTIAL/PLATELET
Basophils Absolute: 0.1 10*3/uL (ref 0.0–0.2)
Basos: 1 %
EOS (ABSOLUTE): 0.2 10*3/uL (ref 0.0–0.4)
Eos: 2 %
Hematocrit: 30.4 % — ABNORMAL LOW (ref 37.5–51.0)
Hemoglobin: 10 g/dL — ABNORMAL LOW (ref 13.0–17.7)
Immature Grans (Abs): 0.1 10*3/uL (ref 0.0–0.1)
Immature Granulocytes: 1 %
Lymphocytes Absolute: 2.3 10*3/uL (ref 0.7–3.1)
Lymphs: 22 %
MCH: 24.1 pg — ABNORMAL LOW (ref 26.6–33.0)
MCHC: 32.9 g/dL (ref 31.5–35.7)
MCV: 73 fL — ABNORMAL LOW (ref 79–97)
Monocytes Absolute: 0.7 10*3/uL (ref 0.1–0.9)
Monocytes: 7 %
Neutrophils Absolute: 6.9 10*3/uL (ref 1.4–7.0)
Neutrophils: 67 %
Platelets: 411 10*3/uL (ref 150–450)
RBC: 4.15 x10E6/uL (ref 4.14–5.80)
RDW: 14 % (ref 11.6–15.4)
WBC: 10.1 10*3/uL (ref 3.4–10.8)

## 2020-10-22 LAB — LIPID PANEL
Chol/HDL Ratio: 4.7 ratio (ref 0.0–5.0)
Cholesterol, Total: 168 mg/dL (ref 100–199)
HDL: 36 mg/dL — ABNORMAL LOW (ref >39)
LDL Chol Calc (NIH): 113 mg/dL — ABNORMAL HIGH (ref 0–99)
Triglycerides: 105 mg/dL (ref 0–149)
VLDL Cholesterol Cal: 19 mg/dL (ref 5–40)

## 2020-10-22 LAB — COMPREHENSIVE METABOLIC PANEL
ALT: 13 IU/L (ref 0–44)
AST: 17 IU/L (ref 0–40)
Albumin/Globulin Ratio: 0.6 — ABNORMAL LOW (ref 1.2–2.2)
Albumin: 2.9 g/dL — ABNORMAL LOW (ref 3.8–4.9)
Alkaline Phosphatase: 98 IU/L (ref 44–121)
BUN/Creatinine Ratio: 16 (ref 9–20)
BUN: 13 mg/dL (ref 6–24)
Bilirubin Total: <0.2 mg/dL (ref 0.0–1.2)
CO2: 26 mmol/L (ref 20–29)
Calcium: 9.1 mg/dL (ref 8.7–10.2)
Chloride: 98 mmol/L (ref 96–106)
Creatinine, Ser: 0.82 mg/dL (ref 0.76–1.27)
Globulin, Total: 4.5 g/dL (ref 1.5–4.5)
Glucose: 131 mg/dL — ABNORMAL HIGH (ref 65–99)
Potassium: 4.3 mmol/L (ref 3.5–5.2)
Sodium: 141 mmol/L (ref 134–144)
Total Protein: 7.4 g/dL (ref 6.0–8.5)
eGFR: 104 mL/min/{1.73_m2} (ref >59)

## 2020-10-22 LAB — IRON,TIBC AND FERRITIN PANEL
Ferritin: 933 ng/mL — ABNORMAL HIGH (ref 30–400)
Iron Saturation: 10 % — ABNORMAL LOW (ref 15–55)
Iron: 20 ug/dL — ABNORMAL LOW (ref 38–169)
Total Iron Binding Capacity: 198 ug/dL — ABNORMAL LOW (ref 250–450)
UIBC: 178 ug/dL (ref 111–343)

## 2020-10-22 LAB — HCV AB W REFLEX TO QUANT PCR: HCV Ab: <0.1 s/co ratio (ref 0.0–0.9)

## 2020-10-22 LAB — MAGNESIUM: Magnesium: 1.7 mg/dL (ref 1.6–2.3)

## 2020-10-22 LAB — HCV INTERPRETATION

## 2020-10-22 LAB — PHOSPHORUS: Phosphorus: 3.3 mg/dL (ref 2.8–4.1)

## 2020-10-22 NOTE — Telephone Encounter (Signed)
Contacted pt to go over lab results pt is aware and doesn't have any questions or concerns 

## 2020-10-22 NOTE — Telephone Encounter (Signed)
Per MD called [facility] with orders to pull picc. Spoke with Amy at the pharmacy who will relay orders to nursing. Orders were repeated and confirmed. Patient is aware home health will call to set up picc removal.
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
## 2020-10-23 ENCOUNTER — Other Ambulatory Visit: Payer: Self-pay

## 2020-10-23 ENCOUNTER — Telehealth: Payer: Self-pay

## 2020-10-23 NOTE — Telephone Encounter (Signed)
I made an error when i changed his rx. It should be 34 units TWICE A DAY  Let the rn know and thank her for catching my error

## 2020-10-23 NOTE — Telephone Encounter (Signed)
Needing clarification On Basaglar insulin. AVS sates 2 times daily and Script says 1 daily.  Glenard Haring can be reached at [place].

## 2020-10-23 NOTE — Telephone Encounter (Signed)
Glenard Haring was called and made aware of how patient should be taking medication.

## 2020-10-28 ENCOUNTER — Other Ambulatory Visit: Payer: Self-pay

## 2020-10-29 ENCOUNTER — Telehealth: Payer: Self-pay

## 2020-10-29 NOTE — Telephone Encounter (Signed)
Okay very good

## 2020-10-29 NOTE — Telephone Encounter (Signed)
If there is something in my schedule we can do this otherwise needs to see one of my partners or Nps

## 2020-10-29 NOTE — Telephone Encounter (Signed)
This was offered as well, patient wanted to only see you. He is scheduled for 4/29 and agreed with that.

## 2020-10-29 NOTE — Telephone Encounter (Signed)
Received call from physical therapy them stating patient complains of increased back pain, pain to lower extremities with chest pain at times.   Therapy team requesting sooner appointment. Advised therapist if chest pain persists or fails to get better to seek care at ED. Per  Therapist patient did seem to be in any distress, no shortness of breath, wheezing, observed and O2 stats currently at 98%.   Provided patient with sooner appointment with MD as requested.  Riley Hood

## 2020-11-03 NOTE — Progress Notes (Signed)
S:     PCP: Dr. Joya Gaskins PMH: HTN, DM, HLD  Patient arrives in good spirits in a wheelchair accompanied with a family member. Presents for diabetes evaluation, education, and management.Patient was referred and last seen by Primary Care Provider on 10/21/20. During DKA ED admission in March 2022, pt reported difficulty affording insulins as he is uninsured and was receiving samples from prior PCP. On discharge, he was transitioned from Lantus to Novolog 70/30 30 units BID and SSI meal time insulin. At PCP visit on 4/6, patient was switched to Basaglar 34 units twice daily and continued on Novolog 5 units TID w/meals with SSI.   Today, patient reports medication adherence with Basaglar 34 units twice daily and Novolog 5 units TID w/meals + SSI. Reports averaging 7 units of Novolog with meals. Reports polyuria, polydipsia, and neuropathy in the right hand. Reports blurry vision for the last 3 weeks, but plans to set an appointment with the eye doctor soon. Reports home health nurse visits him every Monday and checks his sugars which she reports is "good." Denies symptomatic hypoglycemia. Discussed diet and exercise regimen below.  Family/Social History:  -Fhx: none listed -Tobacco use: denies   Insurance coverage/medication affordability: dnbi  Medication adherence reported good.     Current diabetes medications include: Basaglar 34 units twice daily, Novolog 5 units TID w/meals + SSI Current hypertension medications include: metoprolol tartrate 50 mg BID Current hyperlipidemia medications include: atorvastatin 40 mg daily  Patient denies hypoglycemic events.  Patient reported dietary habits: Lunch/dinner: Wheat bread, sandwich meats ([place], ham, chicken), limits red meat, vegetables, Snacks: fruits (grapes, bananas, strawberries, pineapples) Drinks: water and gatorade zero  Patient-reported exercise habits: limited due to being in a wheelchair   Patient reports nocturia (nighttime  urination).  Patient reports neuropathy (nerve pain). Patient reports visual changes.   O:  POCT: 143 (post-prandial)  Home blood sugars: none  Lab Results  Component Value Date   HGBA1C 13.1 (H) 09/15/2020   There were no vitals filed for this visit.  Lipid Panel     Component Value Date/Time   CHOL 168 10/21/2020 1026   TRIG 105 10/21/2020 1026   HDL 36 (L) 10/21/2020 1026   CHOLHDL 4.7 10/21/2020 1026   CHOLHDL NOT CALCULATED 09/27/2020 0121   VLDL 52 (H) 09/27/2020 0121   LDLCALC 113 (H) 10/21/2020 1026   LDLDIRECT 162.9 (H) 08/17/2019 1320   Clinical Atherosclerotic Cardiovascular Disease (ASCVD): No  The 10-year ASCVD risk score Mikey Bussing DC Jr., et al., 2013) is: 20.2%   Values used to calculate the score:     Age: 55 years     Sex: Male     Is Non-Hispanic African American: Yes     Diabetic: Yes     Tobacco smoker: No     Systolic Blood Pressure: 371 mmHg     Is BP treated: Yes     HDL Cholesterol: 36 mg/dL     Total Cholesterol: 168 mg/dL    A/P: Diabetes longstanding currently uncontrolled. Patient is able to verbalize appropriate hypoglycemia management plan. Medication adherence appears optimal. Unable to assess home CBG readings, however BG improved per patient and clinic POCT within target range. Encouraged patient to bring glucometer to next visit to assess home blood glucose readings. Patient verbalized understanding. -No medication changes today -Continued Basaglar 34 units twice daily -Continued Novolog 5 units TID w/meals + SSI -Extensively discussed pathophysiology of diabetes, recommended lifestyle interventions, dietary effects on blood sugar control -Counseled on s/sx  of and management of hypoglycemia -Next A1C anticipated 12/2020.   ASCVD risk - primary prevention in patient with diabetes. Last LDL is not controlled. ASCVD risk score is >20%  - high intensity statin indicated. -Continued atorvastatin 40 mg daily  Written patient instructions  provided.  Total time in face to face counseling 30 minutes.   Follow up Pharmacist Clinic Visit in 2 weeks.     Lorel Monaco, PharmD, [place] PGY2 Ambulatory Care Resident [place]

## 2020-11-04 ENCOUNTER — Other Ambulatory Visit: Payer: Self-pay

## 2020-11-04 ENCOUNTER — Ambulatory Visit: Payer: Self-pay | Attending: Critical Care Medicine | Admitting: Pharmacist

## 2020-11-04 DIAGNOSIS — E1169 Type 2 diabetes mellitus with other specified complication: Secondary | ICD-10-CM

## 2020-11-04 LAB — GLUCOSE, POCT (MANUAL RESULT ENTRY): POC Glucose: 143 mg/dl — AB (ref 70–99)

## 2020-11-10 ENCOUNTER — Ambulatory Visit: Payer: Self-pay | Admitting: Internal Medicine

## 2020-11-13 ENCOUNTER — Ambulatory Visit: Payer: Self-pay | Admitting: Infectious Disease

## 2020-11-17 ENCOUNTER — Other Ambulatory Visit: Payer: Self-pay | Admitting: Critical Care Medicine

## 2020-11-17 ENCOUNTER — Other Ambulatory Visit: Payer: Self-pay

## 2020-11-17 MED ORDER — METHOCARBAMOL 500 MG PO TABS
500.0000 mg | ORAL_TABLET | Freq: Three times a day (TID) | ORAL | 0 refills | Status: DC | PRN
  Filled 2020-11-17: qty 20, 7d supply, fill #0

## 2020-11-17 MED FILL — Metoprolol Tartrate Tab 50 MG: ORAL | 30 days supply | Qty: 60 | Fill #0 | Status: AC

## 2020-11-17 MED FILL — Polyethylene Glycol 3350 Oral Packet 17 GM: ORAL | 28 days supply | Qty: 14 | Fill #0 | Status: CN

## 2020-11-17 NOTE — Progress Notes (Unsigned)
S:     PCP: Dr. Joya Gaskins PMH: HTN, DM, HLD  Patient arrives in good spirits in a wheelchair accompanied with a family member. Presents for diabetes evaluation, education, and management. Patient was referred and last seen by Primary Care Provider on 10/21/20. Pt last seen by pharmacy on 11/04/20. At that visit, POCT BG was 143. No medication changes were made.  Today, patient reports ***  A1C = Check Clinic BG Review medications and adherence (timing of meds, etc.)  Ate or drank anything prior to visit today? At home BGs?  Marland Kitchen Highs . Lows  Hyperglycemia sx (nocturia, neuropathy, visual changes, foot exams) Hypoglycemia symptoms (dizziness, shaky, sweating, hungry, confusion) Diet Exercise  Plan: 1. BG readings 2. Titrate insulin vs GLP depending on sugars 3. LDL - 113 - assess adherence to atorvastatin 40 mg daily  Family/Social History:  -Fhx: none listed -Tobacco use: denies   Insurance coverage/medication affordability: dnbi  Medication adherence reported *** .   Current diabetes medications include: Basaglar 34 units twice daily, Novolog 5 units TID w/meals + SSI Current hypertension medications include: metoprolol tartrate 50 mg BID Current hyperlipidemia medications include: atorvastatin 40 mg daily  Patient {Actions; denies-reports:120008} hypoglycemic events.  Patient reported dietary habits: Lunch/dinner: Wheat bread, sandwich meats ([place], ham, chicken), limits red meat, vegetables, Snacks: fruits (grapes, bananas, strawberries, pineapples) Drinks: water and gatorade zero  Patient-reported exercise habits: limited due to being in a wheelchair   Patient {Actions; denies-reports:120008} nocturia (nighttime urination).  Patient {Actions; denies-reports:120008} neuropathy (nerve pain). Patient {Actions; denies-reports:120008} visual changes. Patient {Actions; denies-reports:120008} self foot exams.     O:  POCT:  Home fasting blood sugars: ***  2  hour post-meal/random blood sugars: ***.  Lab Results  Component Value Date   HGBA1C 13.1 (H) 09/15/2020   There were no vitals filed for this visit.  Lipid Panel     Component Value Date/Time   CHOL 168 10/21/2020 1026   TRIG 105 10/21/2020 1026   HDL 36 (L) 10/21/2020 1026   CHOLHDL 4.7 10/21/2020 1026   CHOLHDL NOT CALCULATED 09/27/2020 0121   VLDL 52 (H) 09/27/2020 0121   LDLCALC 113 (H) 10/21/2020 1026   LDLDIRECT 162.9 (H) 08/17/2019 1320     Clinical Atherosclerotic Cardiovascular Disease (ASCVD): No  The 10-year ASCVD risk score Mikey Bussing DC Jr., et al., 2013) is: 20.2%   Values used to calculate the score:     Age: 55 years     Sex: Male     Is Non-Hispanic African American: Yes     Diabetic: Yes     Tobacco smoker: No     Systolic Blood Pressure: 254 mmHg     Is BP treated: Yes     HDL Cholesterol: 36 mg/dL     Total Cholesterol: 168 mg/dL    A/P: Diabetes longstanding*** currently ***. Patient is *** able to verbalize appropriate hypoglycemia management plan. Medication adherence appears ***. Control is suboptimal due to ***. - -Extensively discussed pathophysiology of diabetes, recommended lifestyle interventions, dietary effects on blood sugar control -Counseled on s/sx of and management of hypoglycemia -Next A1C anticipated 12/2020.   ASCVD risk - primary prevention in patient with diabetes. Last LDL is not controlled. ASCVD risk score is >20%  - high intensity statin indicated. -Continued atorvastatin 40 mg daily  Written patient instructions provided.  Total time in face to face counseling *** minutes.   Follow up Pharmacist/PCP*** Clinic Visit in ***.   Patient seen with ***  Arlett Goold,  PharmD, [place] PGY2 Ambulatory Care Resident [place]

## 2020-11-17 NOTE — Telephone Encounter (Signed)
Requested medication (s) are due for refill today:yes  Requested medication (s) are on the active medication list: yes  Last refill: 10/21/20   #20  0 refills  Future visit scheduled yes  11/18/20 with pharmacist.  12/10/20 with Dr. Joya Gaskins  Notes to clinic: not delegated  Requested Prescriptions  Pending Prescriptions Disp Refills   methocarbamol (ROBAXIN) 500 MG tablet 20 tablet 0    Sig: Take 1 tablet (500 mg total) by mouth every 8 (eight) hours as needed for muscle spasms.      Not Delegated - Analgesics:  Muscle Relaxants Failed - 11/17/2020  4:09 PM      Failed - This refill cannot be delegated      Passed - Valid encounter within last 6 months    Recent Outpatient Visits           1 week ago Type 2 diabetes mellitus with other specified complication, unspecified whether long term insulin use ([place])   [place], Jarome Matin, RPH-CPP   3 weeks ago Type 2 diabetes mellitus with other specified complication, unspecified whether long term insulin use ([place])   [place] Elsie Stain, MD   5 years ago Essential hypertension   [place], Enobong, MD       Future Appointments             Tomorrow [name], Jarome Matin, [place]   In 3 weeks Joya Gaskins Burnett Harry, MD [place]

## 2020-11-18 ENCOUNTER — Other Ambulatory Visit: Payer: Self-pay

## 2020-11-18 ENCOUNTER — Ambulatory Visit: Payer: Self-pay | Admitting: Pharmacist

## 2020-11-19 ENCOUNTER — Other Ambulatory Visit: Payer: Self-pay

## 2020-11-20 ENCOUNTER — Ambulatory Visit: Payer: Self-pay | Admitting: Infectious Disease

## 2020-12-10 ENCOUNTER — Ambulatory Visit: Payer: Self-pay | Attending: Critical Care Medicine | Admitting: Nurse Practitioner

## 2020-12-10 ENCOUNTER — Other Ambulatory Visit: Payer: Self-pay

## 2020-12-10 NOTE — Progress Notes (Deleted)
Virtual Visit via Telephone Note  I connected with Riley Hood on 12/10/20 at  8:30 AM EDT by telephone and verified that I am speaking with the correct person using two identifiers.  Location: Patient: home Provider: Office   I discussed the limitations, risks, security and privacy concerns of performing an evaluation and management service by telephone and the availability of in person appointments. I also discussed with the patient that there may be a patient responsible charge related to this service. The patient expressed understanding and agreed to proceed.   History of Present Illness:  Patient has a televisit today for    Type 2 diabetes mellitus with complication: Type 2 diabetes with complications including neuropathy and associated calluses on the feet and associated renal insufficiency   Per recent visit with Riley Hood -  Diabetes longstanding currently uncontrolled. Patient is able to verbalize appropriate hypoglycemia management plan. Medication adherence appears optimal. Unable to assess home CBG readings, however BG improved per patient and clinic POCT within target range. Encouraged patient to bring glucometer to next visit to assess home blood glucose readings. Patient verbalized understanding.  -Continued Basaglar 34 units twice daily -Continued Novolog 5 units TID w/meals + SSI -Extensively discussed pathophysiology of diabetes, recommended lifestyle interventions, dietary effects on blood sugar control -Counseled on s/sx of and management of hypoglycemia -Next A1C anticipated 12/2020.   Peripheral neuropathy Continue gabapentin  Hypertension Blood pressure under good control at this visit Continue metoprolol   Acute left-sided low back pain with left-sided sciatica Patient is being followed by ID. Lumbar 2 3 discitis seen on MRI possible seeding from E. Coli. Will continue IV antibiotics and follow-up with infectious disease as directed.    Obesity Morbid obesity given dietary measures  Hyperlipidemia Associated with diabetes continue statin therapy follow-up lipid panel ASCVD risk - primary prevention in patient with diabetes. Last LDL is not controlled. ASCVD risk score is >20%  - high intensity statin indicated. -Continued atorvastatin 40 mg daily  Bacteremia due to Escherichia coli Bacteremia with E. coli unknown source possible L2-3 seeding and disc  Continue IV antibiotics follow-up with infectious disease  Nonrheumatic aortic valve stenosis Follow-up with cardiology   Hypomagnesemia Multiple electrolyte abnormalities including hypomagnesemia hypophosphatemia seen during hospitalization will follow up to see if been repleted  Hypophosphatemia As per magnesium assessment  Iron deficiency Needed intravenous iron infusion continue oral iron and follow-up iron studies   Riley Hood was seen today for hospitalization follow-up.  Diagnoses and all orders for this visit:  Type 2 diabetes mellitus with other specified complication, unspecified whether long term insulin use (HCC) -     POCT glucose (manual entry) -     Comprehensive metabolic panel -     Lipid panel -     Microalbumin / creatinine urine ratio  Need for hepatitis C screening test -     HCV Ab w Reflex to Quant PCR  Hypomagnesemia -     Magnesium  Colon cancer screening -     Fecal occult blood, imunochemical  Hypophosphatemia -     Phosphorus  Iron deficiency -     Iron, TIBC and Ferritin Panel  Primary hypertension -     CBC with Differential/Platelet  Bacterial infection due to E. coli -     CBC with Differential/Platelet     Observations/Objective:   Assessment and Plan:   Follow Up Instructions:    I discussed the assessment and treatment plan with the patient. The patient was provided  an opportunity to ask questions and all were answered. The patient agreed with the plan and demonstrated an  understanding of the instructions.   The patient was advised to call back or seek an in-person evaluation if the symptoms worsen or if the condition fails to improve as anticipated.  I provided *** minutes of non-face-to-face time during this encounter.   Fenton Foy, NP

## 2020-12-17 ENCOUNTER — Other Ambulatory Visit: Payer: Self-pay

## 2020-12-17 MED FILL — Insulin Aspart Inj Soln 100 Unit/ML: INTRAMUSCULAR | 28 days supply | Qty: 10 | Fill #0 | Status: AC

## 2020-12-17 MED FILL — Metoprolol Tartrate Tab 50 MG: ORAL | 30 days supply | Qty: 60 | Fill #1 | Status: AC

## 2020-12-18 ENCOUNTER — Other Ambulatory Visit: Payer: Self-pay

## 2020-12-21 ENCOUNTER — Other Ambulatory Visit: Payer: Self-pay

## 2021-01-26 ENCOUNTER — Other Ambulatory Visit: Payer: Self-pay

## 2021-01-27 ENCOUNTER — Other Ambulatory Visit: Payer: Self-pay

## 2021-01-27 MED FILL — Insulin Aspart Inj Soln 100 Unit/ML: INTRAMUSCULAR | 28 days supply | Qty: 10 | Fill #1 | Status: AC

## 2021-01-27 MED FILL — Metoprolol Tartrate Tab 50 MG: ORAL | 30 days supply | Qty: 60 | Fill #2 | Status: AC

## 2021-02-08 ENCOUNTER — Other Ambulatory Visit: Payer: Self-pay

## 2021-03-19 ENCOUNTER — Other Ambulatory Visit: Payer: Self-pay | Admitting: Critical Care Medicine

## 2021-03-19 ENCOUNTER — Other Ambulatory Visit: Payer: Self-pay

## 2021-03-19 MED FILL — Insulin Aspart Inj Soln 100 Unit/ML: INTRAMUSCULAR | 28 days supply | Qty: 10 | Fill #2 | Status: AC

## 2021-03-23 ENCOUNTER — Other Ambulatory Visit: Payer: Self-pay

## 2021-03-23 NOTE — Telephone Encounter (Signed)
Requested medication (s) are due for refill today: Yes  Requested medication (s) are on the active medication list:Yes  Last refill:  Lopressor 09/17/20  #60  3 refills        Basaglar  10/21/20  12 ml  2 refills  Future visit scheduled no   Notes to clinic:  Lopressor by historical provider.    Pt has multi no shows for F/U. Unable to reach. Please review  Requested Prescriptions  Pending Prescriptions Disp Refills   metoprolol tartrate (LOPRESSOR) 50 MG tablet 60 tablet 3    Sig: TAKE 1 TABLET (50 MG TOTAL) BY MOUTH 2 (TWO) TIMES DAILY.     Cardiovascular:  Beta Blockers Passed - 03/19/2021 12:09 PM      Passed - Last BP in normal range    BP Readings from Last 1 Encounters:  10/21/20 126/88          Passed - Last Heart Rate in normal range    Pulse Readings from Last 1 Encounters:  10/21/20 99          Passed - Valid encounter within last 6 months    Recent Outpatient Visits           3 months ago    [place]   4 months ago Type 2 diabetes mellitus with other specified complication, unspecified whether long term insulin use ([place])   [place], Annie Main L, RPH-CPP   5 months ago Type 2 diabetes mellitus with other specified complication, unspecified whether long term insulin use ([place])   [place] Elsie Stain, MD   5 years ago Essential hypertension   [place], [place], MD               Insulin Glargine (BASAGLAR KWIKPEN) 100 UNIT/ML 12 mL 2    Sig: Inject 34 Units into the skin daily.     Endocrinology:  Diabetes - Insulins Failed - 03/19/2021 12:09 PM      Failed - HBA1C is between 0 and 7.9 and within 180 days    Hgb A1c MFr Bld  Date Value Ref Range Status  09/15/2020 13.1 (H) 4.8 - 5.6 % Final    Comment:    REPEATED TO VERIFY (NOTE) Pre diabetes:          5.7%-6.4%  Diabetes:              >6.4%  Glycemic  control for   <7.0% adults with diabetes           Passed - Valid encounter within last 6 months    Recent Outpatient Visits           3 months ago    [place]   4 months ago Type 2 diabetes mellitus with other specified complication, unspecified whether long term insulin use ([place])   [place], Annie Main L, RPH-CPP   5 months ago Type 2 diabetes mellitus with other specified complication, unspecified whether long term insulin use ([place])   [facility] Elsie Stain, MD   5 years ago Essential hypertension   [facility] Charlott Rakes, MD

## 2021-03-31 ENCOUNTER — Other Ambulatory Visit: Payer: Self-pay

## 2021-04-12 NOTE — Progress Notes (Signed)
error    This encounter was created in error - please disregard.
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
## 2021-05-24 ENCOUNTER — Other Ambulatory Visit: Payer: Self-pay

## 2021-05-24 ENCOUNTER — Other Ambulatory Visit: Payer: Self-pay | Admitting: Critical Care Medicine

## 2021-05-24 MED ORDER — BASAGLAR KWIKPEN 100 UNIT/ML ~~LOC~~ SOPN
34.0000 [IU] | PEN_INJECTOR | Freq: Every day | SUBCUTANEOUS | 0 refills | Status: DC
  Filled 2021-05-24: qty 12, 35d supply, fill #0

## 2021-05-24 MED FILL — Insulin Aspart Inj Soln 100 Unit/ML: INTRAMUSCULAR | 28 days supply | Qty: 10 | Fill #3 | Status: AC

## 2021-05-24 NOTE — Telephone Encounter (Signed)
Latest A1c 09/15/20  LOV 11/04/20. TC to patient and scheduled future OV for 06/03/21. Approved per protocol with scheduled appointment. Requested Prescriptions  Pending Prescriptions Disp Refills  . Insulin Glargine (BASAGLAR KWIKPEN) 100 UNIT/ML 12 mL 0    Sig: Inject 34 Units into the skin daily.     Endocrinology:  Diabetes - Insulins Failed - 05/24/2021 12:53 PM      Failed - HBA1C is between 0 and 7.9 and within 180 days    Hgb A1c MFr Bld  Date Value Ref Range Status  09/15/2020 13.1 (H) 4.8 - 5.6 % Final    Comment:    REPEATED TO VERIFY (NOTE) Pre diabetes:          5.7%-6.4%  Diabetes:              >6.4%  Glycemic control for   <7.0% adults with diabetes          Failed - Valid encounter within last 6 months    Recent Outpatient Visits          5 months ago [place]   6 months ago Type 2 diabetes mellitus with other specified complication, unspecified whether long term insulin use ([place])   [place], Annie Main L, RPH-CPP   7 months ago Type 2 diabetes mellitus with other specified complication, unspecified whether long term insulin use ([place])   [place] Elsie Stain, MD   5 years ago Essential hypertension   [place], Enobong, MD      Future Appointments            In 1 week Joya Gaskins Burnett Harry, MD [place]

## 2021-05-25 ENCOUNTER — Other Ambulatory Visit: Payer: Self-pay

## 2021-06-03 ENCOUNTER — Ambulatory Visit: Payer: Self-pay | Admitting: Critical Care Medicine
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
## 2021-06-18 ENCOUNTER — Other Ambulatory Visit: Payer: Self-pay

## 2021-06-18 ENCOUNTER — Emergency Department (HOSPITAL_COMMUNITY): Payer: Self-pay

## 2021-06-18 ENCOUNTER — Inpatient Hospital Stay (HOSPITAL_COMMUNITY)
Admission: EM | Admit: 2021-06-18 | Discharge: 2021-06-21 | DRG: 194 | Disposition: A | Discharge status: Home / Self Care | Payer: Self-pay | Attending: Internal Medicine | Admitting: Internal Medicine

## 2021-06-18 ENCOUNTER — Encounter (HOSPITAL_COMMUNITY): Payer: Self-pay | Admitting: Internal Medicine

## 2021-06-18 DIAGNOSIS — Z794 Long term (current) use of insulin: Secondary | ICD-10-CM

## 2021-06-18 DIAGNOSIS — Z20822 Contact with and (suspected) exposure to covid-19: Secondary | ICD-10-CM | POA: Diagnosis present

## 2021-06-18 DIAGNOSIS — Z6841 Body Mass Index (BMI) 40.0 and over, adult: Secondary | ICD-10-CM

## 2021-06-18 DIAGNOSIS — E118 Type 2 diabetes mellitus with unspecified complications: Secondary | ICD-10-CM | POA: Diagnosis present

## 2021-06-18 DIAGNOSIS — F10920 Alcohol use, unspecified with intoxication, uncomplicated: Secondary | ICD-10-CM

## 2021-06-18 DIAGNOSIS — J441 Chronic obstructive pulmonary disease with (acute) exacerbation: Principal | ICD-10-CM

## 2021-06-18 DIAGNOSIS — I10 Essential (primary) hypertension: Secondary | ICD-10-CM | POA: Diagnosis present

## 2021-06-18 DIAGNOSIS — R778 Other specified abnormalities of plasma proteins: Secondary | ICD-10-CM | POA: Diagnosis present

## 2021-06-18 DIAGNOSIS — R5381 Other malaise: Secondary | ICD-10-CM | POA: Diagnosis present

## 2021-06-18 DIAGNOSIS — I248 Other forms of acute ischemic heart disease: Secondary | ICD-10-CM | POA: Diagnosis present

## 2021-06-18 DIAGNOSIS — E1165 Type 2 diabetes mellitus with hyperglycemia: Secondary | ICD-10-CM | POA: Diagnosis present

## 2021-06-18 DIAGNOSIS — Z7982 Long term (current) use of aspirin: Secondary | ICD-10-CM

## 2021-06-18 DIAGNOSIS — M5442 Lumbago with sciatica, left side: Secondary | ICD-10-CM | POA: Diagnosis present

## 2021-06-18 DIAGNOSIS — Z79899 Other long term (current) drug therapy: Secondary | ICD-10-CM

## 2021-06-18 DIAGNOSIS — E785 Hyperlipidemia, unspecified: Secondary | ICD-10-CM | POA: Diagnosis present

## 2021-06-18 DIAGNOSIS — F10120 Alcohol abuse with intoxication, uncomplicated: Secondary | ICD-10-CM | POA: Diagnosis present

## 2021-06-18 DIAGNOSIS — J101 Influenza due to other identified influenza virus with other respiratory manifestations: Principal | ICD-10-CM

## 2021-06-18 DIAGNOSIS — Y908 Blood alcohol level of 240 mg/100 ml or more: Secondary | ICD-10-CM | POA: Diagnosis present

## 2021-06-18 DIAGNOSIS — F10129 Alcohol abuse with intoxication, unspecified: Secondary | ICD-10-CM | POA: Diagnosis present

## 2021-06-18 LAB — CBC WITH DIFFERENTIAL/PLATELET
Abs Immature Granulocytes: 0.01 10*3/uL (ref 0.00–0.07)
Basophils Absolute: 0 10*3/uL (ref 0.0–0.1)
Basophils Relative: 1 %
Eosinophils Absolute: 0 10*3/uL (ref 0.0–0.5)
Eosinophils Relative: 1 %
HCT: 40.5 % (ref 39.0–52.0)
Hemoglobin: 13.6 g/dL (ref 13.0–17.0)
Immature Granulocytes: 0 %
Lymphocytes Relative: 61 %
Lymphs Abs: 3.8 10*3/uL (ref 0.7–4.0)
MCH: 25 pg — ABNORMAL LOW (ref 26.0–34.0)
MCHC: 33.6 g/dL (ref 30.0–36.0)
MCV: 74.6 fL — ABNORMAL LOW (ref 80.0–100.0)
Monocytes Absolute: 0.5 10*3/uL (ref 0.1–1.0)
Monocytes Relative: 7 %
Neutro Abs: 1.9 10*3/uL (ref 1.7–7.7)
Neutrophils Relative %: 30 %
Platelets: 200 10*3/uL (ref 150–400)
RBC: 5.43 MIL/uL (ref 4.22–5.81)
RDW: 18.3 % — ABNORMAL HIGH (ref 11.5–15.5)
WBC: 6.2 10*3/uL (ref 4.0–10.5)
nRBC: 0 % (ref 0.0–0.2)

## 2021-06-18 LAB — COMPREHENSIVE METABOLIC PANEL
ALT: 76 U/L — ABNORMAL HIGH (ref 0–44)
AST: 90 U/L — ABNORMAL HIGH (ref 15–41)
Albumin: 3.5 g/dL (ref 3.5–5.0)
Alkaline Phosphatase: 76 U/L (ref 38–126)
Anion gap: 9 (ref 5–15)
BUN: 11 mg/dL (ref 6–20)
CO2: 24 mmol/L (ref 22–32)
Calcium: 8.4 mg/dL — ABNORMAL LOW (ref 8.9–10.3)
Chloride: 107 mmol/L (ref 98–111)
Creatinine, Ser: 0.8 mg/dL (ref 0.61–1.24)
GFR, Estimated: >60 mL/min (ref >60)
Glucose, Bld: 162 mg/dL — ABNORMAL HIGH (ref 70–99)
Potassium: 3.8 mmol/L (ref 3.5–5.1)
Sodium: 140 mmol/L (ref 135–145)
Total Bilirubin: 0.2 mg/dL — ABNORMAL LOW (ref 0.3–1.2)
Total Protein: 8 g/dL (ref 6.5–8.1)

## 2021-06-18 LAB — GLUCOSE, CAPILLARY: Glucose-Capillary: 276 mg/dL — ABNORMAL HIGH (ref 70–99)

## 2021-06-18 LAB — RESP PANEL BY RT-PCR (FLU A&B, COVID) ARPGX2
Influenza A by PCR: POSITIVE — AB
Influenza B by PCR: NEGATIVE
SARS Coronavirus 2 by RT PCR: NEGATIVE

## 2021-06-18 LAB — CBG MONITORING, ED: Glucose-Capillary: 146 mg/dL — ABNORMAL HIGH (ref 70–99)

## 2021-06-18 LAB — TROPONIN I (HIGH SENSITIVITY)
Troponin I (High Sensitivity): 22 ng/L — ABNORMAL HIGH (ref <18)
Troponin I (High Sensitivity): 16 ng/L (ref <18)

## 2021-06-18 LAB — PHOSPHORUS: Phosphorus: 1.6 mg/dL — ABNORMAL LOW (ref 2.5–4.6)

## 2021-06-18 LAB — ETHANOL: Alcohol, Ethyl (B): 368 mg/dL (ref <10)

## 2021-06-18 LAB — MAGNESIUM: Magnesium: 1.9 mg/dL (ref 1.7–2.4)

## 2021-06-18 MED ORDER — POTASSIUM CHLORIDE CRYS ER 20 MEQ PO TBCR
20.0000 meq | EXTENDED_RELEASE_TABLET | Freq: Once | ORAL | Status: AC
  Administered 2021-06-18: 20 meq via ORAL
  Filled 2021-06-18: qty 1

## 2021-06-18 MED ORDER — LACTATED RINGERS IV SOLN: INTRAVENOUS | Status: AC

## 2021-06-18 MED ORDER — INSULIN ASPART 100 UNIT/ML IJ SOLN
0.0000 [IU] | Freq: Three times a day (TID) | INTRAMUSCULAR | Status: DC
  Administered 2021-06-19: 8 [IU] via SUBCUTANEOUS
  Administered 2021-06-19 – 2021-06-20 (×3): 3 [IU] via SUBCUTANEOUS
  Administered 2021-06-20: 08:00:00 2 [IU] via SUBCUTANEOUS
  Administered 2021-06-20: 17:00:00 8 [IU] via SUBCUTANEOUS
  Administered 2021-06-21 (×2): 3 [IU] via SUBCUTANEOUS
  Filled 2021-06-18: qty 0.15

## 2021-06-18 MED ORDER — ACETAMINOPHEN 325 MG PO TABS
650.0000 mg | ORAL_TABLET | Freq: Four times a day (QID) | ORAL | Status: DC | PRN
  Administered 2021-06-21 (×2): 650 mg via ORAL
  Filled 2021-06-18 (×2): qty 2

## 2021-06-18 MED ORDER — METOPROLOL TARTRATE 50 MG PO TABS
50.0000 mg | ORAL_TABLET | Freq: Two times a day (BID) | ORAL | Status: DC
  Administered 2021-06-18 – 2021-06-21 (×6): 50 mg via ORAL
  Filled 2021-06-18 (×6): qty 1

## 2021-06-18 MED ORDER — ACETAMINOPHEN 650 MG RE SUPP: 650.0000 mg | Freq: Four times a day (QID) | RECTAL | Status: DC | PRN

## 2021-06-18 MED ORDER — ALBUTEROL SULFATE (2.5 MG/3ML) 0.083% IN NEBU
10.0000 mg | INHALATION_SOLUTION | Freq: Once | RESPIRATORY_TRACT | Status: AC
  Administered 2021-06-18: 10 mg via RESPIRATORY_TRACT
  Filled 2021-06-18: qty 12

## 2021-06-18 MED ORDER — METHYLPREDNISOLONE SODIUM SUCC 40 MG IJ SOLR
40.0000 mg | Freq: Every day | INTRAMUSCULAR | Status: AC
  Administered 2021-06-19 – 2021-06-20 (×2): 40 mg via INTRAVENOUS
  Filled 2021-06-18 (×2): qty 1

## 2021-06-18 MED ORDER — METOPROLOL TARTRATE 5 MG/5ML IV SOLN
5.0000 mg | Freq: Once | INTRAVENOUS | Status: AC
  Administered 2021-06-18: 5 mg via INTRAVENOUS
  Filled 2021-06-18: qty 5

## 2021-06-18 MED ORDER — MAGNESIUM SULFATE 2 GM/50ML IV SOLN
2.0000 g | Freq: Once | INTRAVENOUS | Status: AC
  Administered 2021-06-18: 2 g via INTRAVENOUS
  Filled 2021-06-18: qty 50

## 2021-06-18 MED ORDER — ONDANSETRON HCL 4 MG PO TABS: 4.0000 mg | ORAL_TABLET | Freq: Four times a day (QID) | ORAL | Status: DC | PRN

## 2021-06-18 MED ORDER — ONDANSETRON HCL 4 MG/2ML IJ SOLN
4.0000 mg | Freq: Four times a day (QID) | INTRAMUSCULAR | Status: DC | PRN
  Administered 2021-06-18: 4 mg via INTRAVENOUS
  Filled 2021-06-18: qty 2

## 2021-06-18 MED ORDER — METHYLPREDNISOLONE SODIUM SUCC 125 MG IJ SOLR
125.0000 mg | Freq: Once | INTRAMUSCULAR | Status: AC
  Administered 2021-06-18: 125 mg via INTRAVENOUS
  Filled 2021-06-18: qty 2

## 2021-06-18 MED ORDER — ASPIRIN EC 81 MG PO TBEC
81.0000 mg | DELAYED_RELEASE_TABLET | Freq: Every day | ORAL | Status: DC
  Administered 2021-06-18 – 2021-06-21 (×4): 81 mg via ORAL
  Filled 2021-06-18 (×4): qty 1

## 2021-06-18 MED ORDER — OSELTAMIVIR PHOSPHATE 75 MG PO CAPS
75.0000 mg | ORAL_CAPSULE | Freq: Two times a day (BID) | ORAL | Status: DC
  Administered 2021-06-18 – 2021-06-21 (×6): 75 mg via ORAL
  Filled 2021-06-18 (×6): qty 1

## 2021-06-18 MED ORDER — LACTATED RINGERS IV BOLUS
2000.0000 mL | Freq: Once | INTRAVENOUS | Status: AC
  Administered 2021-06-18: 2000 mL via INTRAVENOUS

## 2021-06-18 MED ORDER — ENALAPRILAT 1.25 MG/ML IV SOLN
0.6250 mg | Freq: Once | INTRAVENOUS | Status: AC
  Administered 2021-06-18: 0.625 mg via INTRAVENOUS
  Filled 2021-06-18: qty 0.5

## 2021-06-18 MED ORDER — ALBUTEROL (5 MG/ML) CONTINUOUS INHALATION SOLN: 10.0000 mg/h | INHALATION_SOLUTION | RESPIRATORY_TRACT | Status: DC

## 2021-06-18 NOTE — ED Notes (Signed)
Patient's bed currently being cleaned

## 2021-06-18 NOTE — ED Notes (Signed)
CRITICAL VALUE STICKER  CRITICAL VALUE: ETOH [id number]  RECEIVER (on-site recipient of call):Riley Hood  DATE & TIME NOTIFIED: 06/18/21 1401  MESSENGER (representative from lab):Lab  MD NOTIFIED: [place]  RESPONSE:
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
## 2021-06-18 NOTE — ED Provider Notes (Signed)
Emergency Medicine Provider Triage Evaluation Note  Riley Hood , a 55 y.o. male  was evaluated in triage.  Pt complains of shortness of breath.  Pt ask if I know why he is here.  Pt admits to etoh  Review of Systems  Positive: Short of breath cough Negative: No fever  Physical Exam  BP 140/85 (BP Location: Left Arm)   Pulse (!) 121   Temp 99.1 F (37.3 C) (Oral)   Resp 20   SpO2 100%  Gen:   Awake, no distress   Resp:  Normal effort  MSK:   Moves extremities without difficulty  Other:    Medical Decision Making  Medically screening exam initiated at 12:30 PM.  Appropriate orders placed.  Riley Hood was informed that the remainder of the evaluation will be completed by another provider, this initial triage assessment does not replace that evaluation, and the importance of remaining in the ED until their evaluation is complete.     [facility] Hood 06/18/21 1232    Isla Pence, MD 06/18/21 1319
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
## 2021-06-18 NOTE — Progress Notes (Signed)
Bipap not needed at this time. °
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
## 2021-06-19 LAB — GLUCOSE, CAPILLARY
Glucose-Capillary: 180 mg/dL — ABNORMAL HIGH (ref 70–99)
Glucose-Capillary: 156 mg/dL — ABNORMAL HIGH (ref 70–99)
Glucose-Capillary: 254 mg/dL — ABNORMAL HIGH (ref 70–99)
Glucose-Capillary: 250 mg/dL — ABNORMAL HIGH (ref 70–99)

## 2021-06-19 LAB — HEMOGLOBIN A1C
Hgb A1c MFr Bld: 6.3 % — ABNORMAL HIGH (ref 4.8–5.6)
Mean Plasma Glucose: 134.11 mg/dL

## 2021-06-19 MED ORDER — FOLIC ACID 1 MG PO TABS
1.0000 mg | ORAL_TABLET | Freq: Every day | ORAL | Status: DC
  Administered 2021-06-19 – 2021-06-21 (×3): 1 mg via ORAL
  Filled 2021-06-19 (×3): qty 1

## 2021-06-19 MED ORDER — ALUM & MAG HYDROXIDE-SIMETH 200-200-20 MG/5ML PO SUSP
30.0000 mL | ORAL | Status: DC | PRN
  Administered 2021-06-19: 30 mL via ORAL
  Filled 2021-06-19: qty 30

## 2021-06-19 MED ORDER — ATORVASTATIN CALCIUM 40 MG PO TABS
40.0000 mg | ORAL_TABLET | Freq: Every day | ORAL | Status: DC
  Administered 2021-06-19 – 2021-06-21 (×3): 40 mg via ORAL
  Filled 2021-06-19 (×3): qty 1

## 2021-06-19 MED ORDER — AMLODIPINE BESYLATE 5 MG PO TABS
5.0000 mg | ORAL_TABLET | Freq: Every day | ORAL | Status: DC
  Administered 2021-06-19 – 2021-06-21 (×3): 5 mg via ORAL
  Filled 2021-06-19 (×3): qty 1

## 2021-06-19 MED ORDER — LEVALBUTEROL HCL 0.63 MG/3ML IN NEBU
0.6300 mg | INHALATION_SOLUTION | Freq: Three times a day (TID) | RESPIRATORY_TRACT | Status: DC | PRN
  Administered 2021-06-19: 0.63 mg via RESPIRATORY_TRACT
  Filled 2021-06-19: qty 3

## 2021-06-19 MED ORDER — METOPROLOL TARTRATE 5 MG/5ML IV SOLN
5.0000 mg | Freq: Four times a day (QID) | INTRAVENOUS | Status: DC | PRN
  Administered 2021-06-19: 5 mg via INTRAVENOUS
  Filled 2021-06-19: qty 5

## 2021-06-19 MED ORDER — THIAMINE HCL 100 MG PO TABS
100.0000 mg | ORAL_TABLET | Freq: Every day | ORAL | Status: DC
  Administered 2021-06-19 – 2021-06-21 (×3): 100 mg via ORAL
  Filled 2021-06-19 (×3): qty 1

## 2021-06-19 MED ORDER — GUAIFENESIN-DM 100-10 MG/5ML PO SYRP
5.0000 mL | ORAL_SOLUTION | ORAL | Status: DC | PRN
  Administered 2021-06-19 (×2): 5 mL via ORAL
  Filled 2021-06-19 (×3): qty 10

## 2021-06-19 MED ORDER — K PHOS MONO-SOD PHOS DI & MONO 155-852-130 MG PO TABS
500.0000 mg | ORAL_TABLET | Freq: Every day | ORAL | Status: AC
  Administered 2021-06-19 – 2021-06-20 (×2): 500 mg via ORAL
  Filled 2021-06-19 (×2): qty 2

## 2021-06-19 MED ORDER — ADULT MULTIVITAMIN W/MINERALS CH
1.0000 | ORAL_TABLET | Freq: Every day | ORAL | Status: DC
  Administered 2021-06-19 – 2021-06-21 (×3): 1 via ORAL
  Filled 2021-06-19 (×3): qty 1

## 2021-06-19 NOTE — Plan of Care (Signed)
Pt reports feeling less SOB today. Sat up in chair much of the day. Ambulated in the room with PT.  Problem: Education: Goal: Knowledge of General Education information will improve Description: Including pain rating scale, medication(s)/side effects and non-pharmacologic comfort measures Outcome: Progressing   Problem: Health Behavior/Discharge Planning: Goal: Ability to manage health-related needs will improve Outcome: Progressing   Problem: Clinical Measurements: Goal: Ability to maintain clinical measurements within normal limits will improve Outcome: Progressing Goal: Will remain free from infection Outcome: Progressing Goal: Diagnostic test results will improve Outcome: Progressing Goal: Respiratory complications will improve Outcome: Progressing Goal: Cardiovascular complication will be avoided Outcome: Progressing   Problem: Activity: Goal: Risk for activity intolerance will decrease Outcome: Progressing   Problem: Nutrition: Goal: Adequate nutrition will be maintained Outcome: Progressing   Problem: Coping: Goal: Level of anxiety will decrease Outcome: Progressing   Problem: Elimination: Goal: Will not experience complications related to bowel motility Outcome: Progressing   Problem: Safety: Goal: Ability to remain free from injury will improve Outcome: Progressing   Problem: Skin Integrity: Goal: Risk for impaired skin integrity will decrease Outcome: Progressing
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
## 2021-06-19 NOTE — Progress Notes (Signed)
   06/18/21 2130 06/18/21 2331  Assess: MEWS Score  Temp 98 F (36.7 C)  --   BP (!) 168/104 (will give scheduled BP med)  --   Pulse Rate (!) 116  --   Resp 18  --   Level of Consciousness Alert Alert  SpO2 97 %  --   O2 Device Room Air  --   Assess: MEWS Score  MEWS Temp 0 0  MEWS Systolic 0 0  MEWS Pulse 2 1  MEWS RR 0 0  MEWS LOC 0 0  MEWS Score 2 1  MEWS Score Color Yellow Green  Assess: if the MEWS score is Yellow or Red  Were vital signs taken at a resting state? Yes  --   Focused Assessment No change from prior assessment  --   Does the patient meet 2 or more of the SIRS criteria? No  --   MEWS guidelines implemented *See Row Information* Yes  --   Treat  MEWS Interventions Administered scheduled meds/treatments  --   Pain Scale 0-10  --   Pain Score 0  --   Take Vital Signs  Increase Vital Sign Frequency  Yellow: Q 2hr X 2 then Q 4hr X 2, if remains yellow, continue Q 4hrs  --   Notify: Charge Nurse/RN  Name of Charge Nurse/RN Notified Duffy Rhody, RN  --   Date Charge Nurse/RN Notified 06/18/21  --   Time Charge Nurse/RN Notified 2130  --   Document  Patient Outcome Other (Comment) (heart rate decreased)  --   Progress note created (see row info) Yes  --   Assess: SIRS CRITERIA  SIRS Temperature  0  --   SIRS Pulse 1  --   SIRS Respirations  0  --   SIRS WBC 0  --   SIRS Score Sum  1  --

## 2021-06-19 NOTE — Progress Notes (Signed)
Notified the on call provider about patient's elevated BP and heart rate. On call provider gave a one time dose of 0.625 mg Vasotec IV injection; and also, had nurse go ahead and give the patient his metoprolol early. Passed along to the dayshift nurse about patient's elevated BP throughout the shift.

## 2021-06-19 NOTE — Plan of Care (Signed)

## 2021-06-19 NOTE — Progress Notes (Signed)
PROGRESS NOTE  Riley Hood WVP:[id number] DOB: 09-Oct-1965 DOA: 06/18/2021 PCP: Elsie Stain, MD  HPI/Recap of past 24 hours: Riley Hood is a 55 y.o. male with medical history significant of type II DM, unspecified discitis, hyperlipidemia, hypertension who presented to [facility] ED due to progressively worsening dyspnea associated with wheezing and a nonproductive cough for x2 days.  Endorses rhinorrhea, nonproductive cough, headache, fatigue, malaise and body aches.  Work-up revealed influenza A infection, elevated troponin peaked at 22 and normalized, elevated liver chemistries, electrolyte abnormalities.  He was started on Tamiflu and IV steroids.  06/19/21: Patient was seen and examined at bedside.  Feels better this morning.  Breathing is improved.  Cough is persistent.  Assessment/Plan: Principal Problem:   Influenza A Active Problems:   Hypertension   Type 2 diabetes mellitus with complication (HCC)   Hyperlipidemia   Elevated troponin   Alcohol abuse with intoxication ([place])  Influenza A infection Continue Tamiflu day 2 out of 5 IV steroids Maintaining oxygen saturation greater than 92% Antitussives as needed Bronchodilators Mobilize as tolerated     Elevated troponin Likely demand ischemia. High-sensitivity troponin peaked at 22 Normalized Monitor on telemetry Continue aspirin 81 mg daily and home Lipitor.    Alcohol abuse with intoxication ([place]) Not a daily drinker per patient. No evidence of acute alcohol withdrawal  Start multivitamins, thiamine and folic acid     Hypertension, not at goal, elevated. Continue home metoprolol 50 mg p.o. twice daily. Norvasc added 5 mg daily IV antihypertensive as needed with parameters. Monitor blood pressure and heart rate.     Type 2 diabetes mellitus with complication (HCC) Hemoglobin A1c 6.3 on 06/19/2021. Carbohydrate modified diet. Continue insulin sliding scale.     Hyperlipidemia LDL 162 on  08/17/2019 Resume home Lipitor 40 mg daily Obtain lipid panel in the morning  Ambulatory dysfunction/Physical debility PT OT to assess  Fall precautions.     Code Status: Full code  Family Communication: None at bedside  Disposition Plan: Likely will discharge to home in the next 24 to 48 hours.   Consultants: None  Procedures: None none  Antimicrobials: Tamiflu  DVT prophylaxis: Subcu Lovenox daily  Status is: Inpatient  Inpatient status.  Patient requires at least 2 midnights for further evaluation and treatment of present condition.      Objective: Vitals:   06/19/21 0111 06/19/21 0139 06/19/21 0540 06/19/21 0936  BP: (!) 159/104  (!) 173/113 (!) 158/100  Pulse: (!) 104 (!) 104 (!) 102 98  Resp:  18 18 18   Temp:  98.5 F (36.9 C) 98.9 F (37.2 C) 98.3 F (36.8 C)  TempSrc:  Oral Oral Oral  SpO2:  98% 98% 99%  Weight:      Height:        Intake/Output Summary (Last 24 hours) at 06/19/2021 1543 Last data filed at 06/19/2021 1100 Gross per 24 hour  Intake 1838.46 ml  Output 1675 ml  Net 163.46 ml   Filed Weights   06/18/21 2130  Weight: 133.1 kg    Exam:  General: 55 y.o. year-old male well developed well nourished in no acute distress.  Alert and oriented x3. Cardiovascular: Regular rate and rhythm with no rubs or gallops.  No thyromegaly or JVD noted.   Respiratory: Diffuse wheezing bilaterally. Good inspiratory effort. Abdomen: Soft nontender nondistended with normal bowel sounds x4 quadrants. Musculoskeletal: No lower extremity edema. 2/4 pulses in all 4 extremities. Skin: No ulcerative lesions noted or rashes, Psychiatry: Mood is  appropriate for condition and setting   Data Reviewed: CBC: Recent Labs  Lab 06/18/21 1231  WBC 6.2  NEUTROABS 1.9  HGB 13.6  HCT 40.5  MCV 74.6*  PLT 341   Basic Metabolic Panel: Recent Labs  Lab 06/18/21 1231 06/18/21 2158  NA 140  --   K 3.8  --   CL 107  --   CO2 24  --   GLUCOSE 162*  --    BUN 11  --   CREATININE 0.80  --   CALCIUM 8.4*  --   MG 1.9  --   PHOS  --  1.6*   GFR: Estimated Creatinine Clearance: 145.2 mL/min (by C-G formula based on SCr of 0.8 mg/dL). Liver Function Tests: Recent Labs  Lab 06/18/21 1231  AST 90*  ALT 76*  ALKPHOS 76  BILITOT 0.2*  PROT 8.0  ALBUMIN 3.5   No results for input(s): LIPASE, AMYLASE in the last 168 hours. No results for input(s): AMMONIA in the last 168 hours. Coagulation Profile: No results for input(s): INR, PROTIME in the last 168 hours. Cardiac Enzymes: No results for input(s): CKTOTAL, CKMB, CKMBINDEX, TROPONINI in the last 168 hours. BNP (last 3 results) No results for input(s): PROBNP in the last 8760 hours. HbA1C: Recent Labs    06/19/21 0553  HGBA1C 6.3*   CBG: Recent Labs  Lab 06/18/21 1332 06/18/21 2157 06/19/21 0729 06/19/21 1143  GLUCAP 146* 276* 180* 156*   Lipid Profile: No results for input(s): CHOL, HDL, LDLCALC, TRIG, CHOLHDL, LDLDIRECT in the last 72 hours. Thyroid Function Tests: No results for input(s): TSH, T4TOTAL, FREET4, T3FREE, THYROIDAB in the last 72 hours. Anemia Panel: No results for input(s): VITAMINB12, FOLATE, FERRITIN, TIBC, IRON, RETICCTPCT in the last 72 hours. Urine analysis:    Component Value Date/Time   COLORURINE AMBER (A) 09/24/2020 0223   APPEARANCEUR HAZY (A) 09/24/2020 0223   LABSPEC 1.021 09/24/2020 0223   PHURINE 5.0 09/24/2020 0223   GLUCOSEU >=500 (A) 09/24/2020 0223   HGBUR MODERATE (A) 09/24/2020 0223   BILIRUBINUR NEGATIVE 09/24/2020 0223   BILIRUBINUR neg 10/07/2019 0951   KETONESUR 20 (A) 09/24/2020 0223   PROTEINUR 30 (A) 09/24/2020 0223   UROBILINOGEN 0.2 09/15/2020 1032   NITRITE NEGATIVE 09/24/2020 0223   LEUKOCYTESUR NEGATIVE 09/24/2020 0223   Sepsis Labs: @LABRCNTIP (procalcitonin:4,lacticidven:4)  ) Recent Results (from the past 240 hour(s))  Resp Panel by RT-PCR (Flu A&B, Covid) Nasopharyngeal Swab     Status: Abnormal    Collection Time: 06/18/21  1:33 PM   Specimen: Nasopharyngeal Swab; Nasopharyngeal(NP) swabs in vial transport medium  Result Value Ref Range Status   SARS Coronavirus 2 by RT PCR NEGATIVE NEGATIVE Final    Comment: (NOTE) SARS-CoV-2 target nucleic acids are NOT DETECTED.  The SARS-CoV-2 RNA is generally detectable in upper respiratory specimens during the acute phase of infection. The lowest concentration of SARS-CoV-2 viral copies this assay can detect is 138 copies/mL. A negative result does not preclude SARS-Cov-2 infection and should not be used as the sole basis for treatment or other patient management decisions. A negative result may occur with  improper specimen collection/handling, submission of specimen other than nasopharyngeal swab, presence of viral mutation(s) within the areas targeted by this assay, and inadequate number of viral copies(<138 copies/mL). A negative result must be combined with clinical observations, patient history, and epidemiological information. The expected result is Negative.  Fact Sheet for Patients:  EntrepreneurPulse.com.au  Fact Sheet for Healthcare Providers:  IncredibleEmployment.be  This test  is no t yet approved or cleared by the [place] and  has been authorized for detection and/or diagnosis of SARS-CoV-2 by FDA under an Emergency Use Authorization (EUA). This EUA will remain  in effect (meaning this test can be used) for the duration of the COVID-19 declaration under Section 564(b)(1) of the Act, 21 U.S.C.section 360bbb-3(b)(1), unless the authorization is terminated  or revoked sooner.       Influenza A by PCR POSITIVE (A) NEGATIVE Final   Influenza B by PCR NEGATIVE NEGATIVE Final    Comment: (NOTE) The Xpert Xpress SARS-CoV-2/FLU/RSV plus assay is intended as an aid in the diagnosis of influenza from Nasopharyngeal swab specimens and should not be used as a sole basis for treatment.  Nasal washings and aspirates are unacceptable for Xpert Xpress SARS-CoV-2/FLU/RSV testing.  Fact Sheet for Patients: EntrepreneurPulse.com.au  Fact Sheet for Healthcare Providers: IncredibleEmployment.be  This test is not yet approved or cleared by the [place] FDA and has been authorized for detection and/or diagnosis of SARS-CoV-2 by FDA under an Emergency Use Authorization (EUA). This EUA will remain in effect (meaning this test can be used) for the duration of the COVID-19 declaration under Section 564(b)(1) of the Act, 21 U.S.C. section 360bbb-3(b)(1), unless the authorization is terminated or revoked.  Performed at [facility], [place] [address], [place], [place] [address]       Studies: No results found.  Scheduled Meds:  aspirin EC  81 mg Oral Daily   insulin aspart  0-15 Units Subcutaneous TID WC   methylPREDNISolone (SOLU-MEDROL) injection  40 mg Intravenous Daily   metoprolol tartrate  50 mg Oral BID   oseltamivir  75 mg Oral BID   phosphorus  500 mg Oral Daily    Continuous Infusions:   LOS: 0 days     Kayleen Memos, MD [facility] Pager [phone]  If 7PM-7AM, please contact night-coverage [email or web address] Password [email or web address] 06/19/2021, 3:43 PM

## 2021-06-20 LAB — COMPREHENSIVE METABOLIC PANEL
ALT: 53 U/L — ABNORMAL HIGH (ref 0–44)
AST: 29 U/L (ref 15–41)
Albumin: 3.4 g/dL — ABNORMAL LOW (ref 3.5–5.0)
Alkaline Phosphatase: 63 U/L (ref 38–126)
Anion gap: 3 — ABNORMAL LOW (ref 5–15)
BUN: 19 mg/dL (ref 6–20)
CO2: 31 mmol/L (ref 22–32)
Calcium: 9.3 mg/dL (ref 8.9–10.3)
Chloride: 98 mmol/L (ref 98–111)
Creatinine, Ser: 0.97 mg/dL (ref 0.61–1.24)
GFR, Estimated: >60 mL/min (ref >60)
Glucose, Bld: 161 mg/dL — ABNORMAL HIGH (ref 70–99)
Potassium: 4.5 mmol/L (ref 3.5–5.1)
Sodium: 132 mmol/L — ABNORMAL LOW (ref 135–145)
Total Bilirubin: 0.7 mg/dL (ref 0.3–1.2)
Total Protein: 7.5 g/dL (ref 6.5–8.1)

## 2021-06-20 LAB — LIPID PANEL
Cholesterol: 223 mg/dL — ABNORMAL HIGH (ref 0–200)
HDL: 73 mg/dL (ref >40)
LDL Cholesterol: 130 mg/dL — ABNORMAL HIGH (ref 0–99)
Total CHOL/HDL Ratio: 3.1 RATIO
Triglycerides: 99 mg/dL (ref <150)
VLDL: 20 mg/dL (ref 0–40)

## 2021-06-20 LAB — GLUCOSE, CAPILLARY
Glucose-Capillary: 140 mg/dL — ABNORMAL HIGH (ref 70–99)
Glucose-Capillary: 198 mg/dL — ABNORMAL HIGH (ref 70–99)
Glucose-Capillary: 254 mg/dL — ABNORMAL HIGH (ref 70–99)
Glucose-Capillary: 307 mg/dL — ABNORMAL HIGH (ref 70–99)

## 2021-06-20 LAB — CBC
HCT: 39.9 % (ref 39.0–52.0)
Hemoglobin: 13.4 g/dL (ref 13.0–17.0)
MCH: 24.9 pg — ABNORMAL LOW (ref 26.0–34.0)
MCHC: 33.6 g/dL (ref 30.0–36.0)
MCV: 74.2 fL — ABNORMAL LOW (ref 80.0–100.0)
Platelets: 219 10*3/uL (ref 150–400)
RBC: 5.38 MIL/uL (ref 4.22–5.81)
RDW: 18.4 % — ABNORMAL HIGH (ref 11.5–15.5)
WBC: 9.2 10*3/uL (ref 4.0–10.5)
nRBC: 0 % (ref 0.0–0.2)

## 2021-06-20 LAB — PHOSPHORUS: Phosphorus: 2.5 mg/dL (ref 2.5–4.6)

## 2021-06-20 LAB — MAGNESIUM: Magnesium: 2 mg/dL (ref 1.7–2.4)

## 2021-06-20 MED ORDER — METHYLPREDNISOLONE SODIUM SUCC 40 MG IJ SOLR
40.0000 mg | Freq: Two times a day (BID) | INTRAMUSCULAR | Status: DC
  Administered 2021-06-20 – 2021-06-21 (×2): 40 mg via INTRAVENOUS
  Filled 2021-06-20 (×2): qty 1

## 2021-06-20 MED ORDER — BENZONATATE 100 MG PO CAPS
100.0000 mg | ORAL_CAPSULE | Freq: Three times a day (TID) | ORAL | Status: DC
  Administered 2021-06-20 – 2021-06-21 (×3): 100 mg via ORAL
  Filled 2021-06-20 (×3): qty 1

## 2021-06-20 MED ORDER — IPRATROPIUM-ALBUTEROL 0.5-2.5 (3) MG/3ML IN SOLN
3.0000 mL | Freq: Four times a day (QID) | RESPIRATORY_TRACT | Status: DC
Start: 2021-06-20 — End: 2021-06-21
  Administered 2021-06-20 – 2021-06-21 (×3): 3 mL via RESPIRATORY_TRACT
  Filled 2021-06-20 (×3): qty 3

## 2021-06-20 NOTE — Plan of Care (Signed)
  Problem: Education: Goal: Knowledge of General Education information will improve Description: Including pain rating scale, medication(s)/side effects and non-pharmacologic comfort measures Outcome: Progressing   Problem: Health Behavior/Discharge Planning: Goal: Ability to manage health-related needs will improve Outcome: Progressing   Problem: Clinical Measurements: Goal: Ability to maintain clinical measurements within normal limits will improve Outcome: Progressing Goal: Will remain free from infection Outcome: Progressing Goal: Diagnostic test results will improve Outcome: Progressing Goal: Respiratory complications will improve Outcome: Progressing Goal: Cardiovascular complication will be avoided Outcome: Progressing   Problem: Activity: Goal: Risk for activity intolerance will decrease Outcome: Progressing   Problem: Nutrition: Goal: Adequate nutrition will be maintained Outcome: Progressing   Problem: Coping: Goal: Level of anxiety will decrease Outcome: Progressing   Problem: Elimination: Goal: Will not experience complications related to bowel motility Outcome: Progressing   Problem: Safety: Goal: Ability to remain free from injury will improve Outcome: Progressing   Problem: Skin Integrity: Goal: Risk for impaired skin integrity will decrease Outcome: Progressing
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
## 2021-06-20 NOTE — Progress Notes (Signed)
SATURATION QUALIFICATIONS: (This note is used to comply with regulatory documentation for home oxygen)  Patient Saturations on Room Air at Rest = 97%  Patient Saturations on Room Air while Ambulating = 91%  Pt with some dyspnea with ambulation but he relates this to wearing a mask. Pt did become tachycardic, sustaining in the 130's with ambulation. HR settled into the low 100's when settled back in the chair.

## 2021-06-20 NOTE — Progress Notes (Addendum)
PROGRESS NOTE  Riley Hood UDJ:[id number] DOB: 07-Aug-1965 DOA: 06/18/2021 PCP: Elsie Stain, MD  HPI/Recap of past 24 hours: Riley Hood is a 55 y.o. male with medical history significant of type II DM, unspecified discitis, hyperlipidemia, hypertension who presented to [facility] ED due to progressively worsening dyspnea associated with wheezing and a nonproductive cough for x2 days.  Endorses rhinorrhea, nonproductive cough, headache, fatigue, malaise and body aches.  Work-up revealed influenza A infection, elevated troponin peaked at 22 and normalized, elevated liver chemistries, electrolyte abnormalities.  He was started on Tamiflu and IV steroids.  06/20/21: States cough is improved this morning.  Has no chest pain.  We will obtain home oxygen evaluation for DC planning.  Anticipate DC to home 06/21/2021, if no events overnight.  Still tachycardic.  Still requiring IV Solu-Medrol.  Assessment/Plan: Principal Problem:   Influenza A Active Problems:   Hypertension   Type 2 diabetes mellitus with complication (HCC)   Hyperlipidemia   Elevated troponin   Alcohol abuse with intoxication ([place])  Influenza A infection Continue Tamiflu day 2 out of 5 Received IV steroids, continue IV Solu-Medrol and wean off as tolerated. Continue nebs Maintaining oxygen saturation greater than 92% Antitussives as needed Bronchodilators Mobilize as tolerated     Elevated troponin Likely demand ischemia. High-sensitivity troponin peaked at 22 Normalized Monitor on telemetry Continue aspirin 81 mg daily and home Lipitor.    Alcohol abuse with intoxication ([place]) Not a daily drinker per patient. No evidence of alcohol withdrawal. Continue multivitamins, thiamine and folic acid     Hypertension, not at goal, elevated. Continue home metoprolol 50 mg p.o. twice daily. Norvasc added 5 mg daily IV antihypertensive as needed with parameters. Monitor blood pressure and heart rate.     Type  2 diabetes mellitus with complication (HCC) Hemoglobin A1c 6.3 on 06/19/2021. Carbohydrate modified diet. Continue insulin sliding scale.     Hyperlipidemia LDL 162 on 08/17/2019 LDL 130 on 06/20/2021 Continue home Lipitor 40 mg daily  Ambulatory dysfunction/Physical debility PT no PT follow-up. Fall precautions.     Code Status: Full code  Family Communication: None at bedside  Disposition Plan: Likely will discharge to home when hemodynamically stable.   Consultants: None  Procedures: None none  Antimicrobials: Tamiflu  DVT prophylaxis: Subcu Lovenox daily  Status is: Inpatient  Inpatient status.  Patient requires at least 2 midnights for further evaluation and treatment of present condition.      Objective: Vitals:   06/19/21 2145 06/20/21 0623 06/20/21 0937 06/20/21 1412  BP: (!) 160/110 (!) 167/115 (!) 157/100 (!) 152/103  Pulse: 82 76 99 90  Resp: 20 20 18 18   Temp: 98 F (36.7 C) 98.2 F (36.8 C) 98.1 F (36.7 C) 98.8 F (37.1 C)  TempSrc: Oral Oral Oral Oral  SpO2: 94% 98% 95% 98%  Weight:      Height:        Intake/Output Summary (Last 24 hours) at 06/20/2021 1511 Last data filed at 06/20/2021 1200 Gross per 24 hour  Intake 480 ml  Output 675 ml  Net -195 ml   Filed Weights   06/18/21 2130  Weight: 133.1 kg    Exam:  General: 55 y.o. year-old male well-developed well-nourished in no acute distress.  He is alert and oriented x3. Cardiovascular: Tachycardic no rubs or gallops.   Respiratory: Mild wheezing.  Good inspiratory effort. Abdomen: Soft nontender normal bowel sounds present.  Musculoskeletal: No lower extremity edema bilaterally.   Skin: No ulcerative  lesions or rashes. Psychiatry: Mood is appropriate for condition and setting.   Data Reviewed: CBC: Recent Labs  Lab 06/18/21 1231 06/20/21 0522  WBC 6.2 9.2  NEUTROABS 1.9  --   HGB 13.6 13.4  HCT 40.5 39.9  MCV 74.6* 74.2*  PLT 200 433   Basic Metabolic  Panel: Recent Labs  Lab 06/18/21 1231 06/18/21 2158 06/20/21 0522  NA 140  --  132*  K 3.8  --  4.5  CL 107  --  98  CO2 24  --  31  GLUCOSE 162*  --  161*  BUN 11  --  19  CREATININE 0.80  --  0.97  CALCIUM 8.4*  --  9.3  MG 1.9  --  2.0  PHOS  --  1.6* 2.5   GFR: Estimated Creatinine Clearance: 119.8 mL/min (by C-G formula based on SCr of 0.97 mg/dL). Liver Function Tests: Recent Labs  Lab 06/18/21 1231 06/20/21 0522  AST 90* 29  ALT 76* 53*  ALKPHOS 76 63  BILITOT 0.2* 0.7  PROT 8.0 7.5  ALBUMIN 3.5 3.4*   No results for input(s): LIPASE, AMYLASE in the last 168 hours. No results for input(s): AMMONIA in the last 168 hours. Coagulation Profile: No results for input(s): INR, PROTIME in the last 168 hours. Cardiac Enzymes: No results for input(s): CKTOTAL, CKMB, CKMBINDEX, TROPONINI in the last 168 hours. BNP (last 3 results) No results for input(s): PROBNP in the last 8760 hours. HbA1C: Recent Labs    06/19/21 0553  HGBA1C 6.3*   CBG: Recent Labs  Lab 06/19/21 1143 06/19/21 1700 06/19/21 2141 06/20/21 0731 06/20/21 1153  GLUCAP 156* 254* 250* 140* 198*   Lipid Profile: Recent Labs    06/20/21 0522  CHOL 223*  HDL 73  LDLCALC 130*  TRIG 99  CHOLHDL 3.1   Thyroid Function Tests: No results for input(s): TSH, T4TOTAL, FREET4, T3FREE, THYROIDAB in the last 72 hours. Anemia Panel: No results for input(s): VITAMINB12, FOLATE, FERRITIN, TIBC, IRON, RETICCTPCT in the last 72 hours. Urine analysis:    Component Value Date/Time   COLORURINE AMBER (A) 09/24/2020 0223   APPEARANCEUR HAZY (A) 09/24/2020 0223   LABSPEC 1.021 09/24/2020 0223   PHURINE 5.0 09/24/2020 0223   GLUCOSEU >=500 (A) 09/24/2020 0223   HGBUR MODERATE (A) 09/24/2020 0223   BILIRUBINUR NEGATIVE 09/24/2020 0223   BILIRUBINUR neg 10/07/2019 0951   KETONESUR 20 (A) 09/24/2020 0223   PROTEINUR 30 (A) 09/24/2020 0223   UROBILINOGEN 0.2 09/15/2020 1032   NITRITE NEGATIVE 09/24/2020  0223   LEUKOCYTESUR NEGATIVE 09/24/2020 0223   Sepsis Labs: @LABRCNTIP (procalcitonin:4,lacticidven:4)  ) Recent Results (from the past 240 hour(s))  Resp Panel by RT-PCR (Flu A&B, Covid) Nasopharyngeal Swab     Status: Abnormal   Collection Time: 06/18/21  1:33 PM   Specimen: Nasopharyngeal Swab; Nasopharyngeal(NP) swabs in vial transport medium  Result Value Ref Range Status   SARS Coronavirus 2 by RT PCR NEGATIVE NEGATIVE Final    Comment: (NOTE) SARS-CoV-2 target nucleic acids are NOT DETECTED.  The SARS-CoV-2 RNA is generally detectable in upper respiratory specimens during the acute phase of infection. The lowest concentration of SARS-CoV-2 viral copies this assay can detect is 138 copies/mL. A negative result does not preclude SARS-Cov-2 infection and should not be used as the sole basis for treatment or other patient management decisions. A negative result may occur with  improper specimen collection/handling, submission of specimen other than nasopharyngeal swab, presence of viral mutation(s) within the areas  targeted by this assay, and inadequate number of viral copies(<138 copies/mL). A negative result must be combined with clinical observations, patient history, and epidemiological information. The expected result is Negative.  Fact Sheet for Patients:  EntrepreneurPulse.com.au  Fact Sheet for Healthcare Providers:  IncredibleEmployment.be  This test is no t yet approved or cleared by the [place] FDA and  has been authorized for detection and/or diagnosis of SARS-CoV-2 by FDA under an Emergency Use Authorization (EUA). This EUA will remain  in effect (meaning this test can be used) for the duration of the COVID-19 declaration under Section 564(b)(1) of the Act, 21 U.S.C.section 360bbb-3(b)(1), unless the authorization is terminated  or revoked sooner.       Influenza A by PCR POSITIVE (A) NEGATIVE Final   Influenza B by  PCR NEGATIVE NEGATIVE Final    Comment: (NOTE) The Xpert Xpress SARS-CoV-2/FLU/RSV plus assay is intended as an aid in the diagnosis of influenza from Nasopharyngeal swab specimens and should not be used as a sole basis for treatment. Nasal washings and aspirates are unacceptable for Xpert Xpress SARS-CoV-2/FLU/RSV testing.  Fact Sheet for Patients: EntrepreneurPulse.com.au  Fact Sheet for Healthcare Providers: IncredibleEmployment.be  This test is not yet approved or cleared by the [place] FDA and has been authorized for detection and/or diagnosis of SARS-CoV-2 by FDA under an Emergency Use Authorization (EUA). This EUA will remain in effect (meaning this test can be used) for the duration of the COVID-19 declaration under Section 564(b)(1) of the Act, 21 U.S.C. section 360bbb-3(b)(1), unless the authorization is terminated or revoked.  Performed at [facility], [place] [address], [place], [place] [address]       Studies: No results found.  Scheduled Meds:  amLODipine  5 mg Oral Daily   aspirin EC  81 mg Oral Daily   atorvastatin  40 mg Oral Daily   folic acid  1 mg Oral Daily   insulin aspart  0-15 Units Subcutaneous TID WC   metoprolol tartrate  50 mg Oral BID   multivitamin with minerals  1 tablet Oral Daily   oseltamivir  75 mg Oral BID   thiamine  100 mg Oral Daily    Continuous Infusions:   LOS: 1 day     Kayleen Memos, MD [facility] Pager [phone]  If 7PM-7AM, please contact night-coverage [email or web address] Password [email or web address] 06/20/2021, 3:11 PM

## 2021-06-20 NOTE — Plan of Care (Signed)
Riley Hood sat up in the chair much of today. He ambulated in the hall and maintained his O2 sat >90% though he did become tachycardic to the 130's. He denied any palpitations or chest discomfort. Productive cough for clear/white sputum. Occasional exp wheezing.  Problem: Education: Goal: Knowledge of General Education information will improve Description: Including pain rating scale, medication(s)/side effects and non-pharmacologic comfort measures Outcome: Progressing   Problem: Health Behavior/Discharge Planning: Goal: Ability to manage health-related needs will improve Outcome: Progressing   Problem: Clinical Measurements: Goal: Ability to maintain clinical measurements within normal limits will improve Outcome: Progressing Goal: Will remain free from infection Outcome: Progressing Goal: Diagnostic test results will improve Outcome: Progressing Goal: Respiratory complications will improve Outcome: Progressing Goal: Cardiovascular complication will be avoided Outcome: Progressing   Problem: Activity: Goal: Risk for activity intolerance will decrease Outcome: Progressing   Problem: Nutrition: Goal: Adequate nutrition will be maintained Outcome: Progressing   Problem: Safety: Goal: Ability to remain free from injury will improve Outcome: Progressing

## 2021-06-21 ENCOUNTER — Other Ambulatory Visit: Payer: Self-pay

## 2021-06-21 LAB — GLUCOSE, CAPILLARY
Glucose-Capillary: 167 mg/dL — ABNORMAL HIGH (ref 70–99)
Glucose-Capillary: 168 mg/dL — ABNORMAL HIGH (ref 70–99)

## 2021-06-21 MED ORDER — ADULT MULTIVITAMIN W/MINERALS CH
1.0000 | ORAL_TABLET | Freq: Every day | ORAL | 0 refills | Status: AC
  Filled 2021-06-21: qty 90, 90d supply, fill #0

## 2021-06-21 MED ORDER — OSELTAMIVIR PHOSPHATE 75 MG PO CAPS
75.0000 mg | ORAL_CAPSULE | Freq: Two times a day (BID) | ORAL | 0 refills | Status: AC
  Filled 2021-06-21: qty 10, 5d supply, fill #0
  Filled 2021-06-21: qty 4, 2d supply, fill #0

## 2021-06-21 MED ORDER — ATORVASTATIN CALCIUM 40 MG PO TABS: 40.0000 mg | ORAL_TABLET | Freq: Every day | ORAL | 0 refills | Status: DC

## 2021-06-21 MED ORDER — ALBUTEROL SULFATE HFA 108 (90 BASE) MCG/ACT IN AERS
2.0000 | INHALATION_SPRAY | Freq: Four times a day (QID) | RESPIRATORY_TRACT | 0 refills | Status: AC | PRN
  Filled 2021-06-21: qty 18, 25d supply, fill #0

## 2021-06-21 MED ORDER — ATORVASTATIN CALCIUM 40 MG PO TABS
40.0000 mg | ORAL_TABLET | Freq: Every day | ORAL | 0 refills | Status: DC
  Filled 2021-06-21: qty 30, 30d supply, fill #0

## 2021-06-21 MED ORDER — THIAMINE HCL 100 MG PO TABS
100.0000 mg | ORAL_TABLET | Freq: Every day | ORAL | 0 refills | Status: AC
  Filled 2021-06-21: qty 90, 90d supply, fill #0

## 2021-06-21 MED ORDER — KETOROLAC TROMETHAMINE 15 MG/ML IJ SOLN
15.0000 mg | INTRAMUSCULAR | Status: AC
  Administered 2021-06-21: 15 mg via INTRAVENOUS
  Filled 2021-06-21: qty 1

## 2021-06-21 MED ORDER — METOPROLOL TARTRATE 50 MG PO TABS
50.0000 mg | ORAL_TABLET | Freq: Two times a day (BID) | ORAL | 0 refills | Status: DC
Start: 2021-06-21 — End: 2021-06-29
  Filled 2021-06-21: qty 60, 30d supply, fill #0

## 2021-06-21 MED ORDER — ASPIRIN 81 MG PO TBEC
81.0000 mg | DELAYED_RELEASE_TABLET | Freq: Every day | ORAL | 0 refills | Status: AC
  Filled 2021-06-21: qty 180, 180d supply, fill #0

## 2021-06-21 MED ORDER — FOLIC ACID 1 MG PO TABS
1.0000 mg | ORAL_TABLET | Freq: Every day | ORAL | 0 refills | Status: AC
Start: 2021-06-21 — End: 2021-10-16
  Filled 2021-06-21 – 2021-08-03 (×2): qty 30, 30d supply, fill #0
  Filled 2021-09-13: qty 30, 30d supply, fill #1

## 2021-06-21 MED ORDER — BENZONATATE 100 MG PO CAPS
100.0000 mg | ORAL_CAPSULE | Freq: Three times a day (TID) | ORAL | 0 refills | Status: DC
  Filled 2021-06-21: qty 20, 7d supply, fill #0

## 2021-06-21 MED ORDER — AMLODIPINE BESYLATE 5 MG PO TABS
5.0000 mg | ORAL_TABLET | Freq: Every day | ORAL | 0 refills | Status: DC
  Filled 2021-06-21: qty 90, 90d supply, fill #0

## 2021-06-21 NOTE — Plan of Care (Signed)
  Problem: Clinical Measurements: Goal: Will remain free from infection Outcome: Adequate for Discharge Goal: Respiratory complications will improve Outcome: Adequate for Discharge   Problem: Activity: Goal: Risk for activity intolerance will decrease Outcome: Adequate for Discharge   

## 2021-06-21 NOTE — Progress Notes (Signed)
DC instructions reviewed w/ pt. Pt verbalizes understanding, all questions answered. Pt in possession of d/c packet and all personal belongings. Pt's dtr is on her way to pick pt up for ride home. Pt stable at this time.

## 2021-06-21 NOTE — Discharge Summary (Addendum)
Discharge Summary  Riley Hood [id number] DOB: Mar 23, 1966  PCP: Elsie Stain, MD  Admit date: 06/18/2021 Discharge date: 06/21/2021  Time spent: 35 minutes  Recommendations for Outpatient Follow-up:  Follow-up with your PCP Follow-up with your cardiologist Take your medications as prescribed  Discharge Diagnoses:  Active Hospital Problems   Diagnosis Date Noted   Influenza A 06/18/2021   Elevated troponin 06/18/2021   Alcohol abuse with intoxication ([place]) 06/18/2021   Hyperlipidemia 09/16/2020   Type 2 diabetes mellitus with complication [facility]) 56/08/7827   Hypertension 05/25/2015    Resolved Hospital Problems  No resolved problems to display.    Discharge Condition: Stable  Diet recommendation: Resume previous diet  Vitals:   06/21/21 0851 06/21/21 1400  BP: (!) 158/101 (!) 159/102  Pulse: 97 91  Resp:  20  Temp: 98.2 F (36.8 C) 98.1 F (36.7 C)  SpO2: 94% 97%    History of present illness:  Riley Hood is a 55 y.o. male with medical history significant of type II DM, unspecified discitis, hyperlipidemia, hypertension who presented to [facility] ED due to progressively worsening dyspnea associated with wheezing and a nonproductive cough for x2 days.  Endorses rhinorrhea, nonproductive cough, headache, fatigue, malaise and body aches.  Work-up revealed influenza A infection, elevated troponin peaked at 22 and normalized, elevated liver chemistries, electrolyte abnormalities.  He received Tamiflu and IV steroids.   06/21/21: Seen at bedside.  No acute events overnight.  He passed home oxygen evaluation.  Oxygen saturation 97% on room air.  Hospital Course:  Principal Problem:   Influenza A Active Problems:   Hypertension   Type 2 diabetes mellitus with complication (HCC)   Hyperlipidemia   Elevated troponin   Alcohol abuse with intoxication (HCC)  Influenza A infection Complete 5 days of Tamiflu. Tessalon Perles Bronchodilators Mobilize as  tolerated   Resolved elevated troponin Likely demand ischemia. Denies any anginal symptoms. Continue aspirin and Lipitor.    Alcohol abuse with intoxication ([place]) Was on CIWA protocol No evidence of alcohol withdrawal at time of this visit. Continue multivitamins, thiamine and folic acid     Hypertension. Continue home metoprolol 50 mg p.o. twice daily. Norvasc added 5 mg daily Follow-up with your PCP     Type 2 diabetes mellitus with hyperglycemia Resume home regimen     Hyperlipidemia LDL 130 on 06/20/2021, goal less than 70. Continue home Lipitor 40 mg daily Follow-up with your PCP and cardiology  Resolved hypophosphatemia post repletion  Morbid obesity BMI 40 Recommend weight loss outpatient with regular physical activity and healthy dieting.    Code Status: Full code      Consultants: None   Procedures: None none   Antimicrobials: Tamiflu     Discharge Exam: BP (!) 159/102 (BP Location: Right Arm)   Pulse 91   Temp 98.1 F (36.7 C) (Oral)   Resp 20   Ht 5' 11" (1.803 m)   Wt 133.1 kg   SpO2 97%   BMI 40.93 kg/m  General: 55 y.o. year-old male well developed well nourished in no acute distress.  Alert and oriented x3. Cardiovascular: Regular rate and rhythm with no rubs or gallops.  No thyromegaly or JVD noted.   Respiratory: Clear to auscultation with no wheezes or rales. Good inspiratory effort. Abdomen: Soft nontender nondistended with normal bowel sounds x4 quadrants. Musculoskeletal: No lower extremity edema. 2/4 pulses in all 4 extremities. Skin: No ulcerative lesions noted or rashes, Psychiatry: Mood is appropriate for condition and setting  Discharge Instructions You were cared for by a hospitalist during your hospital stay. If you have any questions about your discharge medications or the care you received while you were in the hospital after you are discharged, you can call the unit and asked to speak with the hospitalist on call if the  hospitalist that took care of you is not available. Once you are discharged, your primary care physician will handle any further medical issues. Please note that NO REFILLS for any discharge medications will be authorized once you are discharged, as it is imperative that you return to your primary care physician (or establish a relationship with a primary care physician if you do not have one) for your aftercare needs so that they can reassess your need for medications and monitor your lab values.   Allergies as of 06/21/2021   No Known Allergies      Medication List     STOP taking these medications    acetaminophen 650 MG suppository Commonly known as: TYLENOL   FeroSul 325 (65 FE) MG tablet Generic drug: ferrous sulfate   gabapentin 400 MG capsule Commonly known as: NEURONTIN   HYDROcodone-acetaminophen 5-325 MG tablet Commonly known as: NORCO/VICODIN   ibuprofen 200 MG tablet Commonly known as: ADVIL   methocarbamol 500 MG tablet Commonly known as: ROBAXIN   MiraLax 17 g packet Generic drug: polyethylene glycol   senna-docusate 8.6-50 MG tablet Commonly known as: Senokot-S   VITAMIN D PO   VITAMIN E PO       TAKE these medications    albuterol 108 (90 Base) MCG/ACT inhaler Commonly known as: VENTOLIN HFA Inhale 2 puffs into the lungs every 6 (six) hours as needed for wheezing or shortness of breath.   amLODipine 5 MG tablet Commonly known as: NORVASC Take 1 tablet (5 mg total) by mouth daily.   aspirin 81 MG EC tablet Take 1 tablet (81 mg total) by mouth daily. Swallow whole.   atorvastatin 40 MG tablet Commonly known as: LIPITOR Take 1 tablet (40 mg total) by mouth daily.   Basaglar KwikPen 100 UNIT/ML Inject 34 Units into the skin daily. What changed: when to take this   benzonatate 100 MG capsule Commonly known as: TESSALON Take 1 capsule (100 mg total) by mouth 3 (three) times daily.   blood glucose meter kit and supplies Kit Dispense based  on patient and insurance preference. Use up to four times daily as directed. (FOR ICD-9 250.00, 250.01).   folic acid 1 MG tablet Commonly known as: FOLVITE Take 1 tablet (1 mg total) by mouth daily.   metoprolol tartrate 50 MG tablet Commonly known as: LOPRESSOR Take 1 tablet (50 mg total) by mouth 2 (two) times daily. What changed: how much to take   multivitamin with minerals Tabs tablet Take 1 tablet by mouth daily.   NovoLOG 100 UNIT/ML injection Generic drug: insulin aspart INJECT 3-20 UNITS INTO THE SKIN 3 TIMES DAILY AS DIRECTED ON SHEET What changed:  how much to take how to take this when to take this Another medication with the same name was removed. Continue taking this medication, and follow the directions you see here.   oseltamivir 75 MG capsule Commonly known as: TAMIFLU Take 1 capsule (75 mg total) by mouth 2 (two) times daily for 2 days discard remaining tablets after 2 days   THERAFLU FLU/COLD/COUGH PO Take 1 tablet by mouth every 6 (six) hours as needed (for flu-like symptoms).   thiamine 100 MG tablet Take  1 tablet (100 mg total) by mouth daily.   Tylenol Cold/Flu Day 30-500-15 MG Tabs Generic drug: Pseudoephedrine-APAP-DM Take 1 tablet by mouth every 6 (six) hours as needed (for flu-like symptoms).   vitamin B-12 250 MCG tablet Commonly known as: CYANOCOBALAMIN Take 250 mcg by mouth daily.       No Known Allergies  Follow-up Information     Storm Frisk, MD. Call today.   Specialty: Pulmonary Disease Why: Please call for a post hospital follow-up appointment. Contact information: [address] [place] [address] [phone]         Parke Poisson, MD .   Specialties: Cardiology, Radiology Contact information: [address] [place] [address] [place] [address] [phone]                  The results of significant diagnostics from this hospitalization (including imaging, microbiology, ancillary and  laboratory) are listed below for reference.    Significant Diagnostic Studies: DG Chest Portable 1 View  Result Date: 06/18/2021 CLINICAL DATA:  Shortness of breath. Additional history provided: Shortness of breath for 2 days, history of asthma. EXAM: PORTABLE CHEST 1 VIEW COMPARISON:  Prior chest radiograph 09/23/2020 and earlier. FINDINGS: Heart size at the upper limits of normal, unchanged. Peribronchial thickening. Mild ill-defined opacity within the perihilar left lung and bilateral lung bases. No evidence of pleural effusion or pneumothorax. No acute bony abnormality identified. Redemonstrated punctate metallic foreign body within the left lung base. IMPRESSION: Peribronchial thickening, a finding which may be seen in the setting of viral infection or reactive airway disease/asthma. Ill-defined opacity within the perihilar left lung and bilateral lung bases, which may reflect atelectasis and/or pneumonia. Electronically Signed   By: Jackey Loge D.O.   On: 06/18/2021 12:54    Microbiology: Recent Results (from the past 240 hour(s))  Resp Panel by RT-PCR (Flu A&B, Covid) Nasopharyngeal Swab     Status: Abnormal   Collection Time: 06/18/21  1:33 PM   Specimen: Nasopharyngeal Swab; Nasopharyngeal(NP) swabs in vial transport medium  Result Value Ref Range Status   SARS Coronavirus 2 by RT PCR NEGATIVE NEGATIVE Final    Comment: (NOTE) SARS-CoV-2 target nucleic acids are NOT DETECTED.  The SARS-CoV-2 RNA is generally detectable in upper respiratory specimens during the acute phase of infection. The lowest concentration of SARS-CoV-2 viral copies this assay can detect is 138 copies/mL. A negative result does not preclude SARS-Cov-2 infection and should not be used as the sole basis for treatment or other patient management decisions. A negative result may occur with  improper specimen collection/handling, submission of specimen other than nasopharyngeal swab, presence of viral mutation(s)  within the areas targeted by this assay, and inadequate number of viral copies(<138 copies/mL). A negative result must be combined with clinical observations, patient history, and epidemiological information. The expected result is Negative.  Fact Sheet for Patients:  BloggerCourse.com  Fact Sheet for Healthcare Providers:  SeriousBroker.it  This test is no t yet approved or cleared by the [place] FDA and  has been authorized for detection and/or diagnosis of SARS-CoV-2 by FDA under an Emergency Use Authorization (EUA). This EUA will remain  in effect (meaning this test can be used) for the duration of the COVID-19 declaration under Section 564(b)(1) of the Act, 21 U.S.C.section 360bbb-3(b)(1), unless the authorization is terminated  or revoked sooner.       Influenza A by PCR POSITIVE (A) NEGATIVE Final   Influenza B by PCR NEGATIVE NEGATIVE Final  Comment: (NOTE) The Xpert Xpress SARS-CoV-2/FLU/RSV plus assay is intended as an aid in the diagnosis of influenza from Nasopharyngeal swab specimens and should not be used as a sole basis for treatment. Nasal washings and aspirates are unacceptable for Xpert Xpress SARS-CoV-2/FLU/RSV testing.  Fact Sheet for Patients: EntrepreneurPulse.com.au  Fact Sheet for Healthcare Providers: IncredibleEmployment.be  This test is not yet approved or cleared by the [place] FDA and has been authorized for detection and/or diagnosis of SARS-CoV-2 by FDA under an Emergency Use Authorization (EUA). This EUA will remain in effect (meaning this test can be used) for the duration of the COVID-19 declaration under Section 564(b)(1) of the Act, 21 U.S.C. section 360bbb-3(b)(1), unless the authorization is terminated or revoked.  Performed at [facility], [place] [address] [place], [place] [address]      Labs: Basic Metabolic  Panel: Recent Labs  Lab 06/18/21 1231 06/18/21 2158 06/20/21 0522  NA 140  --  132*  K 3.8  --  4.5  CL 107  --  98  CO2 24  --  31  GLUCOSE 162*  --  161*  BUN 11  --  19  CREATININE 0.80  --  0.97  CALCIUM 8.4*  --  9.3  MG 1.9  --  2.0  PHOS  --  1.6* 2.5   Liver Function Tests: Recent Labs  Lab 06/18/21 1231 06/20/21 0522  AST 90* 29  ALT 76* 53*  ALKPHOS 76 63  BILITOT 0.2* 0.7  PROT 8.0 7.5  ALBUMIN 3.5 3.4*   No results for input(s): LIPASE, AMYLASE in the last 168 hours. No results for input(s): AMMONIA in the last 168 hours. CBC: Recent Labs  Lab 06/18/21 1231 06/20/21 0522  WBC 6.2 9.2  NEUTROABS 1.9  --   HGB 13.6 13.4  HCT 40.5 39.9  MCV 74.6* 74.2*  PLT 200 219   Cardiac Enzymes: No results for input(s): CKTOTAL, CKMB, CKMBINDEX, TROPONINI in the last 168 hours. BNP: BNP (last 3 results) No results for input(s): BNP in the last 8760 hours.  ProBNP (last 3 results) No results for input(s): PROBNP in the last 8760 hours.  CBG: Recent Labs  Lab 06/20/21 1153 06/20/21 1703 06/20/21 2112 06/21/21 0749 06/21/21 1135  GLUCAP 198* 254* 307* 167* 168*       Signed:  Kayleen Memos, MD [facility] 06/21/2021, 5:45 PM

## 2021-06-21 NOTE — TOC Transition Note (Signed)
Transition of Care [facility]) - CM/SW Discharge Note   Patient Details  Name: Riley Hood MRN: [id number] Date of Birth: [birth year]  Transition of Care [facility]) CM/SW Contact:  Dessa Phi, RN Phone Number: 06/21/2021, 1:10 PM   Clinical Narrative: d/c home no orders or CM needs.      Final next level of care: Home/Self Care Barriers to Discharge: No Barriers Identified   Patient Goals and CMS Choice        Discharge Placement                       Discharge Plan and Services                                     Social Determinants of Health (SDOH) Interventions     Readmission Risk Interventions No flowsheet data found.

## 2021-06-22 ENCOUNTER — Telehealth: Payer: Self-pay

## 2021-06-22 NOTE — Telephone Encounter (Signed)
Transition Care Management Follow-up Telephone Call Date of discharge and from where: 06/21/2021, [facility]  How have you been since you were released from the hospital? He said his back is hurting him from " muscle spasms that come and go."  Any questions or concerns? Yes - noted above. He is uninsured and has been denied Medicaid twice. May benefit from referral to [employer] [facility] [place].   Items Reviewed: Did the pt receive and understand the discharge instructions provided? Yes  Medications obtained and verified? Yes - he said he has all medications however he does not have a glucometer to check blood sugars.  When asked how much novolog he gives, he said he gives 20 units.  The order is for 3-20 units three times daily "as directed on sheet." He did not have any questions about his med regime   Other? No  Any new allergies since your discharge? No  Dietary orders reviewed? Yes Do you have support at home? Yes ,he lives with his son.  Home Care and Equipment/Supplies: Were home health services ordered? no If so, what is the name of the agency? N/a  Has the agency set up a time to come to the patient's home? not applicable Were any new equipment or medical supplies ordered?  No What is the name of the medical supply agency? N/a Were you able to get the supplies/equipment? not applicable Do you have any questions related to the use of the equipment or supplies? No  Functional Questionnaire: (I = Independent and D = Dependent) ADLs: independent, has walker to use as needed   Follow up appointments reviewed:  PCP Hospital f/u appt confirmed? Yes  Scheduled to see Dr Joya Gaskins on 06/29/2021 @ 0930. [facility] f/u appt confirmed?  None scheduled at this time.    Are transportation arrangements needed? No , he said his daughter will bring him to the appointment next week. Instructed him to call this office if his daughter is not able to bring him and [facility] can be  arranged.  If their condition worsens, is the pt aware to call PCP or go to the Emergency Dept.? Yes Was the patient provided with contact information for the PCP's office or ED? Yes Was to pt encouraged to call back with questions or concerns? Yes

## 2021-06-29 ENCOUNTER — Encounter: Payer: Self-pay | Admitting: Critical Care Medicine

## 2021-06-29 ENCOUNTER — Other Ambulatory Visit: Payer: Self-pay

## 2021-06-29 ENCOUNTER — Ambulatory Visit: Payer: Self-pay | Attending: Critical Care Medicine | Admitting: Critical Care Medicine

## 2021-06-29 VITALS — BP 153/96 | HR 90 | Resp 16

## 2021-06-29 DIAGNOSIS — M5442 Lumbago with sciatica, left side: Secondary | ICD-10-CM

## 2021-06-29 DIAGNOSIS — E118 Type 2 diabetes mellitus with unspecified complications: Secondary | ICD-10-CM

## 2021-06-29 DIAGNOSIS — Z23 Encounter for immunization: Secondary | ICD-10-CM

## 2021-06-29 DIAGNOSIS — F10129 Alcohol abuse with intoxication, unspecified: Secondary | ICD-10-CM

## 2021-06-29 DIAGNOSIS — G8929 Other chronic pain: Secondary | ICD-10-CM

## 2021-06-29 DIAGNOSIS — E782 Mixed hyperlipidemia: Secondary | ICD-10-CM

## 2021-06-29 DIAGNOSIS — M25562 Pain in left knee: Secondary | ICD-10-CM

## 2021-06-29 DIAGNOSIS — Z1211 Encounter for screening for malignant neoplasm of colon: Secondary | ICD-10-CM

## 2021-06-29 LAB — GLUCOSE, POCT (MANUAL RESULT ENTRY): POC Glucose: 137 mg/dl — AB (ref 70–99)

## 2021-06-29 MED ORDER — ATORVASTATIN CALCIUM 40 MG PO TABS
40.0000 mg | ORAL_TABLET | Freq: Every day | ORAL | 0 refills | Status: AC
  Filled 2021-06-29: qty 90, 90d supply, fill #0
  Filled 2021-08-03: qty 30, 30d supply, fill #0
  Filled 2021-09-13: qty 30, 30d supply, fill #1

## 2021-06-29 MED ORDER — NOVOLOG FLEXPEN 100 UNIT/ML ~~LOC~~ SOPN
8.0000 [IU] | PEN_INJECTOR | Freq: Three times a day (TID) | SUBCUTANEOUS | 6 refills | Status: DC
  Filled 2021-06-29 – 2021-08-03 (×2): qty 6, 25d supply, fill #0
  Filled 2021-09-13: qty 6, 25d supply, fill #1

## 2021-06-29 MED ORDER — BASAGLAR KWIKPEN 100 UNIT/ML ~~LOC~~ SOPN
34.0000 [IU] | PEN_INJECTOR | Freq: Every day | SUBCUTANEOUS | 6 refills | Status: AC
Start: 2021-06-29 — End: ?
  Filled 2021-06-29 – 2021-08-03 (×2): qty 12, 35d supply, fill #0
  Filled 2021-09-13: qty 12, 35d supply, fill #1

## 2021-06-29 MED ORDER — TRUE METRIX BLOOD GLUCOSE TEST VI STRP
ORAL_STRIP | 12 refills | Status: AC
  Filled 2021-06-29: qty 100, 25d supply, fill #0

## 2021-06-29 MED ORDER — TRUEPLUS LANCETS 28G MISC
1 refills | Status: AC
  Filled 2021-06-29: qty 100, 25d supply, fill #0

## 2021-06-29 MED ORDER — TRUE METRIX METER W/DEVICE KIT
PACK | 0 refills | Status: AC
  Filled 2021-06-29: qty 1, 1d supply, fill #0

## 2021-06-29 MED ORDER — AMLODIPINE BESYLATE 10 MG PO TABS
10.0000 mg | ORAL_TABLET | Freq: Every day | ORAL | 0 refills | Status: AC
  Filled 2021-06-29 – 2021-08-03 (×2): qty 30, 30d supply, fill #0
  Filled 2021-09-13: qty 30, 30d supply, fill #1

## 2021-06-29 MED ORDER — CARVEDILOL 25 MG PO TABS
25.0000 mg | ORAL_TABLET | Freq: Two times a day (BID) | ORAL | 3 refills | Status: AC
Start: 2021-06-29 — End: ?
  Filled 2021-06-29 – 2021-08-03 (×2): qty 60, 30d supply, fill #0
  Filled 2021-09-13: qty 60, 30d supply, fill #1

## 2021-06-29 NOTE — Assessment & Plan Note (Signed)
History of alcohol abuse with intoxication currently off alcohol is counseled to further abstain
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
## 2021-06-29 NOTE — Assessment & Plan Note (Signed)
Continue statin therapy.

## 2021-06-29 NOTE — Assessment & Plan Note (Signed)
Persisting low back pain with fairly normal MRI of the lumbar spine and associated left knee arthritis  Referral to orthopedics was made

## 2021-06-29 NOTE — Assessment & Plan Note (Signed)
Referral to orthopedics made 

## 2021-06-29 NOTE — Patient Instructions (Signed)
Stop metoprolol and begin carvedilol 1 pill twice daily for blood pressure, prescription sent to our pharmacy  Increase amlodipine to 10 mg daily, you can take 2 of the 5 mg tablets daily till it is gone but the refill will be a 10 mg tablet to take 1 daily  No other medication changes  Referral to physical therapy and orthopedic surgery was made  Please refrain from alcohol use and continue her vitamin supplements  Refills on your insulin products made no change in dosing except he will take 8 units of the NovoLog 3 times a day before meals  New glucose testing supplies were given at the pharmacy  Use the walker to ambulate and bring it with you to the physical therapy appointment  Flu vaccine was given  Pick up a colon cancer screening kit when you go to the lab  Return to see Dr. Joya Gaskins in 2 months and see Lurena Joiner our clinical pharmacist in 3 weeks to follow-up with your blood pressure

## 2021-06-30 LAB — BASIC METABOLIC PANEL
BUN/Creatinine Ratio: 14 (ref 9–20)
BUN: 13 mg/dL (ref 6–24)
CO2: 24 mmol/L (ref 20–29)
Calcium: 9.8 mg/dL (ref 8.7–10.2)
Chloride: 101 mmol/L (ref 96–106)
Creatinine, Ser: 0.91 mg/dL (ref 0.76–1.27)
Glucose: 129 mg/dL — ABNORMAL HIGH (ref 70–99)
Potassium: 4.4 mmol/L (ref 3.5–5.2)
Sodium: 140 mmol/L (ref 134–144)
eGFR: 100 mL/min/{1.73_m2} (ref >59)

## 2021-06-30 LAB — MICROALBUMIN / CREATININE URINE RATIO
Creatinine, Urine: 368.3 mg/dL
Microalb/Creat Ratio: 9 mg/g creat (ref 0–29)
Microalbumin, Urine: 32 ug/mL

## 2021-07-01 ENCOUNTER — Telehealth: Payer: Self-pay

## 2021-07-01 NOTE — Telephone Encounter (Signed)
Pt was called and vm was left, Information has been sent to nurse pool.   

## 2021-07-01 NOTE — Telephone Encounter (Signed)
-----   Message from Elsie Stain, MD sent at 07/01/2021  6:20 AM EST ----- Let pt know urine is normal, no protein,    kidney normal

## 2021-07-15 ENCOUNTER — Ambulatory Visit: Payer: Self-pay | Attending: Critical Care Medicine

## 2021-07-27 ENCOUNTER — Ambulatory Visit: Payer: Self-pay | Admitting: Orthopaedic Surgery

## 2021-07-27 ENCOUNTER — Ambulatory Visit: Payer: Self-pay | Admitting: Pharmacist

## 2021-08-03 ENCOUNTER — Other Ambulatory Visit: Payer: Self-pay

## 2021-08-04 ENCOUNTER — Other Ambulatory Visit: Payer: Self-pay

## 2021-08-06 ENCOUNTER — Telehealth: Payer: Self-pay | Admitting: Critical Care Medicine

## 2021-08-06 ENCOUNTER — Other Ambulatory Visit: Payer: Self-pay

## 2021-08-06 NOTE — Telephone Encounter (Signed)
Called pt and left vm to let him him know that he has some refills on both pen

## 2021-08-06 NOTE — Telephone Encounter (Signed)
Copied from [place] [id number]. Topic: General - Other >> Aug 06, 2021 10:10 AM Yvette Rack wrote: Reason for CRM: Pt stated he needs Rx for the needles for both insulin aspart (NOVOLOG FLEXPEN) 100 UNIT/ML FlexPen and Insulin Glargine (BASAGLAR KWIKPEN) 100 UNIT/ML.

## 2021-08-10 ENCOUNTER — Ambulatory Visit: Payer: Self-pay | Admitting: Pharmacist

## 2021-08-18 ENCOUNTER — Other Ambulatory Visit: Payer: Self-pay

## 2021-08-31 ENCOUNTER — Ambulatory Visit: Payer: Self-pay | Admitting: Critical Care Medicine
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
## 2021-09-13 ENCOUNTER — Other Ambulatory Visit: Payer: Self-pay | Admitting: Pharmacist

## 2021-09-13 ENCOUNTER — Other Ambulatory Visit: Payer: Self-pay

## 2021-09-13 MED ORDER — INSULIN LISPRO (1 UNIT DIAL) 100 UNIT/ML (KWIKPEN)
8.0000 [IU] | PEN_INJECTOR | Freq: Three times a day (TID) | SUBCUTANEOUS | 11 refills | Status: AC
  Filled 2021-09-13: qty 6, 25d supply, fill #0

## 2021-09-16 ENCOUNTER — Other Ambulatory Visit: Payer: Self-pay

## 2021-09-30 ENCOUNTER — Telehealth (INDEPENDENT_AMBULATORY_CARE_PROVIDER_SITE_OTHER): Payer: Self-pay | Admitting: Critical Care Medicine

## 2021-09-30 NOTE — Telephone Encounter (Signed)
Pls send notice to med records the patient is deceased ? ?I filled out death certificate and spoke to the funeral home ?

## 2021-09-30 NOTE — Telephone Encounter (Signed)
Copied from [place] [id number]. Topic: General - Other ?>> Sep 28, 2021 11:34 AM Bayard Beaver wrote: ?Reason for CRM: Butch Penny called in from [place], states pt death was 2022-10-24. ?

## 2021-09-30 NOTE — Telephone Encounter (Signed)
Fyi.

## 2021-09-30 NOTE — Telephone Encounter (Signed)
Can you help?

## 2021-10-01 NOTE — Telephone Encounter (Signed)
Flagged chart as deceased per MD message.  ?

## 2021-10-16 DEATH — deceased

## 2022-08-26 ENCOUNTER — Other Ambulatory Visit (HOSPITAL_COMMUNITY): Payer: Self-pay
